# Patient Record
Sex: Female | Born: 1960
Health system: Southern US, Community
[De-identification: ages and names within clinical notes are randomized; demographics above are authoritative.]

## PROBLEM LIST (undated history)

## (undated) DIAGNOSIS — E039 Hypothyroidism, unspecified: Secondary | ICD-10-CM

## (undated) DIAGNOSIS — G4733 Obstructive sleep apnea (adult) (pediatric): Secondary | ICD-10-CM

## (undated) DIAGNOSIS — E119 Type 2 diabetes mellitus without complications: Secondary | ICD-10-CM

## (undated) DIAGNOSIS — E785 Hyperlipidemia, unspecified: Secondary | ICD-10-CM

## (undated) DIAGNOSIS — I10 Essential (primary) hypertension: Secondary | ICD-10-CM

## (undated) HISTORY — PX: SHOULDER SURGERY: SHX246

## (undated) HISTORY — DX: Type 2 diabetes mellitus without complications: E11.9

## (undated) HISTORY — DX: Essential (primary) hypertension: I10

## (undated) HISTORY — PX: TOTAL ABDOMINAL HYSTERECTOMY: SHX209

## (undated) HISTORY — DX: Hyperlipidemia, unspecified: E78.5

## (undated) HISTORY — DX: Hypothyroidism, unspecified: E03.9

## (undated) HISTORY — DX: Obstructive sleep apnea (adult) (pediatric): G47.33

## (undated) HISTORY — PX: ABDOMINAL HYSTERECTOMY: SHX81

---

## 1998-01-04 ENCOUNTER — Ambulatory Visit: Admission: RE | Admit: 1998-01-04 | Discharge: 1998-01-04 | Payer: Self-pay | Admitting: Obstetrics and Gynecology

## 1998-06-06 ENCOUNTER — Ambulatory Visit (HOSPITAL_COMMUNITY): Admission: RE | Admit: 1998-06-06 | Discharge: 1998-06-06 | Payer: Self-pay | Admitting: Obstetrics and Gynecology

## 1998-08-02 ENCOUNTER — Encounter: Payer: Self-pay | Admitting: Obstetrics and Gynecology

## 1998-08-02 ENCOUNTER — Ambulatory Visit (HOSPITAL_COMMUNITY): Admission: RE | Admit: 1998-08-02 | Discharge: 1998-08-02 | Payer: Self-pay | Admitting: Obstetrics and Gynecology

## 1999-04-30 ENCOUNTER — Other Ambulatory Visit: Admission: RE | Admit: 1999-04-30 | Discharge: 1999-04-30 | Payer: Self-pay | Admitting: Obstetrics and Gynecology

## 1999-06-27 ENCOUNTER — Other Ambulatory Visit: Admission: RE | Admit: 1999-06-27 | Discharge: 1999-06-27 | Payer: Self-pay | Admitting: Obstetrics and Gynecology

## 1999-06-27 ENCOUNTER — Encounter (INDEPENDENT_AMBULATORY_CARE_PROVIDER_SITE_OTHER): Payer: Self-pay

## 1999-07-23 ENCOUNTER — Other Ambulatory Visit: Admission: RE | Admit: 1999-07-23 | Discharge: 1999-07-23 | Payer: Self-pay | Admitting: Obstetrics and Gynecology

## 1999-12-23 ENCOUNTER — Encounter: Admission: RE | Admit: 1999-12-23 | Discharge: 1999-12-23 | Payer: Self-pay | Admitting: Obstetrics and Gynecology

## 1999-12-23 ENCOUNTER — Other Ambulatory Visit: Admission: RE | Admit: 1999-12-23 | Discharge: 1999-12-23 | Payer: Self-pay | Admitting: Obstetrics and Gynecology

## 1999-12-23 ENCOUNTER — Encounter: Payer: Self-pay | Admitting: Obstetrics and Gynecology

## 1999-12-24 ENCOUNTER — Other Ambulatory Visit: Admission: RE | Admit: 1999-12-24 | Discharge: 1999-12-24 | Payer: Self-pay | Admitting: Obstetrics and Gynecology

## 1999-12-24 ENCOUNTER — Encounter (INDEPENDENT_AMBULATORY_CARE_PROVIDER_SITE_OTHER): Payer: Self-pay | Admitting: Specialist

## 1999-12-25 ENCOUNTER — Encounter (INDEPENDENT_AMBULATORY_CARE_PROVIDER_SITE_OTHER): Payer: Self-pay

## 1999-12-25 ENCOUNTER — Observation Stay (HOSPITAL_COMMUNITY): Admission: RE | Admit: 1999-12-25 | Discharge: 1999-12-26 | Payer: Self-pay | Admitting: Obstetrics and Gynecology

## 2000-08-06 ENCOUNTER — Ambulatory Visit (HOSPITAL_COMMUNITY): Admission: RE | Admit: 2000-08-06 | Discharge: 2000-08-06 | Payer: Self-pay | Admitting: Obstetrics and Gynecology

## 2000-08-06 ENCOUNTER — Encounter: Payer: Self-pay | Admitting: Obstetrics and Gynecology

## 2000-12-16 ENCOUNTER — Encounter: Payer: Self-pay | Admitting: Cardiology

## 2000-12-16 ENCOUNTER — Inpatient Hospital Stay (HOSPITAL_COMMUNITY): Admission: AD | Admit: 2000-12-16 | Discharge: 2000-12-17 | Payer: Self-pay | Admitting: Cardiology

## 2001-01-14 ENCOUNTER — Ambulatory Visit (HOSPITAL_COMMUNITY): Admission: RE | Admit: 2001-01-14 | Discharge: 2001-01-14 | Payer: Self-pay | Admitting: Family Medicine

## 2001-01-14 ENCOUNTER — Encounter: Payer: Self-pay | Admitting: Family Medicine

## 2001-01-27 ENCOUNTER — Ambulatory Visit (HOSPITAL_COMMUNITY): Admission: RE | Admit: 2001-01-27 | Discharge: 2001-01-27 | Payer: Self-pay | Admitting: Family Medicine

## 2001-01-27 ENCOUNTER — Encounter: Payer: Self-pay | Admitting: Family Medicine

## 2001-02-16 ENCOUNTER — Other Ambulatory Visit: Admission: RE | Admit: 2001-02-16 | Discharge: 2001-02-16 | Payer: Self-pay | Admitting: Obstetrics and Gynecology

## 2001-07-02 ENCOUNTER — Ambulatory Visit (HOSPITAL_COMMUNITY): Admission: RE | Admit: 2001-07-02 | Discharge: 2001-07-02 | Payer: Self-pay | Admitting: Family Medicine

## 2001-08-16 ENCOUNTER — Emergency Department (HOSPITAL_COMMUNITY): Admission: EM | Admit: 2001-08-16 | Discharge: 2001-08-16 | Payer: Self-pay | Admitting: Emergency Medicine

## 2001-08-16 ENCOUNTER — Encounter: Payer: Self-pay | Admitting: Emergency Medicine

## 2001-08-31 ENCOUNTER — Encounter: Payer: Self-pay | Admitting: Surgery

## 2001-08-31 ENCOUNTER — Ambulatory Visit (HOSPITAL_COMMUNITY): Admission: RE | Admit: 2001-08-31 | Discharge: 2001-08-31 | Payer: Self-pay | Admitting: Surgery

## 2002-07-04 ENCOUNTER — Ambulatory Visit (HOSPITAL_COMMUNITY): Admission: RE | Admit: 2002-07-04 | Discharge: 2002-07-04 | Payer: Self-pay | Admitting: Obstetrics and Gynecology

## 2002-07-04 ENCOUNTER — Encounter: Payer: Self-pay | Admitting: Obstetrics and Gynecology

## 2002-07-26 ENCOUNTER — Other Ambulatory Visit: Admission: RE | Admit: 2002-07-26 | Discharge: 2002-07-26 | Payer: Self-pay | Admitting: Obstetrics and Gynecology

## 2003-08-07 ENCOUNTER — Other Ambulatory Visit: Admission: RE | Admit: 2003-08-07 | Discharge: 2003-08-07 | Payer: Self-pay | Admitting: Obstetrics and Gynecology

## 2003-08-07 ENCOUNTER — Ambulatory Visit (HOSPITAL_COMMUNITY): Admission: RE | Admit: 2003-08-07 | Discharge: 2003-08-07 | Payer: Self-pay | Admitting: Obstetrics and Gynecology

## 2003-08-17 ENCOUNTER — Encounter: Admission: RE | Admit: 2003-08-17 | Discharge: 2003-08-17 | Payer: Self-pay | Admitting: Obstetrics and Gynecology

## 2004-09-24 ENCOUNTER — Encounter: Admission: RE | Admit: 2004-09-24 | Discharge: 2004-09-24 | Payer: Self-pay | Admitting: Obstetrics and Gynecology

## 2004-12-25 ENCOUNTER — Ambulatory Visit (HOSPITAL_COMMUNITY): Admission: RE | Admit: 2004-12-25 | Discharge: 2004-12-25 | Payer: Self-pay | Admitting: Family Medicine

## 2006-02-10 ENCOUNTER — Encounter: Admission: RE | Admit: 2006-02-10 | Discharge: 2006-02-10 | Payer: Self-pay | Admitting: Obstetrics and Gynecology

## 2007-02-16 ENCOUNTER — Encounter: Admission: RE | Admit: 2007-02-16 | Discharge: 2007-02-16 | Payer: Self-pay | Admitting: Obstetrics and Gynecology

## 2008-05-11 ENCOUNTER — Encounter: Admission: RE | Admit: 2008-05-11 | Discharge: 2008-05-11 | Payer: Self-pay | Admitting: Obstetrics and Gynecology

## 2009-05-14 ENCOUNTER — Encounter: Admission: RE | Admit: 2009-05-14 | Discharge: 2009-05-14 | Payer: Self-pay | Admitting: Obstetrics and Gynecology

## 2009-09-18 ENCOUNTER — Ambulatory Visit (HOSPITAL_COMMUNITY): Admission: RE | Admit: 2009-09-18 | Discharge: 2009-09-18 | Payer: Self-pay | Admitting: Family Medicine

## 2009-10-31 ENCOUNTER — Encounter: Admission: RE | Admit: 2009-10-31 | Discharge: 2009-11-15 | Payer: Self-pay | Admitting: Orthopedic Surgery

## 2009-12-27 ENCOUNTER — Ambulatory Visit (HOSPITAL_COMMUNITY): Admission: RE | Admit: 2009-12-27 | Discharge: 2009-12-27 | Payer: Self-pay | Admitting: Orthopedic Surgery

## 2009-12-28 ENCOUNTER — Encounter: Admission: RE | Admit: 2009-12-28 | Discharge: 2010-03-28 | Payer: Self-pay | Admitting: Orthopedic Surgery

## 2010-06-12 ENCOUNTER — Encounter: Admission: RE | Admit: 2010-06-12 | Discharge: 2010-06-12 | Payer: Self-pay | Admitting: Obstetrics and Gynecology

## 2010-09-22 ENCOUNTER — Encounter: Payer: Self-pay | Admitting: Obstetrics and Gynecology

## 2010-11-19 LAB — COMPREHENSIVE METABOLIC PANEL
ALT: 17 U/L (ref 0–35)
AST: 22 U/L (ref 0–37)
Albumin: 4 g/dL (ref 3.5–5.2)
Alkaline Phosphatase: 102 U/L (ref 39–117)
BUN: 5 mg/dL — ABNORMAL LOW (ref 6–23)
CO2: 27 mEq/L (ref 19–32)
Calcium: 9.5 mg/dL (ref 8.4–10.5)
Chloride: 101 mEq/L (ref 96–112)
Creatinine, Ser: 0.62 mg/dL (ref 0.4–1.2)
GFR calc Af Amer: >60 mL/min (ref >60)
GFR calc non Af Amer: >60 mL/min (ref >60)
Glucose, Bld: 96 mg/dL (ref 70–99)
Potassium: 4.3 mEq/L (ref 3.5–5.1)
Sodium: 135 mEq/L (ref 135–145)
Total Bilirubin: 0.5 mg/dL (ref 0.3–1.2)
Total Protein: 6.6 g/dL (ref 6.0–8.3)

## 2010-11-19 LAB — URINALYSIS, ROUTINE W REFLEX MICROSCOPIC
Bilirubin Urine: NEGATIVE
Glucose, UA: NEGATIVE mg/dL
Hgb urine dipstick: NEGATIVE
Ketones, ur: NEGATIVE mg/dL
Nitrite: NEGATIVE
Protein, ur: NEGATIVE mg/dL
Specific Gravity, Urine: 1.003 — ABNORMAL LOW (ref 1.005–1.030)
Urobilinogen, UA: 0.2 mg/dL (ref 0.0–1.0)
pH: 5.5 (ref 5.0–8.0)

## 2010-11-19 LAB — CBC
HCT: 41.5 % (ref 36.0–46.0)
Hemoglobin: 14.1 g/dL (ref 12.0–15.0)
MCHC: 34 g/dL (ref 30.0–36.0)
MCV: 89.9 fL (ref 78.0–100.0)
Platelets: 179 10*3/uL (ref 150–400)
RBC: 4.61 MIL/uL (ref 3.87–5.11)
RDW: 13.7 % (ref 11.5–15.5)
WBC: 10.5 10*3/uL (ref 4.0–10.5)

## 2010-11-19 LAB — APTT: aPTT: 27 seconds (ref 24–37)

## 2010-11-19 LAB — PROTIME-INR
INR: 0.92 (ref 0.00–1.49)
Prothrombin Time: 12.3 seconds (ref 11.6–15.2)

## 2011-01-17 NOTE — Cardiovascular Report (Signed)
[place]. [facility]  Patient:    Brenda Meyer, Brenda Meyer                       MRN: [id number] Proc. Date: 12/17/00 Adm. Date:  [id number] Disc. Date: [id number] Attending:  Rollene Rotunda CC:         Juliette Alcide, M.D. [phone])  Rollene Rotunda, M.D. [facility]   Cardiac Catheterization  DATE OF BIRTH:  1961-01-07  PROCEDURE: 1. Left heart catheterization. 2. Ventriculography.  CARDIOLOGIST:  Lewayne Bunting, M.D.  DIAGNOSIS:  No evidence for flow-limiting coronary artery disease.  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 Lindaann Slough office with increased substernal chest pain.  The patient reports off and on substernal chest pain for the last four years but now presented with the addition of left arm pain.  However, her pain did resolve with sublingual nitroglycerin.  In the office, there were no EKG changes.  She has now been referred for diagnostic catheterization to assess her coronary anatomy.  DESCRIPTION OF PROCEDURE:  After informed consent was obtained, the patient was brought to the catheterization laboratory.  The right groin was sterilely prepped and draped, and 1% Lidocaine was used to infiltrate her right groin. A 6-French arterial sheath was placed using modified Seldinger technique. Subsequently, a 6-French JL4 and a right no-torque catheter were used to engage the left and right coronary systems, respectively.  Angiographic images were performed in various projections using manual injection of contrast. After coronary angiography, ventriculography was performed using a 6-French pigtail catheter.  The appropriate left-sided hemodynamics were obtained. Ventriculography was then performed in the RAO projection using power injection of contrast.   At the termination of the case, the catheter and sheath were removed an manual pressure applied until adequate hemostasis was achieved.   The patient tolerated the procedure well and was transferred to the floor in stable condition.  FINDINGS: 1. The left main coronary artery is within normal limits.  This is a large    caliber vessel. 2. The left anterior descending artery is a medium size caliber vessel    which wraps around the apex.  There is some diffuse blocking in the mid    segment no greater than 10 to 20%. 3. Left circumflex coronary artery was a moderate size vessel which gives    rise to two obtuse marginal vessels with no evidence of flow-limiting    coronary artery disease. 4. Right coronary artery is a large caliber vessel.  On initial injection,    there appeared to be stenosis of approximately 30 to 40% in the proximal    vessel.  However, this resolved after injection of 200 mcg of    nitroglycerin, and the patient appeared to have normal caliber vessel    with evidence of stenosis.  CONCLUSIONS:  No evidence for flow-limiting coronary artery disease.  RECOMMENDATIONS:  The results have been discussed with the patient and Dr. Antoine Poche.  The plan is to discharge the patient later today if there is no bleeding or complications of the right groin.  Results will be forwarded to Dr. Juliette Alcide. DD:  12/17/00 TD:  12/18/00 Job: [id number]

## 2011-01-17 NOTE — Discharge Summary (Signed)
[place]. [facility]  Patient:    Brenda Meyer, Brenda Meyer                       MRN: [id number] Adm. Date:  [id number] Disc. Date: [id number] Attending:  Rollene Rotunda Dictator:   Abelino Derrick, P.A.C. [facility] CC:         Dr. Reola Calkins, [facility]                  Referring Physician Discharge [place]  DISCHARGE DIAGNOSIS:  Chest pain, no significant coronary disease by catheterization this admission.  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  She presented on December 16, 2000, with chest pain.  Rollene Rotunda, M.D., felt that some of her symptoms were typical for angina.  She did have a positive risk factor history with smoking, a family history of coronary disease, and an asymptomatic right femoral bruit.  Her symptoms were relieved with nitroglycerin.  It was decided that she should be admitted for diagnostic catheterization.  HOSPITAL COURSE:  The patient was admitted to telemetry, started on heparin, aspirin, and beta blocker, and set up for diagnostic catheterization.  Her enzymes were negative.  Catheterization was done on December 17, 2000, by Lewayne Bunting, M.D., which revealed essentially normal coronaries and normal LV function.  It was felt that she could be discharged later on December 17, 2000, if her groin is stable.  DISCHARGE MEDICATIONS: 1. Prevacid as taken prior to admission. 2. Zoloft as taken prior to admission.  DISCHARGE LABORATORY DATA:  White count 11.7, hemoglobin 14.2, hematocrit 41.4, platelets 228.  Sodium 137, potassium 3.7, BUN 8, creatinine 0.6.  INR 0.9.  CK-MBs were negative  Chest x-ray reveals no acute disease.  EKG:  Sinus rhythm without acute changes.  DISPOSITION:  The patient is discharged in stable condition and will follow up with Dr. Reola Calkins in a week or two.  She has been instructed that she should not lift anything heavy  for 48 hours.  She does work the weekend option, although I suggested it may be best if she take the weekend off.  She will look into this. DD:  12/17/00 TD:  12/17/00 Job: [id number]

## 2011-01-17 NOTE — Op Note (Signed)
[facility]  Patient:    Brenda Meyer, Brenda Meyer                       MRN: [id number] Proc. Date: 12/25/99 Adm. Date:  [id number] Attending:  Morene Antu                           Operative Report  PREOPERATIVE DIAGNOSIS:       Menorrhagia, fibroid uterus.  POSTOPERATIVE DIAGNOSIS:      Menorrhagia, fibroid uterus.  OPERATION:                    Vaginal hysterectomy.  SURGEON:                      Sherry A. Rosalio Macadamia, M.D.  ASSISTANT:                    Silverio Lay, M.D.  ANESTHESIA:                   General anesthesia.  ESTIMATED BLOOD LOSS:  INDICATIONS:                  This is a 50 year old, gravida 2, para 2-0-0-2, woman who has had history of heavy menstrual periods for several years.  The patient underwent a D&C hysteroscopy with excision of submucosal fibroids in 1999. Initially the patients periods were normal, however, over the past six months, they have become heavier and more irregular.  The patient now complains of periods up to every two weeks.  Ultrasound was performed showing another several submucosal fibroids plus other fibroids within the uterus.  The patient was offered another conservative surgery by repeating the [facility] hysteroscopy, however, she has refused this surgery and requests hysterectomy.  Therefore, the patient is admitted for  vaginal hysterectomy.  FINDINGS:                     Eight week size anteflexed uterus with multiple fibroids present.  Left ovary adherent to posterior wall of the uterus. Otherwise, ovaries and tubes within normal limits.  DESCRIPTION OF PROCEDURE:     The patient is brought into the operating room and given adequate general anesthesia.  She was then placed in the dorsal lithotomy  position.  Her perineum and vagina were washed with Betadine.  The bladder was in-and-out catheterized.  The patient was draped in a sterile fashion.  A weighted speculum was placed within the  vagina.  The cervix was then grasped with two Bernette Mayers.  The cervix was infiltrated circumferentially with 1% Xylocaine with epinephrine.  The cervix was circumcised circumferentially.  The vaginal mucosa was dissected off of the cervix with blunt dissection.  The vaginal mucosa was dissected in the midline posteriorly.  It was very difficult to find the posterior cul-de-sac.  It was very high up on the cervix.  Once this was entered, the uterosacral ligaments were then clamped, cut, and suture ligated with 0 Vicryl ligatures.  The vaginal mucosa was dissected anteriorly off of the cervix to try to identify the anterior cul-de-sac.  This too was very high up on the cervix and above the lower uterine segment.  Stitch clamps were taken on either side of the cardinal ligaments.  They were clamped, cut, and suture ligated.  After several  bites in this fashion, the anterior peritoneum could be identified  and entered.  The retractor was placed within this space.  On alternating sides the cardinal ligaments were clamped, cut, and suture ligated with 0 Vicryl ligatures until the utero-ovarian ligaments could be identified.  These were still well up within the pelvis and did not retract into the vagina very well.  The uterus was then incised in its midline to try to expedite the last remaining tissue to be clamped.  The  left utero-ovarian ligament then was clamped.  It was cut.  The uterus could not be reotated completely as expected, so the right half of the uterus was excised. t this point, the last area of the left utero-ovarian ligaments could be identified. The difficulty was that some of the left ovary was adherent to the posterior aspect of the uterus.  This adhesion was clamped, cut, and suture ligated.  This freed up the utero-ovarian ligament.  This was clamped, cut, and suture ligated with 0 Vicryl ligature and then free tied with #1 Vicryl ligature.  There was a  small amount of bleeding near the left ovary.  This was closed with 0 Vicryl figure-of-eight stitch.  Adequate hemostasis was present throughout.  The peritoneum was very high up within the pelvis.  It was felt that it could not be closed safely.  The posterior vaginal cuff was closed using 0 Vicryl in a running whipstitch attempting to close the vaginal cuff to the peritoneum for hemostasis. This was done between the uterosacral ligaments.  Adequate hemostasis was present. During this last part of the procedure, a Foley catheter was placed within the bladder.  The vaginal mucosa was then closed in a vertical incision using 0 Vicryl figure-of-eight sutures.  Adequate hemostasis was present.  The patient was taken out of the dorsal lithotomy position.  She was awakened, she was extubated, and she was removed from the operating table to a stretcher in stable condition. Complications were none.  Estimated blood loss 200 cc. DD:  12/25/99 TD:  12/25/99 Job: [id number]

## 2011-01-21 ENCOUNTER — Encounter: Payer: Self-pay | Admitting: Pulmonary Disease

## 2011-01-21 ENCOUNTER — Ambulatory Visit (INDEPENDENT_AMBULATORY_CARE_PROVIDER_SITE_OTHER): Payer: 59 | Admitting: Pulmonary Disease

## 2011-01-21 VITALS — BP 142/70 | HR 63 | Temp 98.0°F | Ht 67.0 in | Wt 215.6 lb

## 2011-01-21 DIAGNOSIS — R0683 Snoring: Secondary | ICD-10-CM

## 2011-01-21 DIAGNOSIS — R0989 Other specified symptoms and signs involving the circulatory and respiratory systems: Secondary | ICD-10-CM

## 2011-01-21 DIAGNOSIS — R0609 Other forms of dyspnea: Secondary | ICD-10-CM

## 2011-01-21 DIAGNOSIS — G4733 Obstructive sleep apnea (adult) (pediatric): Secondary | ICD-10-CM | POA: Insufficient documentation

## 2011-01-21 NOTE — Assessment & Plan Note (Signed)
The pt's history is suspicious for sleep disordered breathing, but I also think she has issues with sleep hygiene and possibly some insomnia.  She is overweight, has fragmented sleep, and nonrestorative sleep.  I have had a long discussion with the pt about sleep apnea, including its impact on QOL and CV health.  I think she needs to have a sleep study, and the pt agrees.

## 2011-01-21 NOTE — Progress Notes (Signed)
  Subjective:    Patient ID: Brenda Meyer, female    DOB: 11-10-1960, 50 y.o.   MRN: [id number]  HPI The pt is a 50y/o female who comes in today as a self referral for evaluation of possible osa.  Her history is significant for the following: -loud snoring and abnl breathing pattern during sleep per daughter -frequent awakenings during the night, and nonrestorative sleep -intermittant mild sleep pressure during the day with inactivity, but epworth only 2. -denies napping during day -no sleepiness issues watching tv in evening, or with driving -  Weight neutral past 2 yrs.     Sleep Questionnaire: What time do you typically go to bed?( Between what hours) 2200 to 2300 How long does it take you to fall asleep? varies How many times during the night do you wake up? 5 What time do you get out of bed to start your day? 0800 Do you drive or operate heavy machinery in your occupation? No How much has your weight changed (up or down) over the past two years? (In pounds) 0 oz (0 kg) Have you ever had a sleep study before? No Do you currently use CPAP? No Do you wear oxygen at any time? No    Review of Systems  Constitutional: Negative for fever and unexpected weight change.  HENT: Negative for ear pain, nosebleeds, congestion, sore throat, rhinorrhea, sneezing, trouble swallowing, dental problem, postnasal drip and sinus pressure.   Eyes: Negative for redness and itching.  Respiratory: Positive for shortness of breath. Negative for cough, chest tightness and wheezing.   Cardiovascular: Negative for palpitations and leg swelling.  Gastrointestinal: Negative for nausea and vomiting.  Genitourinary: Negative for dysuria.  Musculoskeletal: Negative for joint swelling.  Skin: Negative for rash.  Neurological: Negative for headaches.  Hematological: Does not bruise/bleed easily.  Psychiatric/Behavioral: Negative for dysphoric mood. The patient is not nervous/anxious.        Objective:   Physical  Exam Constitutional:  Overweight female, no acute distress  HENT:  Nares patent without discharge, mild turbinate hypertrophy  Oropharynx without exudate, palate and uvula are moderately elongated  Eyes:  Perrla, eomi, no scleral icterus  Neck:  No JVD, no TMG  Cardiovascular:  Normal rate, regular rhythm, no rubs or gallops.  No murmurs        Intact distal pulses  Pulmonary :  Normal breath sounds, no stridor or respiratory distress   No rales, rhonchi, or wheezing  Abdominal:  Soft, nondistended, bowel sounds present.  No tenderness noted.   Musculoskeletal:  No lower extremity edema noted.  Lymph Nodes:  No cervical lymphadenopathy noted  Skin:  No cyanosis noted  Neurologic:  Alert, appropriate, moves all 4 extremities without obvious deficit.         Assessment & Plan:

## 2011-01-21 NOTE — Patient Instructions (Signed)
Will schedule for home sleep testing, and will call when results available Work on weight loss Try to stay off computer at night after 10pm

## 2011-02-07 ENCOUNTER — Ambulatory Visit (INDEPENDENT_AMBULATORY_CARE_PROVIDER_SITE_OTHER): Payer: 59 | Admitting: Pulmonary Disease

## 2011-02-07 ENCOUNTER — Telehealth: Payer: Self-pay | Admitting: Pulmonary Disease

## 2011-02-07 DIAGNOSIS — G4733 Obstructive sleep apnea (adult) (pediatric): Secondary | ICD-10-CM

## 2011-02-07 NOTE — Progress Notes (Signed)
The pt underwent home sleep testing with a type 3 monitoring device.  Airflow, effort, oxygen saturation, and pulse were all measured during the night.  Her raw data and tracings have been reviewed with the following findings:  1) flow evaluation period of 7hrs and noted 2) the pt was found to have 125 apneas and 91 hypopneas, for an AHI 29/hr 3) there was oxygen desaturation to 75%, and the pt spent less than or equal to a saturation of 88%.

## 2011-02-07 NOTE — Telephone Encounter (Signed)
Brenda Meyer, pt needs ov to discuss sleep study results.

## 2011-02-07 NOTE — Assessment & Plan Note (Signed)
The pt has moderate osa by her home sleep testing.  Will need ov to discuss treatment options.

## 2011-02-11 NOTE — Telephone Encounter (Signed)
Pt called back.  Pt scheduled to see KC thurs 6/21 at 9:45 am.

## 2011-02-11 NOTE — Telephone Encounter (Signed)
[id number] 

## 2011-02-20 ENCOUNTER — Encounter: Payer: Self-pay | Admitting: Pulmonary Disease

## 2011-02-20 ENCOUNTER — Ambulatory Visit (INDEPENDENT_AMBULATORY_CARE_PROVIDER_SITE_OTHER): Payer: 59 | Admitting: Pulmonary Disease

## 2011-02-20 VITALS — BP 130/78 | HR 59 | Temp 98.1°F | Ht 67.0 in | Wt 217.4 lb

## 2011-02-20 DIAGNOSIS — G4733 Obstructive sleep apnea (adult) (pediatric): Secondary | ICD-10-CM

## 2011-02-20 NOTE — Progress Notes (Signed)
  Subjective:    Patient ID: Brenda Meyer, female    DOB: 15-Mar-1961, 50 y.o.   MRN: [id number]  HPI The pt comes in today for f/u of her recent sleep study.  She was found to have an AHI of 29/hr, but did not sleep the entire monitoring period.  Therefore, her AHI is really a lot worse than this.  I have reviewed the study in detail with her, and answered all of her questions.    Review of Systems  Constitutional: Negative for fever and unexpected weight change.  HENT: Negative for ear pain, nosebleeds, congestion, sore throat, rhinorrhea, sneezing, trouble swallowing, dental problem, postnasal drip and sinus pressure.   Eyes: Negative for redness and itching.  Respiratory: Negative for cough, chest tightness, shortness of breath and wheezing.   Cardiovascular: Negative for palpitations and leg swelling.  Gastrointestinal: Negative for nausea and vomiting.  Genitourinary: Negative for dysuria.  Musculoskeletal: Negative for joint swelling.  Skin: Negative for rash.  Neurological: Negative for headaches.  Hematological: Does not bruise/bleed easily.  Psychiatric/Behavioral: Negative for dysphoric mood. The patient is not nervous/anxious.        Objective:   Physical Exam Obese female in nad Nares without obvious discharge or purulence LE without edema, no cyanosis Alert, oriented, moves all 4 extrem.        Assessment & Plan:

## 2011-02-20 NOTE — Patient Instructions (Signed)
Will start you on cpap.  Please call if having issues with tolerance Work on weight loss followup with me in 5 weeks.

## 2011-02-26 NOTE — Assessment & Plan Note (Signed)
The pt has at least moderate osa by her sleep study, and is clearly symptomatic from this.  I have discussed the various treatment options with her, including weight loss, surgery, dental appliance, and cpap.  I really think cpap use while working on weight loss gives her the best chance of successful treatment.  She is willing to give this a try.

## 2011-02-28 ENCOUNTER — Encounter: Payer: Self-pay | Admitting: Pulmonary Disease

## 2011-04-03 ENCOUNTER — Encounter: Payer: Self-pay | Admitting: Pulmonary Disease

## 2011-04-03 ENCOUNTER — Ambulatory Visit (INDEPENDENT_AMBULATORY_CARE_PROVIDER_SITE_OTHER): Payer: 59 | Admitting: Pulmonary Disease

## 2011-04-03 VITALS — BP 110/68 | HR 61 | Temp 98.2°F | Ht 67.0 in | Wt 218.4 lb

## 2011-04-03 DIAGNOSIS — G4733 Obstructive sleep apnea (adult) (pediatric): Secondary | ICD-10-CM

## 2011-04-03 NOTE — Progress Notes (Signed)
  Subjective:    Patient ID: Brenda Meyer, female    DOB: April 10, 1961, 50 y.o.   MRN: [id number]  HPI The pt comes in today for f/u of her osa.  She was started on cpap last visit, and has done well with the device.  She is wearing compliantly, and denies any pressure or mask issues.  She feels she is sleeping better, and has improved daytime alertness.    Review of Systems  Constitutional: Negative for fever and unexpected weight change.  HENT: Positive for ear pain and dental problem. Negative for nosebleeds, congestion, sore throat, rhinorrhea, sneezing, trouble swallowing, postnasal drip and sinus pressure.   Eyes: Negative for redness and itching.  Respiratory: Negative for cough, chest tightness, shortness of breath and wheezing.   Cardiovascular: Negative for palpitations and leg swelling.  Gastrointestinal: Negative for nausea and vomiting.  Genitourinary: Negative for dysuria.  Musculoskeletal: Negative for joint swelling.  Skin: Negative for rash.  Neurological: Negative for headaches.  Hematological: Does not bruise/bleed easily.  Psychiatric/Behavioral: Negative for dysphoric mood. The patient is not nervous/anxious.        Objective:   Physical Exam Ow female in nad No skin breakdown or pressure necrosis from cpap mask LE without edema, no cyanosis  Appears alert, not sleepy, moves all 4        Assessment & Plan:

## 2011-04-03 NOTE — Patient Instructions (Signed)
Will have your machine set on auto mode for 2 weeks to optimize your pressure.  Will let you know the results. Work on weight loss If doing well, followup with me in 6mos

## 2011-04-09 NOTE — Assessment & Plan Note (Signed)
The pt is doing well with cpap, and feels it has improved her symptoms.  No mask or pressure issues.  Need to optimize pressure for her on auto mode.   I have also encouraged her to work on weight loss.  Care Plan:  At this point, will arrange for the patient's machine to be changed over to auto mode for 2 weeks to optimize their pressure.  I will review the downloaded data once sent by dme, and also evaluate for compliance, leaks, and residual osa.  I will call the patient and dme to discuss the results, and have the patient's machine set appropriately.  This will serve as the pt's cpap pressure titration.

## 2011-05-06 ENCOUNTER — Other Ambulatory Visit: Payer: Self-pay | Admitting: Pulmonary Disease

## 2011-05-06 ENCOUNTER — Telehealth: Payer: Self-pay | Admitting: Pulmonary Disease

## 2011-05-06 DIAGNOSIS — G4733 Obstructive sleep apnea (adult) (pediatric): Secondary | ICD-10-CM

## 2011-05-06 NOTE — Telephone Encounter (Signed)
Called, spoke with pt.  States she has been on the auto set for almost 3 wks.  This is not working for her, states she is having a lot of leaking which is waking her up in the middle of the night.  She is requesting to go back on a set pressure of 10.  Dr. Shelle Iron, pls advise.  Have you received the auto download yet?

## 2011-05-06 NOTE — Telephone Encounter (Signed)
Let her know that her download showed that 16cm of pressure is optimal.  We can get set on this and see how she does.  Let us know if issues. i will send order to [facility].

## 2011-05-06 NOTE — Telephone Encounter (Signed)
Pt aware of kc recs. Pt verbalized understanding and had no questions 

## 2011-07-17 ENCOUNTER — Other Ambulatory Visit: Payer: Self-pay | Admitting: Obstetrics

## 2011-07-17 DIAGNOSIS — Z1231 Encounter for screening mammogram for malignant neoplasm of breast: Secondary | ICD-10-CM

## 2011-08-14 ENCOUNTER — Ambulatory Visit
Admission: RE | Admit: 2011-08-14 | Discharge: 2011-08-14 | Disposition: A | Discharge status: Home / Self Care | Payer: 59 | Source: Ambulatory Visit | Attending: Obstetrics | Admitting: Obstetrics

## 2011-08-14 DIAGNOSIS — Z1231 Encounter for screening mammogram for malignant neoplasm of breast: Secondary | ICD-10-CM

## 2011-08-21 ENCOUNTER — Other Ambulatory Visit (HOSPITAL_COMMUNITY): Payer: Self-pay | Admitting: Chiropractic Medicine

## 2011-08-21 DIAGNOSIS — M549 Dorsalgia, unspecified: Secondary | ICD-10-CM

## 2011-08-22 ENCOUNTER — Other Ambulatory Visit (HOSPITAL_COMMUNITY): Payer: Self-pay | Admitting: *Deleted

## 2011-08-22 DIAGNOSIS — R52 Pain, unspecified: Secondary | ICD-10-CM

## 2011-08-27 ENCOUNTER — Other Ambulatory Visit (HOSPITAL_COMMUNITY): Payer: 59

## 2011-09-04 ENCOUNTER — Ambulatory Visit (HOSPITAL_COMMUNITY)
Admission: RE | Admit: 2011-09-04 | Discharge: 2011-09-04 | Disposition: A | Discharge status: Home / Self Care | Payer: 59 | Source: Ambulatory Visit | Attending: Chiropractic Medicine | Admitting: Chiropractic Medicine

## 2011-09-04 DIAGNOSIS — M546 Pain in thoracic spine: Secondary | ICD-10-CM | POA: Insufficient documentation

## 2011-09-04 DIAGNOSIS — IMO0002 Reserved for concepts with insufficient information to code with codable children: Secondary | ICD-10-CM | POA: Insufficient documentation

## 2011-09-04 DIAGNOSIS — R52 Pain, unspecified: Secondary | ICD-10-CM

## 2011-10-06 ENCOUNTER — Ambulatory Visit: Payer: 59 | Admitting: Pulmonary Disease

## 2011-10-17 ENCOUNTER — Encounter (HOSPITAL_COMMUNITY): Admission: RE | Payer: Self-pay | Source: Ambulatory Visit

## 2011-10-17 ENCOUNTER — Ambulatory Visit (HOSPITAL_COMMUNITY): Admission: RE | Admit: 2011-10-17 | Payer: 59 | Source: Ambulatory Visit | Admitting: Gastroenterology

## 2011-10-17 SURGERY — COLONOSCOPY
Anesthesia: Moderate Sedation

## 2011-10-20 ENCOUNTER — Encounter (HOSPITAL_COMMUNITY): Payer: Self-pay | Admitting: Gastroenterology

## 2011-10-20 ENCOUNTER — Ambulatory Visit (INDEPENDENT_AMBULATORY_CARE_PROVIDER_SITE_OTHER): Payer: 59 | Admitting: Pulmonary Disease

## 2011-10-20 VITALS — BP 122/70 | HR 86 | Temp 98.0°F | Ht 67.0 in | Wt 218.6 lb

## 2011-10-20 DIAGNOSIS — G4733 Obstructive sleep apnea (adult) (pediatric): Secondary | ICD-10-CM

## 2011-10-20 NOTE — Patient Instructions (Signed)
Will turn cpap down to 14cm, and see if this helps with improved compliance Work on weight loss followup with me in one year if doing well, but please call if you continue to have issues with cpap that do not resolve

## 2011-10-20 NOTE — Progress Notes (Signed)
  Subjective:    Patient ID: Brenda Meyer, female    DOB: 31-Oct-1960, 51 y.o.   MRN: [id number]  HPI Patient comes in today for followup of her known objective sleep apnea.  She has been wearing CPAP fairly compliantly, but not all night on a consistent basis.  She complains of restlessness associated with her chronic back pain, and this sometimes interferes with its use.  She also has some mask leaks at times since increasing the pressure to 16 cm.  When she wears the device more consistently, she does feel that she sleeps better.   Review of Systems  Constitutional: Negative for fever and unexpected weight change.  HENT: Negative for ear pain, nosebleeds, congestion, sore throat, rhinorrhea, sneezing, trouble swallowing, dental problem, postnasal drip and sinus pressure.   Eyes: Negative for redness and itching.  Respiratory: Negative for cough, chest tightness, shortness of breath and wheezing.   Cardiovascular: Positive for leg swelling. Negative for palpitations.  Gastrointestinal: Negative for nausea and vomiting.  Genitourinary: Negative for dysuria.  Musculoskeletal: Positive for back pain. Negative for joint swelling.  Skin: Negative for rash.  Neurological: Negative for headaches.  Hematological: Does not bruise/bleed easily.  Psychiatric/Behavioral: Negative for dysphoric mood. The patient is nervous/anxious.        Objective:   Physical Exam Overweight female in no acute distress No skin breakdown or pressure necrosis from the CPAP mask Lower extremities without edema, no cyanosis Alert and oriented, does not appear to be sleepy, moves all 4 extremities.       Assessment & Plan:

## 2011-10-20 NOTE — Assessment & Plan Note (Signed)
The patient has adapted fairly well to CPAP, but is still not wearing all night every night.  She blames part of this on her restlessness associated with back pain, but also has had some mask leak since the pressure has been increased.  I would like to try decreasing her pressure a tiny bit, and see if her tolerance and sleep improved.  I have also encouraged her to work aggressively on weight loss.  She will followup with me in one year.

## 2011-11-17 ENCOUNTER — Encounter (HOSPITAL_COMMUNITY)
Admission: RE | Disposition: A | Discharge status: Home / Self Care | Payer: Self-pay | Source: Ambulatory Visit | Attending: Gastroenterology

## 2011-11-17 ENCOUNTER — Encounter (HOSPITAL_COMMUNITY): Payer: Self-pay | Admitting: *Deleted

## 2011-11-17 ENCOUNTER — Ambulatory Visit (HOSPITAL_COMMUNITY)
Admission: RE | Admit: 2011-11-17 | Discharge: 2011-11-17 | Disposition: A | Discharge status: Home / Self Care | Payer: 59 | Source: Ambulatory Visit | Attending: Gastroenterology | Admitting: Gastroenterology

## 2011-11-17 DIAGNOSIS — G4730 Sleep apnea, unspecified: Secondary | ICD-10-CM | POA: Insufficient documentation

## 2011-11-17 DIAGNOSIS — R198 Other specified symptoms and signs involving the digestive system and abdomen: Secondary | ICD-10-CM | POA: Insufficient documentation

## 2011-11-17 DIAGNOSIS — E785 Hyperlipidemia, unspecified: Secondary | ICD-10-CM | POA: Insufficient documentation

## 2011-11-17 HISTORY — PX: COLONOSCOPY: SHX5424

## 2011-11-17 SURGERY — COLONOSCOPY
Anesthesia: Moderate Sedation

## 2011-11-17 MED ORDER — FENTANYL CITRATE 0.05 MG/ML IJ SOLN
INTRAMUSCULAR | Status: DC | PRN
  Administered 2011-11-17 (×5): 25 ug via INTRAVENOUS

## 2011-11-17 MED ORDER — SODIUM CHLORIDE 0.9 % IV SOLN
Freq: Once | INTRAVENOUS | Status: AC
  Administered 2011-11-17: 500 mL via INTRAVENOUS

## 2011-11-17 MED ORDER — MIDAZOLAM HCL 10 MG/2ML IJ SOLN
INTRAMUSCULAR | Status: AC
  Filled 2011-11-17: qty 4

## 2011-11-17 MED ORDER — FENTANYL CITRATE 0.05 MG/ML IJ SOLN
INTRAMUSCULAR | Status: AC
  Filled 2011-11-17: qty 4

## 2011-11-17 MED ORDER — MIDAZOLAM HCL 10 MG/2ML IJ SOLN
INTRAMUSCULAR | Status: DC | PRN
  Administered 2011-11-17 (×3): 2 mg via INTRAVENOUS
  Administered 2011-11-17 (×2): 1 mg via INTRAVENOUS

## 2011-11-17 NOTE — H&P (Signed)
Brenda Meyer is an 51 y.o. female.   Chief Complaint: Colorectal cancer screening.  HPI: Patient is here for a screening colonoscopy.   Past Medical History  Diagnosis Date  . Hyperlipidemia   . Borderline diabetes   . Sleep apnea     Past Surgical History  Procedure Date  . Shoulder surgery     right  . Colonoscopy 10/17/2011    Procedure: COLONOSCOPY;  Surgeon: Charna Elizabeth, MD;  Location: [facility];  Service: Endoscopy;  Laterality: N/A;    Family History  Problem Relation Age of Onset  . Heart disease Mother   . Heart disease Father   . Brain cancer Brother   . Lung cancer Sister    Social History:  reports that she quit smoking about 13 months ago. Her smoking use included Cigarettes. She has a 30 pack-year smoking history. She does not have any smokeless tobacco history on file. She reports that she does not drink alcohol. Her drug history not on file.  Allergies: No Known Allergies  Medications Prior to Admission  Medication Dose Route Frequency Provider Last Rate Last Dose  . 0.9 %  sodium chloride infusion   Intravenous Once Charna Elizabeth, MD 20 mL/hr at 11/17/11 1543 500 mL at 11/17/11 1543   Medications Prior to Admission  Medication Sig Dispense Refill  . metFORMIN (GLUCOPHAGE) 500 MG tablet Take 500 mg by mouth daily.        . rosuvastatin (CRESTOR) 10 MG tablet Take 10 mg by mouth daily.          No results found for this or any previous visit (from the past 48 hour(s)). No results found.  Review of Systems  Constitutional: Negative.   HENT: Negative.   Eyes: Positive for redness.  Respiratory: Negative.   Cardiovascular: Negative.   Gastrointestinal: Negative.   Genitourinary: Negative.   Musculoskeletal: Negative.   Skin: Negative.   Neurological: Negative.   Psychiatric/Behavioral: Negative.     Blood pressure 130/79, pulse 67, temperature 98.2 F (36.8 C), temperature source Oral, resp. rate 14, height 5\' 8"  (1.727 m), weight 95.255 kg  (210 lb), SpO2 97.00%. Physical Exam  Constitutional: She is oriented to person, place, and time. She appears well-developed and well-nourished.  HENT:  Head: Normocephalic.  Eyes: EOM are normal. Pupils are equal, round, and reactive to light.  Neck: Normal range of motion. Neck supple.  Cardiovascular: Normal rate and regular rhythm.   Respiratory: Effort normal and breath sounds normal.  GI: Soft. Bowel sounds are normal.  Musculoskeletal: Normal range of motion.  Neurological: She is alert and oriented to person, place, and time. She has normal reflexes.  Skin: Skin is warm and dry.  Psychiatric: She has a normal mood and affect. Her behavior is normal. Judgment and thought content normal.     Assessment/Plan Colorectal cancer screening: Proceed with a colonoscopy at this time.  Jolee Critcher 11/17/2011, 3:45 PM

## 2011-11-17 NOTE — Discharge Instructions (Signed)
Colonoscopy Care After Read the instructions outlined below and refer to this sheet in the next few weeks. These discharge instructions provide you with general information on caring for yourself after you leave the hospital. Your doctor may also give you specific instructions. While your treatment has been planned according to the most current medical practices available, unavoidable complications occasionally occur. If you have any problems or questions after discharge, call your doctor. HOME CARE INSTRUCTIONS ACTIVITY:  You may resume your regular activity, but move at a slower pace for the next 24 hours.   Take frequent rest periods for the next 24 hours.   Walking will help get rid of the air and reduce the bloated feeling in your belly (abdomen).   No driving for 24 hours (because of the medicine (anesthesia) used during the test).   You may shower.   Do not sign any important legal documents or operate any machinery for 24 hours (because of the anesthesia used during the test).  NUTRITION:  Drink plenty of fluids.   You may resume your normal diet as instructed by your doctor.   Begin with a light meal and progress to your normal diet. Heavy or fried foods are harder to digest and may make you feel sick to your stomach (nauseated).   Avoid alcoholic beverages for 24 hours or as instructed.  MEDICATIONS:  You may resume your normal medications unless your doctor tells you otherwise.  WHAT TO EXPECT TODAY:  Some feelings of bloating in the abdomen.   Passage of more gas than usual.   Spotting of blood in your stool or on the toilet paper.  IF YOU HAD POLYPS REMOVED DURING THE COLONOSCOPY:  No aspirin products for 7 days or as instructed.   No alcohol for 7 days or as instructed.   Eat a soft diet for the next 24 hours.  FINDING OUT THE RESULTS OF YOUR TEST Not all test results are available during your visit. If your test results are not back during the visit, make an  appointment with your caregiver to find out the results. Do not assume everything is normal if you have not heard from your caregiver or the medical facility. It is important for you to follow up on all of your test results.  SEEK IMMEDIATE MEDICAL CARE IF:  You have more than a spotting of blood in your stool.   Your belly is swollen (abdominal distention).   You are nauseated or vomiting.   You have a fever.   You have abdominal pain or discomfort that is severe or gets worse throughout the day.  Document Released: 04/01/2004 Document Revised: 08/07/2011 Document Reviewed: 03/30/2008 ExitCare Patient Information 2012 [employer]. 

## 2011-11-17 NOTE — Op Note (Signed)
[facility] [address] [place], [place]  [address]  OPERATIVE PROCEDURE REPORT  PATIENT:  Brenda Meyer, Brenda Meyer  MR#:  [id number] BIRTHDATE:  03-Jan-1961  GENDER:  female ENDOSCOPIST:  Dr. Lorenza Burton, MD ASSISTANT:  Bary Leriche, Angelique Blonder, RN.  PROCEDURE DATE:  11/17/2011 PRE-PROCEDURE PREPERATION:  Moviprep was taken as instructed (32 ounces the night prior to the procedure and 32 ounces the morning of the procedure).  The patient was fasted for four hours prior to the procedure. PRE-PROCEDURE PHYSICAL:  Patient has stable vital signs. Neck is supple. There is no JVD, thyromegaly or LAD. Chest clear to auscultation. S1 and S2 regular. Abdomen soft, obese, non-distended, non-tender with NABS. PROCEDURE:  Diagnostic colonoscopy. ASA CLASS:  Class II INDICATIONS:  1) CRC screening 2) Change in bowel habits. MEDICATIONS:  Fentanyl 125 mcg & Versed 8 mg IV.  DESCRIPTION OF PROCEDURE: After the risks, benefits, and alternatives of the procedure were thoroughly explained [including a 10% missed rate of cancer and polyps], informed consent was obtained.  Digital rectal exam was performed.  The Pentax Colonoscope [id number] was introduced through the anus and advanced to the ascending colon, limited by a very redundant colon. The quality of the prep was poor, using MiraLax. Multiple washes were done. Small lesions could be missed. The instrument was then slowly withdrawn as the colon was fully examined. <<PROCEDUREIMAGES>>  FINDINGS:  There was a lot of debris in the stool. Inspite, of multiple washes, visualization was not adqeuate. The procedure was aborted i the proximal ascending colon. No masses, polyps, diverticula or AVM's were noted. The ICV were identified and photographed. The patient tolerated the procedure without immediate complications.  The scope was then withdrawn from the patient and the procedure terminated.  IMPRESSION:  Very poor prep with a lot  of residual stool in the colon-procedure in the proximal right colon.  RECOMMENDATIONS:  Reprep and redo after a 5 day prep.  REPEAT EXAM:   ASAP; in case the patient has any abnormal GI symptoms in the interim, she should contact the office immediately for further recommendations.  DISCHARGE INSTRUCTIONS:  Standard discharge instructions given.  ______________________________ Dr. Lorenza Burton, MD CPT CODES:  [phone] DIAGNOSIS CODES:  787.99, V76.51 CC:  Rudi Heap, M.D.  n. eSIGNED:   Dr. Lorenza Burton at 11/17/2011 05:17 PM  Marlane Hatcher, [phone]

## 2011-11-18 ENCOUNTER — Encounter (HOSPITAL_COMMUNITY): Payer: Self-pay | Admitting: Gastroenterology

## 2012-02-10 ENCOUNTER — Ambulatory Visit: Payer: 59 | Attending: Family Medicine | Admitting: Physical Therapy

## 2012-02-10 DIAGNOSIS — M546 Pain in thoracic spine: Secondary | ICD-10-CM | POA: Insufficient documentation

## 2012-02-10 DIAGNOSIS — M545 Low back pain, unspecified: Secondary | ICD-10-CM | POA: Insufficient documentation

## 2012-02-10 DIAGNOSIS — IMO0001 Reserved for inherently not codable concepts without codable children: Secondary | ICD-10-CM | POA: Insufficient documentation

## 2012-02-10 DIAGNOSIS — R5381 Other malaise: Secondary | ICD-10-CM | POA: Insufficient documentation

## 2012-02-13 ENCOUNTER — Encounter: Payer: 59 | Admitting: Physical Therapy

## 2012-02-16 ENCOUNTER — Ambulatory Visit: Payer: 59 | Admitting: Physical Therapy

## 2012-02-19 ENCOUNTER — Ambulatory Visit: Payer: 59 | Admitting: Physical Therapy

## 2012-02-23 ENCOUNTER — Encounter: Payer: 59 | Admitting: Physical Therapy

## 2012-02-26 ENCOUNTER — Encounter: Payer: 59 | Admitting: Physical Therapy

## 2012-05-31 ENCOUNTER — Other Ambulatory Visit (HOSPITAL_COMMUNITY): Payer: Self-pay | Admitting: Neurological Surgery

## 2012-05-31 DIAGNOSIS — M545 Low back pain, unspecified: Secondary | ICD-10-CM

## 2012-05-31 DIAGNOSIS — M479 Spondylosis, unspecified: Secondary | ICD-10-CM

## 2012-06-03 ENCOUNTER — Ambulatory Visit (HOSPITAL_COMMUNITY)
Admission: RE | Admit: 2012-06-03 | Discharge: 2012-06-03 | Disposition: A | Discharge status: Home / Self Care | Payer: 59 | Source: Ambulatory Visit | Attending: Neurological Surgery | Admitting: Neurological Surgery

## 2012-06-03 DIAGNOSIS — M5126 Other intervertebral disc displacement, lumbar region: Secondary | ICD-10-CM | POA: Insufficient documentation

## 2012-06-03 DIAGNOSIS — M479 Spondylosis, unspecified: Secondary | ICD-10-CM

## 2012-06-03 DIAGNOSIS — M545 Low back pain, unspecified: Secondary | ICD-10-CM | POA: Insufficient documentation

## 2012-07-20 ENCOUNTER — Other Ambulatory Visit: Payer: Self-pay | Admitting: Obstetrics

## 2012-07-20 DIAGNOSIS — Z1231 Encounter for screening mammogram for malignant neoplasm of breast: Secondary | ICD-10-CM

## 2012-08-31 ENCOUNTER — Ambulatory Visit
Admission: RE | Admit: 2012-08-31 | Discharge: 2012-08-31 | Disposition: A | Discharge status: Home / Self Care | Payer: 59 | Source: Ambulatory Visit | Attending: Obstetrics | Admitting: Obstetrics

## 2012-08-31 DIAGNOSIS — Z1231 Encounter for screening mammogram for malignant neoplasm of breast: Secondary | ICD-10-CM

## 2012-10-19 ENCOUNTER — Ambulatory Visit: Payer: 59 | Admitting: Pulmonary Disease

## 2012-11-04 ENCOUNTER — Ambulatory Visit (INDEPENDENT_AMBULATORY_CARE_PROVIDER_SITE_OTHER): Payer: 59 | Admitting: Pulmonary Disease

## 2012-11-04 ENCOUNTER — Encounter: Payer: Self-pay | Admitting: Pulmonary Disease

## 2012-11-04 VITALS — BP 110/70 | HR 66 | Temp 98.2°F | Ht 67.0 in | Wt 192.2 lb

## 2012-11-04 DIAGNOSIS — G4733 Obstructive sleep apnea (adult) (pediatric): Secondary | ICD-10-CM

## 2012-11-04 NOTE — Assessment & Plan Note (Signed)
The patient is doing well with CPAP, and is losing a significant amount of weight with her current program.  I have asked her to continue on the device, but to let us know if her pressure feels too high since her weight loss.  We can optimize her pressure again at home on the automatic setting.  I have also asked her to keep up with her mass changes and supplies.

## 2012-11-04 NOTE — Patient Instructions (Addendum)
Continue on cpap, and keep up with mask changes and supplies. Continue with weight loss, and let me know if you think the pressure is too high followup with me in one year

## 2012-11-04 NOTE — Progress Notes (Signed)
  Subjective:    Patient ID: Brenda Meyer, female    DOB: December 14, 1960, 52 y.o.   MRN: [id number]  HPI Patient comes in today for followup of her obstructive sleep apnea.  She has been doing very well on her CPAP, and has lost 26 pounds since her last visit.  She has not had any issues with her CPAP pressure.  She believes that she sleeps well with the device, however her sleep is disrupted by her chronic back pain.  She feels that her alertness during the day is excellent.   Review of Systems  Constitutional: Negative for fever and unexpected weight change.  HENT: Negative for ear pain, nosebleeds, congestion, sore throat, rhinorrhea, sneezing, trouble swallowing, dental problem, postnasal drip and sinus pressure.   Eyes: Negative for redness and itching.  Respiratory: Negative for cough, chest tightness, shortness of breath and wheezing.   Cardiovascular: Negative for palpitations and leg swelling.  Gastrointestinal: Negative for nausea and vomiting.  Genitourinary: Negative for dysuria.  Musculoskeletal: Negative for joint swelling.  Skin: Negative for rash.  Neurological: Negative for headaches.  Hematological: Does not bruise/bleed easily.  Psychiatric/Behavioral: Negative for dysphoric mood. The patient is not nervous/anxious.        Objective:   Physical Exam Overweight female in no acute distress Nose without purulent discharge noted No skin breakdown or pressure necrosis from the CPAP mask Lower extremities without edema, cyanosis Alert and oriented, does not appear to be sleepy, moves all 4 extremities.       Assessment & Plan:

## 2013-01-05 ENCOUNTER — Other Ambulatory Visit (HOSPITAL_COMMUNITY): Payer: Self-pay | Admitting: Orthopedic Surgery

## 2013-01-05 DIAGNOSIS — M1990 Unspecified osteoarthritis, unspecified site: Secondary | ICD-10-CM

## 2013-04-18 ENCOUNTER — Ambulatory Visit (INDEPENDENT_AMBULATORY_CARE_PROVIDER_SITE_OTHER): Payer: 59 | Admitting: General Practice

## 2013-04-18 ENCOUNTER — Encounter: Payer: Self-pay | Admitting: General Practice

## 2013-04-18 VITALS — BP 121/69 | HR 66 | Temp 98.3°F | Wt 201.0 lb

## 2013-04-18 DIAGNOSIS — M549 Dorsalgia, unspecified: Secondary | ICD-10-CM

## 2013-04-18 DIAGNOSIS — G8929 Other chronic pain: Secondary | ICD-10-CM

## 2013-04-18 NOTE — Patient Instructions (Addendum)
Back Pain, Adult  Low back pain is very common. About 1 in 5 people have back pain. The cause of low back pain is rarely dangerous. The pain often gets better over time. About half of people with a sudden onset of back pain feel better in just 2 weeks. About 8 in 10 people feel better by 6 weeks.   CAUSES  Some common causes of back pain include:  · Strain of the muscles or ligaments supporting the spine.  · Wear and tear (degeneration) of the spinal discs.  · Arthritis.  · Direct injury to the back.  DIAGNOSIS  Most of the time, the direct cause of low back pain is not known. However, back pain can be treated effectively even when the exact cause of the pain is unknown. Answering your caregiver's questions about your overall health and symptoms is one of the most accurate ways to make sure the cause of your pain is not dangerous. If your caregiver needs more information, he or she may order lab work or imaging tests (X-rays or MRIs). However, even if imaging tests show changes in your back, this usually does not require surgery.  HOME CARE INSTRUCTIONS  For many people, back pain returns. Since low back pain is rarely dangerous, it is often a condition that people can learn to manage on their own.   · Remain active. It is stressful on the back to sit or stand in one place. Do not sit, drive, or stand in one place for more than 30 minutes at a time. Take short walks on level surfaces as soon as pain allows. Try to increase the length of time you walk each day.  · Do not stay in bed. Resting more than 1 or 2 days can delay your recovery.  · Do not avoid exercise or work. Your body is made to move. It is not dangerous to be active, even though your back may hurt. Your back will likely heal faster if you return to being active before your pain is gone.  · Pay attention to your body when you  bend and lift. Many people have less discomfort when lifting if they bend their knees, keep the load close to their bodies, and  avoid twisting. Often, the most comfortable positions are those that put less stress on your recovering back.  · Find a comfortable position to sleep. Use a firm mattress and lie on your side with your knees slightly bent. If you lie on your back, put a pillow under your knees.  · Only take over-the-counter or prescription medicines as directed by your caregiver. Over-the-counter medicines to reduce pain and inflammation are often the most helpful. Your caregiver may prescribe muscle relaxant drugs. These medicines help dull your pain so you can more quickly return to your normal activities and healthy exercise.  · Put ice on the injured area.  · Put ice in a plastic bag.  · Place a towel between your skin and the bag.  · Leave the ice on for 15-20 minutes, 3-4 times a day for the first 2 to 3 days. After that, ice and heat may be alternated to reduce pain and spasms.  · Ask your caregiver about trying back exercises and gentle massage. This may be of some benefit.  · Avoid feeling anxious or stressed. Stress increases muscle tension and can worsen back pain. It is important to recognize when you are anxious or stressed and learn ways to manage it. Exercise is a great option.  SEEK MEDICAL CARE IF:  · You have pain that is not relieved with rest or   medicine.  · You have pain that does not improve in 1 week.  · You have new symptoms.  · You are generally not feeling well.  SEEK IMMEDIATE MEDICAL CARE IF:   · You have pain that radiates from your back into your legs.  · You develop new bowel or bladder control problems.  · You have unusual weakness or numbness in your arms or legs.  · You develop nausea or vomiting.  · You develop abdominal pain.  · You feel faint.  Document Released: 08/18/2005 Document Revised: 02/17/2012 Document Reviewed: 01/06/2011  ExitCare® Patient Information ©2014 [employer].

## 2013-04-18 NOTE — Progress Notes (Signed)
  Subjective:    Patient ID: Brenda Meyer, female    DOB: 14-Oct-1960, 52 y.o.   MRN: [id number]  HPI Patient presents today for follow up of chronic thoracic area back pain. She is currently using TENS unit (2-3 times weekly) and heating pain (2-3 times weekly) pain. She taking vicodin at bedtime and ultram while working. She reports sleep being interrupted 2 to 3 times nightly due to pain.     Review of Systems  Constitutional: Negative for fever and chills.  Respiratory: Negative for cough and chest tightness.   Cardiovascular: Negative for chest pain and palpitations.  Gastrointestinal: Negative for abdominal pain and constipation.  Genitourinary: Negative for dysuria and difficulty urinating.  Musculoskeletal: Positive for back pain.       Chronic back pain       Objective:   Physical Exam  Constitutional: She is oriented to person, place, and time. She appears well-developed and well-nourished.  HENT:  Head: Normocephalic.  Cardiovascular: Normal rate, regular rhythm and normal heart sounds.   Pulmonary/Chest: Effort normal and breath sounds normal.  Musculoskeletal: She exhibits tenderness.  Tenderness and discomfort noted with palpation to thoracic area of back. Patient illustrated back discomfort while sitting and walking  Neurological: She is alert and oriented to person, place, and time.  Skin: Skin is warm and dry.  Psychiatric: She has a normal mood and affect.          Assessment & Plan:  1. Chronic back pain -Continue current medications and treatment prescribed -RTO if symptoms worsen or seek emergency medical treatment -Maintain scheduled appointments -Patient verbalized understanding -Coralie Keens, FNP-C

## 2013-05-11 ENCOUNTER — Other Ambulatory Visit: Payer: Self-pay

## 2013-05-11 ENCOUNTER — Other Ambulatory Visit: Payer: Self-pay | Admitting: General Practice

## 2013-05-11 MED ORDER — HYDROCODONE-ACETAMINOPHEN 5-500 MG PO TABS: 1.0000 | ORAL_TABLET | Freq: Three times a day (TID) | ORAL | Status: DC | PRN

## 2013-05-11 NOTE — Telephone Encounter (Signed)
Last seen 04/18/13  Mae  If approved print and route to nurse

## 2013-05-11 NOTE — Telephone Encounter (Signed)
Please inform that script is ready for pick up.  

## 2013-05-12 NOTE — Telephone Encounter (Signed)
LM, rx for vicodin ready for pick up.

## 2013-06-08 DIAGNOSIS — Z029 Encounter for administrative examinations, unspecified: Secondary | ICD-10-CM

## 2013-06-27 ENCOUNTER — Other Ambulatory Visit: Payer: Self-pay

## 2013-06-27 NOTE — Telephone Encounter (Signed)
Last seen 04/18/13  Brenda Meyer  If approved print and route to nurse

## 2013-06-28 NOTE — Telephone Encounter (Signed)
Please find out who prescribed ultram in past.

## 2013-06-29 ENCOUNTER — Telehealth: Payer: Self-pay | Admitting: General Practice

## 2013-06-29 NOTE — Telephone Encounter (Signed)
Patient was not calling for labs calling on an RX and was taken care of

## 2013-06-29 NOTE — Telephone Encounter (Signed)
Brenda Meyer

## 2013-07-06 ENCOUNTER — Other Ambulatory Visit: Payer: Self-pay

## 2013-07-06 NOTE — Telephone Encounter (Signed)
Last seen 04/18/13  Mae  If approved print and route to nurse

## 2013-09-30 ENCOUNTER — Encounter (INDEPENDENT_AMBULATORY_CARE_PROVIDER_SITE_OTHER): Payer: Self-pay

## 2013-09-30 ENCOUNTER — Encounter: Payer: Self-pay | Admitting: General Practice

## 2013-09-30 ENCOUNTER — Ambulatory Visit (INDEPENDENT_AMBULATORY_CARE_PROVIDER_SITE_OTHER): Payer: 59 | Admitting: General Practice

## 2013-09-30 VITALS — BP 134/75 | HR 71 | Temp 97.1°F | Ht 67.0 in | Wt 215.0 lb

## 2013-09-30 DIAGNOSIS — IMO0002 Reserved for concepts with insufficient information to code with codable children: Secondary | ICD-10-CM

## 2013-09-30 DIAGNOSIS — M62838 Other muscle spasm: Secondary | ICD-10-CM

## 2013-09-30 DIAGNOSIS — F411 Generalized anxiety disorder: Secondary | ICD-10-CM

## 2013-09-30 DIAGNOSIS — Z09 Encounter for follow-up examination after completed treatment for conditions other than malignant neoplasm: Secondary | ICD-10-CM

## 2013-09-30 DIAGNOSIS — M792 Neuralgia and neuritis, unspecified: Secondary | ICD-10-CM

## 2013-09-30 DIAGNOSIS — E119 Type 2 diabetes mellitus without complications: Secondary | ICD-10-CM

## 2013-09-30 DIAGNOSIS — E039 Hypothyroidism, unspecified: Secondary | ICD-10-CM

## 2013-09-30 DIAGNOSIS — E785 Hyperlipidemia, unspecified: Secondary | ICD-10-CM

## 2013-09-30 LAB — POCT GLYCOSYLATED HEMOGLOBIN (HGB A1C): Hemoglobin A1C: 6.6

## 2013-09-30 LAB — POCT CBC
Granulocyte percent: 63 %G (ref 37–80)
HCT, POC: 42 % (ref 37.7–47.9)
Hemoglobin: 13.7 g/dL (ref 12.2–16.2)
Lymph, poc: 3.7 — AB (ref 0.6–3.4)
MCH, POC: 28.6 pg (ref 27–31.2)
MCHC: 32 g/dL (ref 31.8–35.4)
MCV: 89.4 fL (ref 80–97)
MPV: 9.5 fL (ref 0–99.8)
POC Granulocyte: 7 — AB (ref 2–6.9)
POC LYMPH PERCENT: 33.7 %L (ref 10–50)
Platelet Count, POC: 228 10*3/uL (ref 142–424)
RBC: 4.8 M/uL (ref 4.04–5.48)
RDW, POC: 13.6 %
WBC: 11.1 10*3/uL — AB (ref 4.6–10.2)

## 2013-09-30 MED ORDER — ATORVASTATIN CALCIUM 20 MG PO TABS: 20.0000 mg | ORAL_TABLET | Freq: Every day | ORAL | Status: DC

## 2013-09-30 MED ORDER — GABAPENTIN 300 MG PO CAPS: 300.0000 mg | ORAL_CAPSULE | Freq: Every day | ORAL | Status: DC

## 2013-09-30 MED ORDER — METFORMIN HCL 500 MG PO TABS: 500.0000 mg | ORAL_TABLET | Freq: Every day | ORAL | Status: DC

## 2013-09-30 MED ORDER — LEVOTHYROXINE SODIUM 25 MCG PO TABS: 25.0000 ug | ORAL_TABLET | Freq: Every day | ORAL | Status: DC

## 2013-09-30 MED ORDER — DULOXETINE HCL 30 MG PO CPEP: 60.0000 mg | ORAL_CAPSULE | Freq: Every day | ORAL | Status: DC

## 2013-09-30 MED ORDER — METHOCARBAMOL 750 MG PO TABS: 750.0000 mg | ORAL_TABLET | Freq: Every evening | ORAL | Status: DC | PRN

## 2013-10-01 LAB — LIPID PANEL
Chol/HDL Ratio: 2.9 ratio units (ref 0.0–4.4)
Cholesterol, Total: 155 mg/dL (ref 100–199)
HDL: 54 mg/dL (ref >39)
LDL Calculated: 72 mg/dL (ref 0–99)
Triglycerides: 144 mg/dL (ref 0–149)
VLDL Cholesterol Cal: 29 mg/dL (ref 5–40)

## 2013-10-01 LAB — CMP14+EGFR
ALT: 10 IU/L (ref 0–32)
AST: 13 IU/L (ref 0–40)
Albumin/Globulin Ratio: 1.9 (ref 1.1–2.5)
Albumin: 4.4 g/dL (ref 3.5–5.5)
Alkaline Phosphatase: 122 IU/L — ABNORMAL HIGH (ref 39–117)
BUN/Creatinine Ratio: 9 (ref 9–23)
BUN: 6 mg/dL (ref 6–24)
CO2: 25 mmol/L (ref 18–29)
Calcium: 9.7 mg/dL (ref 8.7–10.2)
Chloride: 99 mmol/L (ref 97–108)
Creatinine, Ser: 0.67 mg/dL (ref 0.57–1.00)
GFR calc Af Amer: 115 mL/min/{1.73_m2} (ref >59)
GFR calc non Af Amer: 100 mL/min/{1.73_m2} (ref >59)
Globulin, Total: 2.3 g/dL (ref 1.5–4.5)
Glucose: 78 mg/dL (ref 65–99)
Potassium: 4.5 mmol/L (ref 3.5–5.2)
Sodium: 141 mmol/L (ref 134–144)
Total Bilirubin: 0.3 mg/dL (ref 0.0–1.2)
Total Protein: 6.7 g/dL (ref 6.0–8.5)

## 2013-10-01 LAB — THYROID PANEL WITH TSH
Free Thyroxine Index: 1.6 (ref 1.2–4.9)
T3 Uptake Ratio: 25 % (ref 24–39)
T4, Total: 6.4 ug/dL (ref 4.5–12.0)
TSH: 4.04 u[IU]/mL (ref 0.450–4.500)

## 2013-10-04 ENCOUNTER — Other Ambulatory Visit: Payer: Self-pay

## 2013-10-04 DIAGNOSIS — Z1231 Encounter for screening mammogram for malignant neoplasm of breast: Secondary | ICD-10-CM

## 2013-10-05 NOTE — Progress Notes (Signed)
   Subjective:    Patient ID: Brenda Meyer, female    DOB: [birth year], 53 y.o.   MRN: [id number]  HPI Patient presents for chronic health follow up. Taking medications as directed. Reports trying to eat a healthy diet, unable to exercise due to chronic back pain.    Review of Systems  Constitutional: Negative for fever and chills.  Respiratory: Negative for cough and chest tightness.   Cardiovascular: Negative for chest pain and palpitations.  Gastrointestinal: Negative for abdominal pain and constipation.  Genitourinary: Negative for dysuria and difficulty urinating.  Musculoskeletal: Positive for back pain.       Chronic back pain       Objective:   Physical Exam  Constitutional: She is oriented to person, place, and time. She appears well-developed and well-nourished.  HENT:  Head: Normocephalic.  Cardiovascular: Normal rate, regular rhythm and normal heart sounds.   Pulmonary/Chest: Effort normal and breath sounds normal.  Neurological: She is alert and oriented to person, place, and time.  Skin: Skin is warm and dry.  Psychiatric: She has a normal mood and affect.          Assessment & Plan:  1. Generalized anxiety disorder  - DULoxetine (CYMBALTA) 30 MG capsule; Take 2 capsules (60 mg total) by mouth daily.  Dispense: 180 capsule; Refill: 3  2. HLD (hyperlipidemia)  - atorvastatin (LIPITOR) 20 MG tablet; Take 1 tablet (20 mg total) by mouth daily.  Dispense: 90 tablet; Refill: 3 - Lipid panel  3. Diabetes mellitus, type 2  - metFORMIN (GLUCOPHAGE) 500 MG tablet; Take 1 tablet (500 mg total) by mouth daily.  Dispense: 90 tablet; Refill: 1 - POCT glycosylated hemoglobin (Hb A1C)  4. Hypothyroidism  - levothyroxine (SYNTHROID, LEVOTHROID) 25 MCG tablet; Take 1 tablet (25 mcg total) by mouth daily.  Dispense: 90 tablet; Refill: 3 - Thyroid Panel With TSH  5. Muscle spasm  - methocarbamol (ROBAXIN) 750 MG tablet; Take 1 tablet (750 mg total) by mouth at  bedtime as needed.  Dispense: 90 tablet; Refill: 3  6. Neuropathic pain  - gabapentin (NEURONTIN) 300 MG capsule; Take 1 capsule (300 mg total) by mouth at bedtime.  Dispense: 90 capsule; Refill: 3  7. Follow-up exam, 3-6 months since previous exam  - POCT CBC - CMP14+EGFR -Continue all current medications Labs pending F/u in 3 months Discussed benefits of regular exercise and healthy eating Patient verbalized understanding Erby Pian, FNP-C

## 2013-11-15 ENCOUNTER — Ambulatory Visit
Admission: RE | Admit: 2013-11-15 | Discharge: 2013-11-15 | Disposition: A | Discharge status: Home / Self Care | Payer: 59 | Source: Ambulatory Visit

## 2013-11-15 DIAGNOSIS — Z1231 Encounter for screening mammogram for malignant neoplasm of breast: Secondary | ICD-10-CM

## 2014-01-03 ENCOUNTER — Telehealth: Payer: Self-pay | Admitting: Family Medicine

## 2014-01-03 NOTE — Telephone Encounter (Signed)
APPT MADE

## 2014-01-17 ENCOUNTER — Ambulatory Visit: Payer: 59 | Admitting: Physician Assistant

## 2014-02-01 ENCOUNTER — Ambulatory Visit: Payer: 59 | Admitting: Pulmonary Disease

## 2014-02-27 ENCOUNTER — Encounter: Payer: Self-pay | Admitting: Pulmonary Disease

## 2014-02-27 ENCOUNTER — Ambulatory Visit (INDEPENDENT_AMBULATORY_CARE_PROVIDER_SITE_OTHER): Payer: 59 | Admitting: Pulmonary Disease

## 2014-02-27 VITALS — BP 110/68 | HR 60 | Temp 97.5°F | Ht 68.0 in | Wt 215.6 lb

## 2014-02-27 DIAGNOSIS — G4733 Obstructive sleep apnea (adult) (pediatric): Secondary | ICD-10-CM

## 2014-02-27 NOTE — Patient Instructions (Signed)
Continue on cpap, and keep up with mask changes and supplies. Keep working on [employer] loss Bring your card by here or advanced so we can get a download to check on your pressure. followup with me again in one year.

## 2014-02-27 NOTE — Progress Notes (Signed)
   Subjective:    Patient ID: Brenda Meyer, female    DOB: 08/06/1961, 53 y.o.   MRN: [id number]  HPI The patient comes in today for followup of her obstructive sleep apnea. She is wearing CPAP compliantly, and feels that she is doing well with the device. She is satisfied with her sleep and daytime alertness, although her weight is increased significantly since the last visit.   Review of Systems  Constitutional: Negative for fever and unexpected weight change.  HENT: Negative for congestion, dental problem, ear pain, nosebleeds, postnasal drip, rhinorrhea, sinus pressure, sneezing, sore throat and trouble swallowing.   Eyes: Negative for redness and itching.  Respiratory: Negative for cough, chest tightness, shortness of breath and wheezing.   Cardiovascular: Negative for palpitations and leg swelling.  Gastrointestinal: Negative for nausea and vomiting.  Genitourinary: Negative for dysuria.  Musculoskeletal: Negative for joint swelling.  Skin: Negative for rash.  Neurological: Negative for headaches.  Hematological: Does not bruise/bleed easily.  Psychiatric/Behavioral: Negative for dysphoric mood. The patient is not nervous/anxious.        Objective:   Physical Exam Overweight female in no acute distress Nose without purulence or discharge noted No skin breakdown or pressure necrosis from the CPAP mask Neck without lymphadenopathy or thyromegaly Lower extremities without edema, no cyanosis Alert and oriented, does not appear to be sleepy, moves all 4 extremities.       Assessment & Plan:

## 2014-02-27 NOTE — Assessment & Plan Note (Signed)
Patient is doing well from a sleep apnea standpoint by her history, but I would like to check a download to make sure we are adequately controlling her sleep apnea at her current pressure. I've asked her to keep up with her mask changes and supplies, and to work more aggressively on weight reduction.

## 2014-03-10 ENCOUNTER — Ambulatory Visit (INDEPENDENT_AMBULATORY_CARE_PROVIDER_SITE_OTHER): Payer: 59 | Admitting: Family Medicine

## 2014-03-10 ENCOUNTER — Encounter: Payer: Self-pay | Admitting: Family Medicine

## 2014-03-10 VITALS — BP 136/73 | HR 60 | Temp 97.6°F | Ht 68.0 in | Wt 215.4 lb

## 2014-03-10 DIAGNOSIS — M546 Pain in thoracic spine: Secondary | ICD-10-CM

## 2014-03-10 MED ORDER — CYCLOBENZAPRINE HCL 10 MG PO TABS: 10.0000 mg | ORAL_TABLET | Freq: Three times a day (TID) | ORAL | Status: DC | PRN

## 2014-03-10 MED ORDER — KETOROLAC TROMETHAMINE 30 MG/ML IJ SOLN
30.0000 mg | Freq: Once | INTRAMUSCULAR | Status: AC
  Administered 2014-03-10: 30 mg via INTRAMUSCULAR

## 2014-03-10 NOTE — Progress Notes (Signed)
   Subjective:    Patient ID: Burman Riisatty P Wimberly, female    DOB: [birth year], 53 y.o.   MRN: [id number]  HPI C/O lower back pain and thoracic back pain.  She states she is a chronic "back painer" And has been to PT and it doesn't help.  She has had no injury and has had back pain for A week.   Review of Systems C/o back pain   No chest pain, SOB, HA, dizziness, vision change, N/V, diarrhea, constipation, dysuria, urinary urgency or frequency or rash.  Objective:   Physical Exam   TTP LS and upper  Back FROM LS spine Negative SLR bilateral     Assessment & Plan:  Bilateral thoracic back pain - Plan: ketorolac (TORADOL) 30 MG/ML injection 30 mg, cyclobenzaprine (FLEXERIL) 10 MG tablet, DISCONTINUED: cyclobenzaprine (FLEXERIL) 10 MG tablet  Deatra CanterWilliam J Oxford FNP

## 2014-03-10 NOTE — Progress Notes (Signed)
Flexeril script was called to [facility] per patient request. [facility]

## 2014-03-16 ENCOUNTER — Ambulatory Visit: Payer: 59 | Admitting: Family Medicine

## 2014-03-22 ENCOUNTER — Ambulatory Visit: Payer: 59 | Admitting: Family Medicine

## 2014-04-06 ENCOUNTER — Other Ambulatory Visit: Payer: Self-pay | Admitting: Family Medicine

## 2014-04-10 NOTE — Telephone Encounter (Signed)
Please advise on refill.

## 2014-05-02 ENCOUNTER — Other Ambulatory Visit: Payer: Self-pay | Admitting: *Deleted

## 2014-05-02 MED ORDER — METFORMIN HCL 500 MG PO TABS: 500.0000 mg | ORAL_TABLET | Freq: Every day | ORAL | Status: DC

## 2014-05-02 NOTE — Telephone Encounter (Signed)
Last A1C 09/2013 

## 2014-05-10 ENCOUNTER — Telehealth: Payer: Self-pay | Admitting: Family Medicine

## 2014-05-10 MED ORDER — METFORMIN HCL 500 MG PO TABS: 500.0000 mg | ORAL_TABLET | Freq: Every day | ORAL | Status: DC

## 2014-05-10 NOTE — Telephone Encounter (Signed)
done

## 2014-05-29 ENCOUNTER — Other Ambulatory Visit: Payer: Self-pay | Admitting: Family Medicine

## 2014-06-02 ENCOUNTER — Telehealth: Payer: Self-pay | Admitting: Family Medicine

## 2014-06-14 ENCOUNTER — Ambulatory Visit (INDEPENDENT_AMBULATORY_CARE_PROVIDER_SITE_OTHER): Payer: 59 | Admitting: Family Medicine

## 2014-06-14 ENCOUNTER — Encounter: Payer: Self-pay | Admitting: Family Medicine

## 2014-06-14 VITALS — BP 140/69 | HR 71 | Temp 98.6°F | Ht 68.0 in | Wt 213.0 lb

## 2014-06-14 DIAGNOSIS — E119 Type 2 diabetes mellitus without complications: Secondary | ICD-10-CM

## 2014-06-14 DIAGNOSIS — E038 Other specified hypothyroidism: Secondary | ICD-10-CM

## 2014-06-14 DIAGNOSIS — E785 Hyperlipidemia, unspecified: Secondary | ICD-10-CM

## 2014-06-14 DIAGNOSIS — M5441 Lumbago with sciatica, right side: Secondary | ICD-10-CM

## 2014-06-14 DIAGNOSIS — F411 Generalized anxiety disorder: Secondary | ICD-10-CM

## 2014-06-14 LAB — POCT CBC
Granulocyte percent: 59.3 %G (ref 37–80)
HCT, POC: 36.5 % — AB (ref 37.7–47.9)
Hemoglobin: 12.4 g/dL (ref 12.2–16.2)
Lymph, poc: 3.9 — AB (ref 0.6–3.4)
MCH, POC: 29.7 pg (ref 27–31.2)
MCHC: 33.9 g/dL (ref 31.8–35.4)
MCV: 87.7 fL (ref 80–97)
MPV: 9 fL (ref 0–99.8)
POC Granulocyte: 6.2 (ref 2–6.9)
POC LYMPH PERCENT: 37.5 %L (ref 10–50)
Platelet Count, POC: 199 10*3/uL (ref 142–424)
RBC: 4.2 M/uL (ref 4.04–5.48)
RDW, POC: 14 %
WBC: 10.4 10*3/uL — AB (ref 4.6–10.2)

## 2014-06-14 LAB — POCT GLYCOSYLATED HEMOGLOBIN (HGB A1C): Hemoglobin A1C: 6.6

## 2014-06-14 MED ORDER — LEVOTHYROXINE SODIUM 25 MCG PO TABS: 25.0000 ug | ORAL_TABLET | Freq: Every day | ORAL | Status: DC

## 2014-06-14 MED ORDER — DULOXETINE HCL 30 MG PO CPEP: 60.0000 mg | ORAL_CAPSULE | Freq: Every day | ORAL | Status: DC

## 2014-06-14 MED ORDER — METFORMIN HCL 500 MG PO TABS: 500.0000 mg | ORAL_TABLET | Freq: Every day | ORAL | Status: DC

## 2014-06-14 MED ORDER — ATORVASTATIN CALCIUM 20 MG PO TABS: 20.0000 mg | ORAL_TABLET | Freq: Every day | ORAL | Status: DC

## 2014-06-14 MED ORDER — CYCLOBENZAPRINE HCL 10 MG PO TABS: ORAL_TABLET | ORAL | Status: DC

## 2014-06-14 NOTE — Progress Notes (Signed)
   Subjective:    Patient ID: Brenda Meyer, female    DOB: [birth year], 53 y.o.   MRN: [id number]  HPI Patient is here for follow up.  She has chronic low back pain.  She has DDD of the LS spine and scoliosis.  She is out of work on occasion due to this condition.  She was out of work this year 10 times.  She has been referred to orthopedics and PT.  She is an ICU nurse.  She states she has chronic LBP due to her job.  She needs FMLA paperwork filled out.   Review of Systems    No chest pain, SOB, HA, dizziness, vision change, N/V, diarrhea, constipation, dysuria, urinary urgency or frequency, myalgias, arthralgias or rash.  Objective:   Physical Exam Vital signs noted  Well developed well nourished female.  HEENT - Head atraumatic Normocephalic                Eyes - PERRLA, Conjuctiva - clear Sclera- Clear EOMI                Ears - EAC's Wnl TM's Wnl Gross Hearing WNL                Nose - Nares patent                 Throat - oropharanx wnl Respiratory - Lungs CTA bilateral Cardiac - RRR S1 and S2 without murmur GI - Abdomen soft Nontender and bowel sounds active x 4 Extremities - No edema. Neuro - Grossly intact.       Assessment & Plan:  HLD (hyperlipidemia) - Plan: POCT glycosylated hemoglobin (Hb A1C), CMP14+EGFR, Lipid panel, atorvastatin (LIPITOR) 20 MG tablet, DISCONTINUED: atorvastatin (LIPITOR) 20 MG tablet  Generalized anxiety disorder - Plan: DULoxetine (CYMBALTA) 30 MG capsule, DISCONTINUED: DULoxetine (CYMBALTA) 30 MG capsule  Diabetes mellitus type 2, controlled, without complications - Plan: POCT glycosylated hemoglobin (Hb A1C), POCT CBC, CMP14+EGFR, Lipid panel, metFORMIN (GLUCOPHAGE) 500 MG tablet  Other specified hypothyroidism - Plan: levothyroxine (SYNTHROID, LEVOTHROID) 25 MCG tablet  Low back pain with right-sided sciatica, unspecified back pain laterality - Plan: cyclobenzaprine (FLEXERIL) 10 MG tablet  Lysbeth Penner FNP

## 2014-06-15 LAB — CMP14+EGFR
ALT: 9 IU/L (ref 0–32)
AST: 12 IU/L (ref 0–40)
Albumin/Globulin Ratio: 1.9 (ref 1.1–2.5)
Albumin: 4.1 g/dL (ref 3.5–5.5)
Alkaline Phosphatase: 100 IU/L (ref 39–117)
BUN/Creatinine Ratio: 13 (ref 9–23)
BUN: 8 mg/dL (ref 6–24)
CO2: 24 mmol/L (ref 18–29)
Calcium: 9.3 mg/dL (ref 8.7–10.2)
Chloride: 96 mmol/L — ABNORMAL LOW (ref 97–108)
Creatinine, Ser: 0.64 mg/dL (ref 0.57–1.00)
GFR calc Af Amer: 117 mL/min/{1.73_m2} (ref >59)
GFR calc non Af Amer: 101 mL/min/{1.73_m2} (ref >59)
Globulin, Total: 2.2 g/dL (ref 1.5–4.5)
Glucose: 74 mg/dL (ref 65–99)
Potassium: 4.1 mmol/L (ref 3.5–5.2)
Sodium: 136 mmol/L (ref 134–144)
Total Bilirubin: 0.3 mg/dL (ref 0.0–1.2)
Total Protein: 6.3 g/dL (ref 6.0–8.5)

## 2014-06-15 LAB — LIPID PANEL
Chol/HDL Ratio: 3.1 ratio units (ref 0.0–4.4)
Cholesterol, Total: 140 mg/dL (ref 100–199)
HDL: 45 mg/dL (ref >39)
LDL Calculated: 67 mg/dL (ref 0–99)
Triglycerides: 142 mg/dL (ref 0–149)
VLDL Cholesterol Cal: 28 mg/dL (ref 5–40)

## 2014-07-04 ENCOUNTER — Telehealth: Payer: Self-pay | Admitting: Family Medicine

## 2014-07-04 NOTE — Telephone Encounter (Signed)
error 

## 2014-07-05 ENCOUNTER — Other Ambulatory Visit: Payer: Self-pay | Admitting: Family Medicine

## 2014-07-24 ENCOUNTER — Encounter: Payer: Self-pay | Admitting: Nurse Practitioner

## 2014-07-24 ENCOUNTER — Ambulatory Visit (INDEPENDENT_AMBULATORY_CARE_PROVIDER_SITE_OTHER): Payer: 59 | Admitting: Nurse Practitioner

## 2014-07-24 VITALS — BP 144/73 | HR 84 | Temp 97.3°F | Ht 68.0 in | Wt 212.0 lb

## 2014-07-24 DIAGNOSIS — J209 Acute bronchitis, unspecified: Secondary | ICD-10-CM

## 2014-07-24 MED ORDER — PREDNISONE 20 MG PO TABS: 20.0000 mg | ORAL_TABLET | Freq: Every day | ORAL | Status: DC

## 2014-07-24 MED ORDER — HYDROCODONE-HOMATROPINE 5-1.5 MG/5ML PO SYRP
5.0000 mL | ORAL_SOLUTION | Freq: Three times a day (TID) | ORAL | Status: DC | PRN
Start: 2014-07-24 — End: 2014-12-08

## 2014-07-24 MED ORDER — AZITHROMYCIN 250 MG PO TABS: ORAL_TABLET | ORAL | Status: DC

## 2014-07-24 NOTE — Patient Instructions (Signed)

## 2014-07-24 NOTE — Progress Notes (Signed)
   Subjective:    Patient ID: Burman Riisatty P Lipps, female    DOB: [birth year], 53 y.o.   MRN: [id number]  HPI Patient in today c/o cough and congestion that started Thursday- wheezing    Review of Systems  Constitutional: Negative for fever and chills.  HENT: Positive for congestion, ear pain, rhinorrhea and sinus pressure. Negative for sore throat, trouble swallowing and voice change.   Respiratory: Positive for cough (productive).   Cardiovascular: Negative.   Gastrointestinal: Negative.   Genitourinary: Negative.   Neurological: Negative.   Psychiatric/Behavioral: Negative.   All other systems reviewed and are negative.      Objective:   Physical Exam  Constitutional: She is oriented to person, place, and time. She appears well-developed and well-nourished. No distress.  HENT:  Right Ear: Hearing, tympanic membrane, external ear and ear canal normal.  Left Ear: Hearing, tympanic membrane, external ear and ear canal normal.  Nose: Mucosal edema and rhinorrhea present. Right sinus exhibits maxillary sinus tenderness. Right sinus exhibits no frontal sinus tenderness. Left sinus exhibits maxillary sinus tenderness. Left sinus exhibits no frontal sinus tenderness.  Mouth/Throat: Uvula is midline, oropharynx is clear and moist and mucous membranes are normal.  Cardiovascular: Normal rate, regular rhythm and normal heart sounds.   Pulmonary/Chest: Effort normal. She has wheezes (exp wheezes throughout.).  Neurological: She is alert and oriented to person, place, and time.  Skin: Skin is warm and dry.  Psychiatric: She has a normal mood and affect. Her behavior is normal. Judgment and thought content normal.   BP 144/73 mmHg  Pulse 84  Temp(Src) 97.3 F (36.3 C) (Oral)  Ht 5\' 8"  (1.727 m)  Wt 212 lb (96.163 kg)  BMI 32.24 kg/m2        Assessment & Plan:  1. Acute bronchitis, unspecified organism 1. Take meds as prescribed 2. Use a cool mist humidifier especially during the  winter months and when heat has been humid. 3. Use saline nose sprays frequently 4. Saline irrigations of the nose can be very helpful if done frequently.  * 4X daily for 1 week*  * Use of a nettie pot can be helpful with this. Follow directions with this* 5. Drink plenty of fluids 6. Keep thermostat turn down low 7.For any cough or congestion  Use plain Mucinex- regular strength or max strength is fine   * Children- consult with Pharmacist for dosing 8. For fever or aces or pains- take tylenol or ibuprofen appropriate for age and weight.  * for fevers greater than 101 orally you may alternate ibuprofen and tylenol every  3 hours.    - azithromycin (ZITHROMAX Z-PAK) 250 MG tablet; As directed  Dispense: 6 each; Refill: 0 - predniSONE (DELTASONE) 20 MG tablet; Take 1 tablet (20 mg total) by mouth daily with breakfast.  Dispense: 10 tablet; Refill: 0 - HYDROcodone-homatropine (HYCODAN) 5-1.5 MG/5ML syrup; Take 5 mLs by mouth every 8 (eight) hours as needed for cough.  Dispense: 120 mL; Refill: 0  Mary-Margaret Daphine DeutscherMartin, FNP

## 2014-08-11 ENCOUNTER — Telehealth: Payer: Self-pay | Admitting: Pulmonary Disease

## 2014-08-11 DIAGNOSIS — G4733 Obstructive sleep apnea (adult) (pediatric): Secondary | ICD-10-CM

## 2014-08-11 NOTE — Telephone Encounter (Signed)
Called and spoke to pt. Pt requesting CPAP supplies of mask and tubing. Order placed. Pt aware to keep her yearly f/u. Pt verbalized understanding. Nothing further needed.

## 2014-09-14 ENCOUNTER — Encounter (HOSPITAL_COMMUNITY): Payer: Self-pay | Admitting: Gastroenterology

## 2014-12-08 ENCOUNTER — Encounter: Payer: Self-pay | Admitting: Family

## 2014-12-08 ENCOUNTER — Ambulatory Visit (INDEPENDENT_AMBULATORY_CARE_PROVIDER_SITE_OTHER): Payer: 59 | Admitting: Family

## 2014-12-08 VITALS — BP 130/77 | HR 70 | Temp 97.6°F | Ht 68.0 in | Wt 222.4 lb

## 2014-12-08 DIAGNOSIS — M546 Pain in thoracic spine: Secondary | ICD-10-CM

## 2014-12-08 DIAGNOSIS — Z23 Encounter for immunization: Secondary | ICD-10-CM | POA: Diagnosis not present

## 2014-12-08 DIAGNOSIS — Z1321 Encounter for screening for nutritional disorder: Secondary | ICD-10-CM | POA: Diagnosis not present

## 2014-12-08 DIAGNOSIS — E785 Hyperlipidemia, unspecified: Secondary | ICD-10-CM

## 2014-12-08 DIAGNOSIS — F411 Generalized anxiety disorder: Secondary | ICD-10-CM

## 2014-12-08 DIAGNOSIS — E119 Type 2 diabetes mellitus without complications: Secondary | ICD-10-CM | POA: Diagnosis not present

## 2014-12-08 DIAGNOSIS — E039 Hypothyroidism, unspecified: Secondary | ICD-10-CM | POA: Diagnosis not present

## 2014-12-08 DIAGNOSIS — E038 Other specified hypothyroidism: Secondary | ICD-10-CM | POA: Diagnosis not present

## 2014-12-08 DIAGNOSIS — M549 Dorsalgia, unspecified: Secondary | ICD-10-CM | POA: Insufficient documentation

## 2014-12-08 DIAGNOSIS — M5441 Lumbago with sciatica, right side: Secondary | ICD-10-CM

## 2014-12-08 DIAGNOSIS — R2232 Localized swelling, mass and lump, left upper limb: Secondary | ICD-10-CM

## 2014-12-08 DIAGNOSIS — M2560 Stiffness of unspecified joint, not elsewhere classified: Secondary | ICD-10-CM

## 2014-12-08 LAB — POCT UA - MICROALBUMIN: Microalbumin Ur, POC: NEGATIVE mg/L

## 2014-12-08 LAB — POCT GLYCOSYLATED HEMOGLOBIN (HGB A1C): Hemoglobin A1C: 7.1

## 2014-12-08 MED ORDER — METFORMIN HCL 500 MG PO TABS: 500.0000 mg | ORAL_TABLET | Freq: Every day | ORAL | Status: DC

## 2014-12-08 MED ORDER — LEVOTHYROXINE SODIUM 25 MCG PO TABS
25.0000 ug | ORAL_TABLET | Freq: Every day | ORAL | Status: DC
Start: 2014-12-08 — End: 2015-05-21

## 2014-12-08 MED ORDER — ATORVASTATIN CALCIUM 20 MG PO TABS: 20.0000 mg | ORAL_TABLET | Freq: Every day | ORAL | Status: DC

## 2014-12-08 MED ORDER — CYCLOBENZAPRINE HCL 10 MG PO TABS: ORAL_TABLET | ORAL | Status: DC

## 2014-12-08 MED ORDER — DULOXETINE HCL 30 MG PO CPEP: 60.0000 mg | ORAL_CAPSULE | Freq: Every day | ORAL | Status: DC

## 2014-12-08 NOTE — Addendum Note (Signed)
Addended by: Almeta MonasSTONE, JANIE M on: 12/08/2014 10:33 AM   Modules accepted: Orders

## 2014-12-08 NOTE — Patient Instructions (Signed)
Chronic Back Pain  When back pain lasts longer than 3 months, it is called chronic back pain.People with chronic back pain often go through certain periods that are more intense (flare-ups).  CAUSES Chronic back pain can be caused by wear and tear (degeneration) on different structures in your back. These structures include:  The bones of your spine (vertebrae) and the joints surrounding your spinal cord and nerve roots (facets).  The strong, fibrous tissues that connect your vertebrae (ligaments). Degeneration of these structures may result in pressure on your nerves. This can lead to constant pain. HOME CARE INSTRUCTIONS  Avoid bending, heavy lifting, prolonged sitting, and activities which make the problem worse.  Take brief periods of rest throughout the day to reduce your pain. Lying down or standing usually is better than sitting while you are resting.  Take over-the-counter or prescription medicines only as directed by your caregiver. SEEK IMMEDIATE MEDICAL CARE IF:   You have weakness or numbness in one of your legs or feet.  You have trouble controlling your bladder or bowels.  You have nausea, vomiting, abdominal pain, shortness of breath, or fainting. Document Released: 09/25/2004 Document Revised: 11/10/2011 Document Reviewed: 08/02/2011 [facility] Patient Information 2015 [place], [place]. This information is not intended to replace advice given to you by your health care provider. Make sure you discuss any questions you have with your health care provider. Health Maintenance Adopting a healthy lifestyle and getting preventive care can go a long way to promote health and wellness. Talk with your health care provider about what schedule of regular examinations is right for you. This is a good chance for you to check in with your provider about disease prevention and staying healthy. In between checkups, there are plenty of things you can do on your own. Experts have done a lot of  research about which lifestyle changes and preventive measures are most likely to keep you healthy. Ask your health care provider for more information. WEIGHT AND DIET  Eat a healthy diet  Be sure to include plenty of vegetables, fruits, low-fat dairy products, and lean protein.  Do not eat a lot of foods high in solid fats, added sugars, or salt.  Get regular exercise. This is one of the most important things you can do for your health.  Most adults should exercise for at least 150 minutes each week. The exercise should increase your heart rate and make you sweat (moderate-intensity exercise).  Most adults should also do strengthening exercises at least twice a week. This is in addition to the moderate-intensity exercise.  Maintain a healthy weight  Body mass index (BMI) is a measurement that can be used to identify possible weight problems. It estimates body fat based on height and weight. Your health care provider can help determine your BMI and help you achieve or maintain a healthy weight.  For females 45 years of age and older:   A BMI below 18.5 is considered underweight.  A BMI of 18.5 to 24.9 is normal.  A BMI of 25 to 29.9 is considered overweight.  A BMI of 30 and above is considered obese.  Watch levels of cholesterol and blood lipids  You should start having your blood tested for lipids and cholesterol at 55 years of age, then have this test every 5 years.  You may need to have your cholesterol levels checked more often if:  Your lipid or cholesterol levels are high.  You are older than 55 years of age.  You  are at high risk for heart disease.  CANCER SCREENING   Lung Cancer  Lung cancer screening is recommended for adults 13-65 years old who are at high risk for lung cancer because of a history of smoking.  A yearly low-dose CT scan of the lungs is recommended for people who:  Currently smoke.  Have quit within the past 15 years.  Have at least a  30-pack-year history of smoking. A pack year is smoking an average of one pack of cigarettes a day for 1 year.  Yearly screening should continue until it has been 15 years since you quit.  Yearly screening should stop if you develop a health problem that would prevent you from having lung cancer treatment.  Breast Cancer  Practice breast self-awareness. This means understanding how your breasts normally appear and feel.  It also means doing regular breast self-exams. Let your health care provider know about any changes, no matter how small.  If you are in your 20s or 30s, you should have a clinical breast exam (CBE) by a health care provider every 1-3 years as part of a regular health exam.  If you are 11 or older, have a CBE every year. Also consider having a breast X-ray (mammogram) every year.  If you have a family history of breast cancer, talk to your health care provider about genetic screening.  If you are at high risk for breast cancer, talk to your health care provider about having an MRI and a mammogram every year.  Breast cancer gene (BRCA) assessment is recommended for women who have family members with BRCA-related cancers. BRCA-related cancers include:  Breast.  Ovarian.  Tubal.  Peritoneal cancers.  Results of the assessment will determine the need for genetic counseling and BRCA1 and BRCA2 testing. Cervical Cancer Routine pelvic examinations to screen for cervical cancer are no longer recommended for nonpregnant women who are considered low risk for cancer of the pelvic organs (ovaries, uterus, and vagina) and who do not have symptoms. A pelvic examination may be necessary if you have symptoms including those associated with pelvic infections. Ask your health care provider if a screening pelvic exam is right for you.   The Pap test is the screening test for cervical cancer for women who are considered at risk.  If you had a hysterectomy for a problem that was not  cancer or a condition that could lead to cancer, then you no longer need Pap tests.  If you are older than 65 years, and you have had normal Pap tests for the past 10 years, you no longer need to have Pap tests.  If you have had past treatment for cervical cancer or a condition that could lead to cancer, you need Pap tests and screening for cancer for at least 20 years after your treatment.  If you no longer get a Pap test, assess your risk factors if they change (such as having a new sexual partner). This can affect whether you should start being screened again.  Some women have medical problems that increase their chance of getting cervical cancer. If this is the case for you, your health care provider may recommend more frequent screening and Pap tests.  The human papillomavirus (HPV) test is another test that may be used for cervical cancer screening. The HPV test looks for the virus that can cause cell changes in the cervix. The cells collected during the Pap test can be tested for HPV.  The HPV test can be  used to screen women 54 years of age and older. Getting tested for HPV can extend the interval between normal Pap tests from three to five years.  An HPV test also should be used to screen women of any age who have unclear Pap test results.  After 55 years of age, women should have HPV testing as often as Pap tests.  Colorectal Cancer  This type of cancer can be detected and often prevented.  Routine colorectal cancer screening usually begins at 55 years of age and continues through 55 years of age.  Your health care provider may recommend screening at an earlier age if you have risk factors for colon cancer.  Your health care provider may also recommend using home test kits to check for hidden blood in the stool.  A small camera at the end of a tube can be used to examine your colon directly (sigmoidoscopy or colonoscopy). This is done to check for the earliest forms of  colorectal cancer.  Routine screening usually begins at age 89.  Direct examination of the colon should be repeated every 5-10 years through 55 years of age. However, you may need to be screened more often if early forms of precancerous polyps or small growths are found. Skin Cancer  Check your skin from head to toe regularly.  Tell your health care provider about any new moles or changes in moles, especially if there is a change in a mole's shape or color.  Also tell your health care provider if you have a mole that is larger than the size of a pencil eraser.  Always use sunscreen. Apply sunscreen liberally and repeatedly throughout the day.  Protect yourself by wearing long sleeves, pants, a wide-brimmed hat, and sunglasses whenever you are outside. HEART DISEASE, DIABETES, AND HIGH BLOOD PRESSURE   Have your blood pressure checked at least every 1-2 years. High blood pressure causes heart disease and increases the risk of stroke.  If you are between 5 years and 33 years old, ask your health care provider if you should take aspirin to prevent strokes.  Have regular diabetes screenings. This involves taking a blood sample to check your fasting blood sugar level.  If you are at a normal weight and have a low risk for diabetes, have this test once every three years after 55 years of age.  If you are overweight and have a high risk for diabetes, consider being tested at a younger age or more often. PREVENTING INFECTION  Hepatitis B  If you have a higher risk for hepatitis B, you should be screened for this virus. You are considered at high risk for hepatitis B if:  You were born in a country where hepatitis B is common. Ask your health care provider which countries are considered high risk.  Your parents were born in a high-risk country, and you have not been immunized against hepatitis B (hepatitis B vaccine).  You have HIV or AIDS.  You use needles to inject street  drugs.  You live with someone who has hepatitis B.  You have had sex with someone who has hepatitis B.  You get hemodialysis treatment.  You take certain medicines for conditions, including cancer, organ transplantation, and autoimmune conditions. Hepatitis C  Blood testing is recommended for:  Everyone born from [birth year] through 1966.  Anyone with known risk factors for hepatitis C. Sexually transmitted infections (STIs)  You should be screened for sexually transmitted infections (STIs) including gonorrhea and chlamydia if:  You  are sexually active and are younger than 55 years of age.  You are older than 55 years of age and your health care provider tells you that you are at risk for this type of infection.  Your sexual activity has changed since you were last screened and you are at an increased risk for chlamydia or gonorrhea. Ask your health care provider if you are at risk.  If you do not have HIV, but are at risk, it may be recommended that you take a prescription medicine daily to prevent HIV infection. This is called pre-exposure prophylaxis (PrEP). You are considered at risk if:  You are sexually active and do not regularly use condoms or know the HIV status of your partner(s).  You take drugs by injection.  You are sexually active with a partner who has HIV. Talk with your health care provider about whether you are at high risk of being infected with HIV. If you choose to begin PrEP, you should first be tested for HIV. You should then be tested every 3 months for as long as you are taking PrEP.  PREGNANCY   If you are premenopausal and you may become pregnant, ask your health care provider about preconception counseling.  If you may become pregnant, take 400 to 800 micrograms (mcg) of folic acid every day.  If you want to prevent pregnancy, talk to your health care provider about birth control (contraception). OSTEOPOROSIS AND MENOPAUSE   Osteoporosis is a disease in  which the bones lose minerals and strength with aging. This can result in serious bone fractures. Your risk for osteoporosis can be identified using a bone density scan.  If you are 98 years of age or older, or if you are at risk for osteoporosis and fractures, ask your health care provider if you should be screened.  Ask your health care provider whether you should take a calcium or vitamin D supplement to lower your risk for osteoporosis.  Menopause may have certain physical symptoms and risks.  Hormone replacement therapy may reduce some of these symptoms and risks. Talk to your health care provider about whether hormone replacement therapy is right for you.  HOME CARE INSTRUCTIONS   Schedule regular health, dental, and eye exams.  Stay current with your immunizations.   Do not use any tobacco products including cigarettes, chewing tobacco, or electronic cigarettes.  If you are pregnant, do not drink alcohol.  If you are breastfeeding, limit how much and how often you drink alcohol.  Limit alcohol intake to no more than 1 drink per day for nonpregnant women. One drink equals 12 ounces of beer, 5 ounces of wine, or 1 ounces of hard liquor.  Do not use street drugs.  Do not share needles.  Ask your health care provider for help if you need support or information about quitting drugs.  Tell your health care provider if you often feel depressed.  Tell your health care provider if you have ever been abused or do not feel safe at home. Document Released: 03/03/2011 Document Revised: 01/02/2014 Document Reviewed: 07/20/2013 [facility] Patient Information 2015 [place], [place]. This information is not intended to replace advice given to you by your health care provider. Make sure you discuss any questions you have with your health care provider.

## 2014-12-08 NOTE — Progress Notes (Signed)
Subjective:    Patient ID: Brenda Meyer, female    DOB: [birth year], 54 y.o.   MRN: [id number]  Back Pain This is a chronic (2004) problem. The current episode started more than 1 year ago. The problem occurs intermittently. The problem has been waxing and waning since onset. The pain is present in the thoracic spine. The quality of the pain is described as burning. The pain does not radiate. The pain is at a severity of 10/10. The pain is moderate. Pertinent negatives include no bladder incontinence, bowel incontinence, dysuria, fever, headaches, leg pain, numbness, tingling or weakness. She has tried muscle relaxant, NSAIDs and heat (TEDs unit) for the symptoms. The treatment provided moderate relief.  Diabetes She presents for her follow-up diabetic visit. She has type 2 diabetes mellitus. There are no hypoglycemic associated symptoms. Pertinent negatives for hypoglycemia include no confusion or headaches. There are no diabetic associated symptoms. Pertinent negatives for diabetes include no blurred vision, no foot paresthesias, no foot ulcerations, no visual change and no weakness. There are no hypoglycemic complications. Pertinent negatives for hypoglycemia complications include no blackouts. Symptoms are stable. Pertinent negatives for diabetic complications include no CVA, heart disease, nephropathy or peripheral neuropathy. Risk factors for coronary artery disease include diabetes mellitus, dyslipidemia, hypertension, obesity and family history. Current diabetic treatment includes oral agent (monotherapy). She is following a generally unhealthy diet. Her breakfast blood glucose range is generally 130-140 mg/dl. An ACE inhibitor/angiotensin II receptor blocker is not being taken. Eye exam is current.  Hyperlipidemia This is a chronic problem. The current episode started more than 1 year ago. The problem is controlled. Recent lipid tests were reviewed and are normal. Exacerbating diseases include  diabetes and obesity. Factors aggravating her hyperlipidemia include fatty foods. Pertinent negatives include no leg pain or shortness of breath. Current antihyperlipidemic treatment includes statins. The current treatment provides significant improvement of lipids. There are no compliance problems.  Risk factors for coronary artery disease include diabetes mellitus, dyslipidemia, family history, obesity and a sedentary lifestyle.  Thyroid Problem Presents for follow-up visit. Patient reports no constipation, depressed mood, diarrhea, dry skin, hair loss, hoarse voice, palpitations or visual change. The symptoms have been stable. Past treatments include levothyroxine. The treatment provided significant relief. Her past medical history is significant for diabetes and hyperlipidemia.    Pt a;lso states she is having "nodules" come up on her left thumb and left toe. Pt states she has pain and then a "bump" will pop up. Pt states her hands are stiff and she has intermittent pain of 2 out 10.   Review of Systems  Constitutional: Negative.  Negative for fever.  HENT: Negative.  Negative for hoarse voice.   Eyes: Negative.  Negative for blurred vision.  Respiratory: Negative.  Negative for shortness of breath.   Cardiovascular: Negative.  Negative for palpitations.  Gastrointestinal: Negative.  Negative for diarrhea, constipation and bowel incontinence.  Endocrine: Negative.   Genitourinary: Negative.  Negative for bladder incontinence and dysuria.  Musculoskeletal: Positive for back pain.  Neurological: Negative.  Negative for tingling, weakness, numbness and headaches.  Hematological: Negative.   Psychiatric/Behavioral: Negative.  Negative for confusion.  All other systems reviewed and are negative.      Objective:   Physical Exam  Constitutional: She is oriented to person, place, and time. She appears well-developed and well-nourished. No distress.  HENT:  Head: Normocephalic and atraumatic.    Right Ear: External ear normal.  Left Ear: External ear normal.  Nose: Nose  normal.  Mouth/Throat: Oropharynx is clear and moist.  Eyes: Pupils are equal, round, and reactive to light.  Neck: Normal range of motion. Neck supple. No thyromegaly present.  Cardiovascular: Normal rate, regular rhythm, normal heart sounds and intact distal pulses.   No murmur heard. Pulmonary/Chest: Effort normal and breath sounds normal. No respiratory distress. She has no wheezes.  Abdominal: Soft. Bowel sounds are normal. She exhibits no distension. There is no tenderness.  Musculoskeletal: Normal range of motion. She exhibits no edema or tenderness.  Neurological: She is alert and oriented to person, place, and time. She has normal reflexes. No cranial nerve deficit.  Skin: Skin is warm and dry.  Psychiatric: She has a normal mood and affect. Her behavior is normal. Judgment and thought content normal.  Vitals reviewed.   BP 130/77 mmHg  Pulse 70  Temp(Src) 97.6 F (36.4 C) (Oral)  Ht 5' 8"  (1.727 m)  Wt 222 lb 6.4 oz (100.88 kg)  BMI 33.82 kg/m2       Assessment & Plan:  1. Midline thoracic back pain - CMP14+EGFR - cyclobenzaprine (FLEXERIL) 10 MG tablet; TAKE 1 TABLET 3 TIMES A DAY AS NEEDED FOR MUSCLE SPASM  Dispense: 180 tablet; Refill: 3 - DULoxetine (CYMBALTA) 30 MG capsule; Take 2 capsules (60 mg total) by mouth daily.  Dispense: 180 capsule; Refill: 3  2. Low back pain with right-sided sciatica, unspecified back pain laterality  3. Diabetes mellitus type 2, controlled, without complications - YYT03+TWSF - POCT glycosylated hemoglobin (Hb A1C) - POCT UA - Microalbumin - metFORMIN (GLUCOPHAGE) 500 MG tablet; Take 1 tablet (500 mg total) by mouth daily.  Dispense: 90 tablet; Refill: 3  4. Other specified hypothyroidism - CMP14+EGFR - Thyroid Panel With TSH - levothyroxine (SYNTHROID, LEVOTHROID) 25 MCG tablet; Take 1 tablet (25 mcg total) by mouth daily.  Dispense: 90 tablet;  Refill: 3  5. Generalized anxiety disorder - CMP14+EGFR  6. HLD (hyperlipidemia) - CMP14+EGFR - Lipid panel - atorvastatin (LIPITOR) 20 MG tablet; Take 1 tablet (20 mg total) by mouth daily.  Dispense: 90 tablet; Refill: 3  7. Multiple stiff joints - Arthritis Panel - CMP14+EGFR  8. Nodule of finger of left hand - Arthritis Panel - CMP14+EGFR  9. Encounter for vitamin deficiency screening - Vit D  25 hydroxy (rtn osteoporosis monitoring)    Continue all meds Labs pending Health Maintenance reviewed Diet and exercise encouraged RTO 6 months  Evelina Dun, FNP

## 2014-12-09 LAB — LIPID PANEL
Chol/HDL Ratio: 2.9 ratio units (ref 0.0–4.4)
Cholesterol, Total: 162 mg/dL (ref 100–199)
HDL: 55 mg/dL (ref >39)
LDL Calculated: 89 mg/dL (ref 0–99)
Triglycerides: 89 mg/dL (ref 0–149)
VLDL Cholesterol Cal: 18 mg/dL (ref 5–40)

## 2014-12-09 LAB — ARTHRITIS PANEL
Basophils Absolute: 0 10*3/uL (ref 0.0–0.2)
Basos: 0 %
Eos: 3 %
Eosinophils Absolute: 0.3 10*3/uL (ref 0.0–0.4)
HCT: 39.3 % (ref 34.0–46.6)
Hemoglobin: 13.2 g/dL (ref 11.1–15.9)
Immature Grans (Abs): 0 10*3/uL (ref 0.0–0.1)
Immature Granulocytes: 0 %
Lymphocytes Absolute: 2.6 10*3/uL (ref 0.7–3.1)
Lymphs: 27 %
MCH: 29.9 pg (ref 26.6–33.0)
MCHC: 33.6 g/dL (ref 31.5–35.7)
MCV: 89 fL (ref 79–97)
Monocytes Absolute: 0.5 10*3/uL (ref 0.1–0.9)
Monocytes: 6 %
Neutrophils Absolute: 6.1 10*3/uL (ref 1.4–7.0)
Neutrophils Relative %: 64 %
Platelets: 262 10*3/uL (ref 150–379)
RBC: 4.41 x10E6/uL (ref 3.77–5.28)
RDW: 13.7 % (ref 12.3–15.4)
Rhuematoid fact SerPl-aCnc: 8.3 IU/mL (ref 0.0–13.9)
Sed Rate: 16 mm/hr (ref 0–40)
Uric Acid: 5.2 mg/dL (ref 2.5–7.1)
WBC: 9.6 10*3/uL (ref 3.4–10.8)

## 2014-12-09 LAB — CMP14+EGFR
ALT: 17 IU/L (ref 0–32)
AST: 17 IU/L (ref 0–40)
Albumin/Globulin Ratio: 1.8 (ref 1.1–2.5)
Albumin: 4.3 g/dL (ref 3.5–5.5)
Alkaline Phosphatase: 118 IU/L — ABNORMAL HIGH (ref 39–117)
BUN/Creatinine Ratio: 9 (ref 9–23)
BUN: 5 mg/dL — ABNORMAL LOW (ref 6–24)
Bilirubin Total: 0.3 mg/dL (ref 0.0–1.2)
CO2: 24 mmol/L (ref 18–29)
Calcium: 9.5 mg/dL (ref 8.7–10.2)
Chloride: 96 mmol/L — ABNORMAL LOW (ref 97–108)
Creatinine, Ser: 0.53 mg/dL — ABNORMAL LOW (ref 0.57–1.00)
GFR calc Af Amer: 124 mL/min/{1.73_m2} (ref >59)
GFR calc non Af Amer: 107 mL/min/{1.73_m2} (ref >59)
Globulin, Total: 2.4 g/dL (ref 1.5–4.5)
Glucose: 130 mg/dL — ABNORMAL HIGH (ref 65–99)
Potassium: 4.2 mmol/L (ref 3.5–5.2)
Sodium: 137 mmol/L (ref 134–144)
Total Protein: 6.7 g/dL (ref 6.0–8.5)

## 2014-12-09 LAB — VITAMIN D 25 HYDROXY (VIT D DEFICIENCY, FRACTURES): Vit D, 25-Hydroxy: 21.4 ng/mL — ABNORMAL LOW (ref 30.0–100.0)

## 2014-12-09 LAB — THYROID PANEL WITH TSH
Free Thyroxine Index: 1.7 (ref 1.2–4.9)
T3 Uptake Ratio: 25 % (ref 24–39)
T4, Total: 6.8 ug/dL (ref 4.5–12.0)
TSH: 3.5 u[IU]/mL (ref 0.450–4.500)

## 2014-12-11 ENCOUNTER — Other Ambulatory Visit: Payer: Self-pay | Admitting: Family

## 2014-12-11 DIAGNOSIS — E559 Vitamin D deficiency, unspecified: Secondary | ICD-10-CM | POA: Insufficient documentation

## 2014-12-11 MED ORDER — VITAMIN D (ERGOCALCIFEROL) 1.25 MG (50000 UNIT) PO CAPS: 50000.0000 [IU] | ORAL_CAPSULE | ORAL | Status: DC

## 2015-05-18 ENCOUNTER — Telehealth: Payer: Self-pay | Admitting: Nurse Practitioner

## 2015-05-21 ENCOUNTER — Encounter: Payer: Self-pay | Admitting: Family

## 2015-05-21 ENCOUNTER — Ambulatory Visit (INDEPENDENT_AMBULATORY_CARE_PROVIDER_SITE_OTHER): Payer: 59 | Admitting: Family

## 2015-05-21 VITALS — BP 147/73 | HR 68 | Temp 97.3°F | Ht 68.0 in | Wt 218.8 lb

## 2015-05-21 DIAGNOSIS — E119 Type 2 diabetes mellitus without complications: Secondary | ICD-10-CM

## 2015-05-21 DIAGNOSIS — M546 Pain in thoracic spine: Secondary | ICD-10-CM

## 2015-05-21 DIAGNOSIS — Z1159 Encounter for screening for other viral diseases: Secondary | ICD-10-CM

## 2015-05-21 DIAGNOSIS — E039 Hypothyroidism, unspecified: Secondary | ICD-10-CM

## 2015-05-21 DIAGNOSIS — E785 Hyperlipidemia, unspecified: Secondary | ICD-10-CM | POA: Diagnosis not present

## 2015-05-21 DIAGNOSIS — G4733 Obstructive sleep apnea (adult) (pediatric): Secondary | ICD-10-CM

## 2015-05-21 DIAGNOSIS — E559 Vitamin D deficiency, unspecified: Secondary | ICD-10-CM

## 2015-05-21 DIAGNOSIS — I10 Essential (primary) hypertension: Secondary | ICD-10-CM

## 2015-05-21 LAB — POCT GLYCOSYLATED HEMOGLOBIN (HGB A1C): Hemoglobin A1C: 7.2

## 2015-05-21 LAB — POCT UA - MICROALBUMIN: Microalbumin Ur, POC: NEGATIVE mg/L

## 2015-05-21 MED ORDER — CYCLOBENZAPRINE HCL 10 MG PO TABS: ORAL_TABLET | ORAL | Status: DC

## 2015-05-21 MED ORDER — LEVOTHYROXINE SODIUM 25 MCG PO TABS: 25.0000 ug | ORAL_TABLET | Freq: Every day | ORAL | Status: DC

## 2015-05-21 MED ORDER — ATORVASTATIN CALCIUM 20 MG PO TABS: 20.0000 mg | ORAL_TABLET | Freq: Every day | ORAL | Status: DC

## 2015-05-21 MED ORDER — ASPIRIN EC 81 MG PO TBEC: 81.0000 mg | DELAYED_RELEASE_TABLET | Freq: Every day | ORAL | Status: AC

## 2015-05-21 MED ORDER — DULOXETINE HCL 30 MG PO CPEP: 60.0000 mg | ORAL_CAPSULE | Freq: Every day | ORAL | Status: DC

## 2015-05-21 MED ORDER — LISINOPRIL 20 MG PO TABS: 20.0000 mg | ORAL_TABLET | Freq: Every day | ORAL | Status: DC

## 2015-05-21 MED ORDER — METFORMIN HCL 500 MG PO TABS: 500.0000 mg | ORAL_TABLET | Freq: Every day | ORAL | Status: DC

## 2015-05-21 NOTE — Progress Notes (Signed)
Subjective:    Patient ID: Brenda Meyer, female    DOB: 25-Sep-1960, 54 y.o.   MRN: [id number]  Pt presents to the office today for chronic follow up. Pt states she also wants to get her FMLA paperwork signed. PT states she has had FMLA for the past 5-6 years. Pt reports missing 4 days of work this year.  Back Pain This is a chronic (2004) problem. The current episode started more than 1 year ago. The problem occurs intermittently. The problem has been waxing and waning since onset. The pain is present in the thoracic spine. The quality of the pain is described as burning. The pain does not radiate. The pain is at a severity of 10/10. The pain is moderate. Pertinent negatives include no bladder incontinence, bowel incontinence, dysuria, fever, headaches, leg pain, numbness, tingling or weakness. She has tried muscle relaxant, NSAIDs and heat (TEDs unit) for the symptoms. The treatment provided moderate relief.  Diabetes She presents for her follow-up diabetic visit. She has type 2 diabetes mellitus. Her disease course has been stable. There are no hypoglycemic associated symptoms. Pertinent negatives for hypoglycemia include no confusion or headaches. There are no diabetic associated symptoms. Pertinent negatives for diabetes include no blurred vision, no foot paresthesias, no foot ulcerations, no visual change and no weakness. There are no hypoglycemic complications. Pertinent negatives for hypoglycemia complications include no blackouts. Symptoms are stable. There are no diabetic complications. Pertinent negatives for diabetic complications include no CVA, heart disease, nephropathy or peripheral neuropathy. Risk factors for coronary artery disease include diabetes mellitus, dyslipidemia, hypertension, obesity and family history. Current diabetic treatment includes oral agent (monotherapy). She is following a generally unhealthy diet. Her breakfast blood glucose range is generally 130-140 mg/dl. An ACE  inhibitor/angiotensin II receptor blocker is not being taken. Eye exam is current.  Hyperlipidemia This is a chronic problem. The current episode started more than 1 year ago. The problem is controlled. Recent lipid tests were reviewed and are normal. Exacerbating diseases include diabetes and obesity. Factors aggravating her hyperlipidemia include fatty foods. Pertinent negatives include no leg pain or shortness of breath. Current antihyperlipidemic treatment includes statins. The current treatment provides significant improvement of lipids. There are no compliance problems.  Risk factors for coronary artery disease include diabetes mellitus, dyslipidemia, family history, obesity and a sedentary lifestyle.  Thyroid Problem Presents for follow-up visit. Patient reports no constipation, depressed mood, diarrhea, dry skin, hair loss, hoarse voice, palpitations or visual change. The symptoms have been stable. Past treatments include levothyroxine. The treatment provided significant relief. Her past medical history is significant for diabetes and hyperlipidemia. There is no history of heart failure.  Hypertension This is a new problem. The current episode started today. The problem is unchanged. The problem is uncontrolled. Pertinent negatives include no blurred vision, headaches, palpitations, peripheral edema or shortness of breath. Risk factors for coronary artery disease include diabetes mellitus, dyslipidemia, obesity, post-menopausal state, sedentary lifestyle and family history. Past treatments include nothing. The current treatment provides no improvement. Hypertensive end-organ damage includes a thyroid problem. There is no history of kidney disease, CAD/MI, CVA or heart failure.      Review of Systems  Constitutional: Negative.  Negative for fever.  HENT: Negative.  Negative for hoarse voice.   Eyes: Negative.  Negative for blurred vision.  Respiratory: Negative.  Negative for shortness of  breath.   Cardiovascular: Negative.  Negative for palpitations.  Gastrointestinal: Negative.  Negative for diarrhea, constipation and bowel incontinence.  Endocrine:  Negative.   Genitourinary: Negative.  Negative for bladder incontinence and dysuria.  Musculoskeletal: Positive for back pain.  Neurological: Negative.  Negative for tingling, weakness, numbness and headaches.  Hematological: Negative.   Psychiatric/Behavioral: Negative.  Negative for confusion.  All other systems reviewed and are negative.      Objective:   Physical Exam  Constitutional: She is oriented to person, place, and time. She appears well-developed and well-nourished. No distress.  HENT:  Head: Normocephalic and atraumatic.  Right Ear: External ear normal.  Left Ear: External ear normal.  Nose: Nose normal.  Mouth/Throat: Oropharynx is clear and moist.  Eyes: Pupils are equal, round, and reactive to light.  Neck: Normal range of motion. Neck supple. No thyromegaly present.  Cardiovascular: Normal rate, regular rhythm, normal heart sounds and intact distal pulses.   No murmur heard. Pulmonary/Chest: Effort normal and breath sounds normal. No respiratory distress. She has no wheezes.  Abdominal: Soft. Bowel sounds are normal. She exhibits no distension. There is no tenderness.  Musculoskeletal: Normal range of motion. She exhibits no edema or tenderness.  Neurological: She is alert and oriented to person, place, and time. She has normal reflexes. No cranial nerve deficit.  Skin: Skin is warm and dry.  Psychiatric: She has a normal mood and affect. Her behavior is normal. Judgment and thought content normal.  Vitals reviewed.   BP 147/73 mmHg  Pulse 68  Temp(Src) 97.3 F (36.3 C) (Oral)  Ht 5' 8"  (1.727 m)  Wt 218 lb 12.8 oz (99.247 kg)  BMI 33.28 kg/m2       Assessment & Plan:  1. OSA (obstructive sleep apnea) - CMP14+EGFR  2. Hypothyroidism, unspecified hypothyroidism type - CMP14+EGFR -  Thyroid Panel With TSH - levothyroxine (SYNTHROID, LEVOTHROID) 25 MCG tablet; Take 1 tablet (25 mcg total) by mouth daily.  Dispense: 90 tablet; Refill: 3  3. Type 2 diabetes mellitus without complication - UXN23+FTDD - POCT glycosylated hemoglobin (Hb A1C) - POCT UA - Microalbumin - metFORMIN (GLUCOPHAGE) 500 MG tablet; Take 1 tablet (500 mg total) by mouth daily.  Dispense: 90 tablet; Refill: 3  4. Midline thoracic back pain - CMP14+EGFR - cyclobenzaprine (FLEXERIL) 10 MG tablet; TAKE 1 TABLET 3 TIMES A DAY AS NEEDED FOR MUSCLE SPASM  Dispense: 180 tablet; Refill: 3 - DULoxetine (CYMBALTA) 30 MG capsule; Take 2 capsules (60 mg total) by mouth daily.  Dispense: 180 capsule; Refill: 3  5. Hyperlipidemia - CMP14+EGFR - Lipid panel - atorvastatin (LIPITOR) 20 MG tablet; Take 1 tablet (20 mg total) by mouth daily.  Dispense: 90 tablet; Refill: 3  6. Vitamin D deficiency - CMP14+EGFR - Vit D  25 hydroxy (rtn osteoporosis monitoring)  7. Need for hepatitis C screening test - Hepatitis C antibody  8. Essential hypertension - lisinopril (PRINIVIL,ZESTRIL) 20 MG tablet; Take 1 tablet (20 mg total) by mouth daily.  Dispense: 90 tablet; Refill: 3   Continue all meds Labs pending Health Maintenance reviewed Diet and exercise encouraged RTO 2 weeks for HTN  Evelina Dun, FNP

## 2015-05-21 NOTE — Patient Instructions (Addendum)
Health Maintenance Adopting a healthy lifestyle and getting preventive care can go a long way to promote health and wellness. Talk with your health care provider about what schedule of regular examinations is right for you. This is a good chance for you to check in with your provider about disease prevention and staying healthy. In between checkups, there are plenty of things you can do on your own. Experts have done a lot of research about which lifestyle changes and preventive measures are most likely to keep you healthy. Ask your health care provider for more information. WEIGHT AND DIET  Eat a healthy diet  Be sure to include plenty of vegetables, fruits, low-fat dairy products, and lean protein.  Do not eat a lot of foods high in solid fats, added sugars, or salt.  Get regular exercise. This is one of the most important things you can do for your health.  Most adults should exercise for at least 150 minutes each week. The exercise should increase your heart rate and make you sweat (moderate-intensity exercise).  Most adults should also do strengthening exercises at least twice a week. This is in addition to the moderate-intensity exercise.  Maintain a healthy weight  Body mass index (BMI) is a measurement that can be used to identify possible weight problems. It estimates body fat based on height and weight. Your health care provider can help determine your BMI and help you achieve or maintain a healthy weight.  For females 25 years of age and older:   A BMI below 18.5 is considered underweight.  A BMI of 18.5 to 24.9 is normal.  A BMI of 25 to 29.9 is considered overweight.  A BMI of 30 and above is considered obese.  Watch levels of cholesterol and blood lipids  You should start having your blood tested for lipids and cholesterol at 56 years of age, then have this test every 5 years.  You may need to have your cholesterol levels checked more often if:  Your lipid or  cholesterol levels are high.  You are older than 55 years of age.  You are at high risk for heart disease.  CANCER SCREENING   Lung Cancer  Lung cancer screening is recommended for adults 97-92 years old who are at high risk for lung cancer because of a history of smoking.  A yearly low-dose CT scan of the lungs is recommended for people who:  Currently smoke.  Have quit within the past 15 years.  Have at least a 30-pack-year history of smoking. A pack year is smoking an average of one pack of cigarettes a day for 1 year.  Yearly screening should continue until it has been 15 years since you quit.  Yearly screening should stop if you develop a health problem that would prevent you from having lung cancer treatment.  Breast Cancer  Practice breast self-awareness. This means understanding how your breasts normally appear and feel.  It also means doing regular breast self-exams. Let your health care provider know about any changes, no matter how small.  If you are in your 20s or 30s, you should have a clinical breast exam (CBE) by a health care provider every 1-3 years as part of a regular health exam.  If you are 76 or older, have a CBE every year. Also consider having a breast X-ray (mammogram) every year.  If you have a family history of breast cancer, talk to your health care provider about genetic screening.  If you are  at high risk for breast cancer, talk to your health care provider about having an MRI and a mammogram every year.  Breast cancer gene (BRCA) assessment is recommended for women who have family members with BRCA-related cancers. BRCA-related cancers include:  Breast.  Ovarian.  Tubal.  Peritoneal cancers.  Results of the assessment will determine the need for genetic counseling and BRCA1 and BRCA2 testing. Cervical Cancer Routine pelvic examinations to screen for cervical cancer are no longer recommended for nonpregnant women who are considered low  risk for cancer of the pelvic organs (ovaries, uterus, and vagina) and who do not have symptoms. A pelvic examination may be necessary if you have symptoms including those associated with pelvic infections. Ask your health care provider if a screening pelvic exam is right for you.   The Pap test is the screening test for cervical cancer for women who are considered at risk.  If you had a hysterectomy for a problem that was not cancer or a condition that could lead to cancer, then you no longer need Pap tests.  If you are older than 65 years, and you have had normal Pap tests for the past 10 years, you no longer need to have Pap tests.  If you have had past treatment for cervical cancer or a condition that could lead to cancer, you need Pap tests and screening for cancer for at least 20 years after your treatment.  If you no longer get a Pap test, assess your risk factors if they change (such as having a new sexual partner). This can affect whether you should start being screened again.  Some women have medical problems that increase their chance of getting cervical cancer. If this is the case for you, your health care provider may recommend more frequent screening and Pap tests.  The human papillomavirus (HPV) test is another test that may be used for cervical cancer screening. The HPV test looks for the virus that can cause cell changes in the cervix. The cells collected during the Pap test can be tested for HPV.  The HPV test can be used to screen women 30 years of age and older. Getting tested for HPV can extend the interval between normal Pap tests from three to five years.  An HPV test also should be used to screen women of any age who have unclear Pap test results.  After 55 years of age, women should have HPV testing as often as Pap tests.  Colorectal Cancer  This type of cancer can be detected and often prevented.  Routine colorectal cancer screening usually begins at 55 years of  age and continues through 55 years of age.  Your health care provider may recommend screening at an earlier age if you have risk factors for colon cancer.  Your health care provider may also recommend using home test kits to check for hidden blood in the stool.  A small camera at the end of a tube can be used to examine your colon directly (sigmoidoscopy or colonoscopy). This is done to check for the earliest forms of colorectal cancer.  Routine screening usually begins at age 50.  Direct examination of the colon should be repeated every 5-10 years through 55 years of age. However, you may need to be screened more often if early forms of precancerous polyps or small growths are found. Skin Cancer  Check your skin from head to toe regularly.  Tell your health care provider about any new moles or changes in   moles, especially if there is a change in a mole's shape or color.  Also tell your health care provider if you have a mole that is larger than the size of a pencil eraser.  Always use sunscreen. Apply sunscreen liberally and repeatedly throughout the day.  Protect yourself by wearing long sleeves, pants, a wide-brimmed hat, and sunglasses whenever you are outside. HEART DISEASE, DIABETES, AND HIGH BLOOD PRESSURE   Have your blood pressure checked at least every 1-2 years. High blood pressure causes heart disease and increases the risk of stroke.  If you are between 75 years and 42 years old, ask your health care provider if you should take aspirin to prevent strokes.  Have regular diabetes screenings. This involves taking a blood sample to check your fasting blood sugar level.  If you are at a normal weight and have a low risk for diabetes, have this test once every three years after 55 years of age.  If you are overweight and have a high risk for diabetes, consider being tested at a younger age or more often. PREVENTING INFECTION  Hepatitis B  If you have a higher risk for  hepatitis B, you should be screened for this virus. You are considered at high risk for hepatitis B if:  You were born in a country where hepatitis B is common. Ask your health care provider which countries are considered high risk.  Your parents were born in a high-risk country, and you have not been immunized against hepatitis B (hepatitis B vaccine).  You have HIV or AIDS.  You use needles to inject street drugs.  You live with someone who has hepatitis B.  You have had sex with someone who has hepatitis B.  You get hemodialysis treatment.  You take certain medicines for conditions, including cancer, organ transplantation, and autoimmune conditions. Hepatitis C  Blood testing is recommended for:  Everyone born from [birth year] through 1966.  Anyone with known risk factors for hepatitis C. Sexually transmitted infections (STIs)  You should be screened for sexually transmitted infections (STIs) including gonorrhea and chlamydia if:  You are sexually active and are younger than 55 years of age.  You are older than 55 years of age and your health care provider tells you that you are at risk for this type of infection.  Your sexual activity has changed since you were last screened and you are at an increased risk for chlamydia or gonorrhea. Ask your health care provider if you are at risk.  If you do not have HIV, but are at risk, it may be recommended that you take a prescription medicine daily to prevent HIV infection. This is called pre-exposure prophylaxis (PrEP). You are considered at risk if:  You are sexually active and do not regularly use condoms or know the HIV status of your partner(s).  You take drugs by injection.  You are sexually active with a partner who has HIV. Talk with your health care provider about whether you are at high risk of being infected with HIV. If you choose to begin PrEP, you should first be tested for HIV. You should then be tested every 3 months for  as long as you are taking PrEP.  PREGNANCY   If you are premenopausal and you may become pregnant, ask your health care provider about preconception counseling.  If you may become pregnant, take 400 to 800 micrograms (mcg) of folic acid every day.  If you want to prevent pregnancy, talk to your  health care provider about birth control (contraception). OSTEOPOROSIS AND MENOPAUSE   Osteoporosis is a disease in which the bones lose minerals and strength with aging. This can result in serious bone fractures. Your risk for osteoporosis can be identified using a bone density scan.  If you are 38 years of age or older, or if you are at risk for osteoporosis and fractures, ask your health care provider if you should be screened.  Ask your health care provider whether you should take a calcium or vitamin D supplement to lower your risk for osteoporosis.  Menopause may have certain physical symptoms and risks.  Hormone replacement therapy may reduce some of these symptoms and risks. Talk to your health care provider about whether hormone replacement therapy is right for you.  HOME CARE INSTRUCTIONS   Schedule regular health, dental, and eye exams.  Stay current with your immunizations.   Do not use any tobacco products including cigarettes, chewing tobacco, or electronic cigarettes.  If you are pregnant, do not drink alcohol.  If you are breastfeeding, limit how much and how often you drink alcohol.  Limit alcohol intake to no more than 1 drink per day for nonpregnant women. One drink equals 12 ounces of beer, 5 ounces of wine, or 1 ounces of hard liquor.  Do not use street drugs.  Do not share needles.  Ask your health care provider for help if you need support or information about quitting drugs.  Tell your health care provider if you often feel depressed.  Tell your health care provider if you have ever been abused or do not feel safe at home. Document Released: 03/03/2011  Document Revised: 01/02/2014 Document Reviewed: 07/20/2013 [facility] Patient Information 2015 [place], [place]. This information is not intended to replace advice given to you by your health care provider. Make sure you discuss any questions you have with your health care provider. DASH Eating Plan DASH stands for "Dietary Approaches to Stop Hypertension." The DASH eating plan is a healthy eating plan that has been shown to reduce high blood pressure (hypertension). Additional health benefits may include reducing the risk of type 2 diabetes mellitus, heart disease, and stroke. The DASH eating plan may also help with weight loss. WHAT DO I NEED TO KNOW ABOUT THE DASH EATING PLAN? For the DASH eating plan, you will follow these general guidelines:  Choose foods with a percent daily value for sodium of less than 5% (as listed on the food label).  Use salt-free seasonings or herbs instead of table salt or sea salt.  Check with your health care provider or pharmacist before using salt substitutes.  Eat lower-sodium products, often labeled as "lower sodium" or "no salt added."  Eat fresh foods.  Eat more vegetables, fruits, and low-fat dairy products.  Choose whole grains. Look for the word "whole" as the first word in the ingredient list.  Choose fish and skinless chicken or [place] more often than red meat. Limit fish, poultry, and meat to 6 oz (170 g) each day.  Limit sweets, desserts, sugars, and sugary drinks.  Choose heart-healthy fats.  Limit cheese to 1 oz (28 g) per day.  Eat more home-cooked food and less restaurant, buffet, and fast food.  Limit fried foods.  Cook foods using methods other than frying.  Limit canned vegetables. If you do use them, rinse them well to decrease the sodium.  When eating at a restaurant, ask that your food be prepared with less salt, or no salt if possible.  WHAT FOODS CAN I EAT? Seek help from a dietitian for individual calorie  needs. Grains Whole grain or whole wheat bread. Brown rice. Whole grain or whole wheat pasta. Quinoa, bulgur, and whole grain cereals. Low-sodium cereals. Corn or whole wheat flour tortillas. Whole grain cornbread. Whole grain crackers. Low-sodium crackers. Vegetables Fresh or frozen vegetables (raw, steamed, roasted, or grilled). Low-sodium or reduced-sodium tomato and vegetable juices. Low-sodium or reduced-sodium tomato sauce and paste. Low-sodium or reduced-sodium canned vegetables.  Fruits All fresh, canned (in natural juice), or frozen fruits. Meat and Other Protein Products Ground beef (85% or leaner), grass-fed beef, or beef trimmed of fat. Skinless chicken or [place]. Ground chicken or [place]. Pork trimmed of fat. All fish and seafood. Eggs. Dried beans, peas, or lentils. Unsalted nuts and seeds. Unsalted canned beans. Dairy Low-fat dairy products, such as skim or 1% milk, 2% or reduced-fat cheeses, low-fat ricotta or cottage cheese, or plain low-fat yogurt. Low-sodium or reduced-sodium cheeses. Fats and Oils Tub margarines without trans fats. Light or reduced-fat mayonnaise and salad dressings (reduced sodium). Avocado. Safflower, olive, or canola oils. Natural peanut or almond butter. Other Unsalted popcorn and pretzels. The items listed above may not be a complete list of recommended foods or beverages. Contact your dietitian for more options. WHAT FOODS ARE NOT RECOMMENDED? Grains Dokes bread. Bodenheimer pasta. Neyer rice. Refined cornbread. Bagels and croissants. Crackers that contain trans fat. Vegetables Creamed or fried vegetables. Vegetables in a cheese sauce. Regular canned vegetables. Regular canned tomato sauce and paste. Regular tomato and vegetable juices. Fruits Dried fruits. Canned fruit in light or heavy syrup. Fruit juice. Meat and Other Protein Products Fatty cuts of meat. Ribs, chicken wings, bacon, sausage, bologna, salami, chitterlings, fatback, hot dogs, bratwurst,  and packaged luncheon meats. Salted nuts and seeds. Canned beans with salt. Dairy Whole or 2% milk, cream, half-and-half, and cream cheese. Whole-fat or sweetened yogurt. Full-fat cheeses or blue cheese. Nondairy creamers and whipped toppings. Processed cheese, cheese spreads, or cheese curds. Condiments Onion and garlic salt, seasoned salt, table salt, and sea salt. Canned and packaged gravies. Worcestershire sauce. Tartar sauce. Barbecue sauce. Teriyaki sauce. Soy sauce, including reduced sodium. Steak sauce. Fish sauce. Oyster sauce. Cocktail sauce. Horseradish. Ketchup and mustard. Meat flavorings and tenderizers. Bouillon cubes. Hot sauce. Tabasco sauce. Marinades. Taco seasonings. Relishes. Fats and Oils Butter, stick margarine, lard, shortening, ghee, and bacon fat. Coconut, palm kernel, or palm oils. Regular salad dressings. Other Pickles and olives. Salted popcorn and pretzels. The items listed above may not be a complete list of foods and beverages to avoid. Contact your dietitian for more information. WHERE CAN I FIND MORE INFORMATION? National Heart, Lung, and Blood Institute: travelstabloid.com Document Released: 08/07/2011 Document Revised: 01/02/2014 Document Reviewed: 06/22/2013 [facility] Patient Information 2015 [place], [place]. This information is not intended to replace advice given to you by your health care provider. Make sure you discuss any questions you have with your health care provider. Hypertension Hypertension, commonly called high blood pressure, is when the force of blood pumping through your arteries is too strong. Your arteries are the blood vessels that carry blood from your heart throughout your body. A blood pressure reading consists of a higher number over a lower number, such as 110/72. The higher number (systolic) is the pressure inside your arteries when your heart pumps. The lower number (diastolic) is the pressure inside your  arteries when your heart relaxes. Ideally you want your blood pressure below 120/80. Hypertension forces your heart to work harder  to pump blood. Your arteries may become narrow or stiff. Having hypertension puts you at risk for heart disease, stroke, and other problems.  RISK FACTORS Some risk factors for high blood pressure are controllable. Others are not.  Risk factors you cannot control include:   Race. You may be at higher risk if you are African American.  Age. Risk increases with age.  Gender. Men are at higher risk than women before age 45 years. After age 65, women are at higher risk than men. Risk factors you can control include:  Not getting enough exercise or physical activity.  Being overweight.  Getting too much fat, sugar, calories, or salt in your diet.  Drinking too much alcohol. SIGNS AND SYMPTOMS Hypertension does not usually cause signs or symptoms. Extremely high blood pressure (hypertensive crisis) may cause headache, anxiety, shortness of breath, and nosebleed. DIAGNOSIS  To check if you have hypertension, your health care provider will measure your blood pressure while you are seated, with your arm held at the level of your heart. It should be measured at least twice using the same arm. Certain conditions can cause a difference in blood pressure between your right and left arms. A blood pressure reading that is higher than normal on one occasion does not mean that you need treatment. If one blood pressure reading is high, ask your health care provider about having it checked again. TREATMENT  Treating high blood pressure includes making lifestyle changes and possibly taking medicine. Living a healthy lifestyle can help lower high blood pressure. You may need to change some of your habits. Lifestyle changes may include:  Following the DASH diet. This diet is high in fruits, vegetables, and whole grains. It is low in salt, red meat, and added sugars.  Getting at  least 2 hours of brisk physical activity every week.  Losing weight if necessary.  Not smoking.  Limiting alcoholic beverages.  Learning ways to reduce stress. If lifestyle changes are not enough to get your blood pressure under control, your health care provider may prescribe medicine. You may need to take more than one. Work closely with your health care provider to understand the risks and benefits. HOME CARE INSTRUCTIONS  Have your blood pressure rechecked as directed by your health care provider.   Take medicines only as directed by your health care provider. Follow the directions carefully. Blood pressure medicines must be taken as prescribed. The medicine does not work as well when you skip doses. Skipping doses also puts you at risk for problems.   Do not smoke.   Monitor your blood pressure at home as directed by your health care provider. SEEK MEDICAL CARE IF:   You think you are having a reaction to medicines taken.  You have recurrent headaches or feel dizzy.  You have swelling in your ankles.  You have trouble with your vision. SEEK IMMEDIATE MEDICAL CARE IF:  You develop a severe headache or confusion.  You have unusual weakness, numbness, or feel faint.  You have severe chest or abdominal pain.  You vomit repeatedly.  You have trouble breathing. MAKE SURE YOU:   Understand these instructions.  Will watch your condition.  Will get help right away if you are not doing well or get worse. Document Released: 08/18/2005 Document Revised: 01/02/2014 Document Reviewed: 06/10/2013 [employer] Patient Information 2015 [employer]. This information is not intended to replace advice given to you by your health care provider. Make sure you discuss any questions you have with   your health care provider.

## 2015-05-22 ENCOUNTER — Other Ambulatory Visit: Payer: Self-pay | Admitting: Family

## 2015-05-22 LAB — CMP14+EGFR
ALT: 12 IU/L (ref 0–32)
AST: 10 IU/L (ref 0–40)
Albumin/Globulin Ratio: 1.6 (ref 1.1–2.5)
Albumin: 4.1 g/dL (ref 3.5–5.5)
Alkaline Phosphatase: 110 IU/L (ref 39–117)
BUN/Creatinine Ratio: 12 (ref 9–23)
BUN: 6 mg/dL (ref 6–24)
Bilirubin Total: 0.3 mg/dL (ref 0.0–1.2)
CO2: 26 mmol/L (ref 18–29)
Calcium: 9.5 mg/dL (ref 8.7–10.2)
Chloride: 100 mmol/L (ref 97–108)
Creatinine, Ser: 0.49 mg/dL — ABNORMAL LOW (ref 0.57–1.00)
GFR calc Af Amer: 127 mL/min/{1.73_m2} (ref >59)
GFR calc non Af Amer: 110 mL/min/{1.73_m2} (ref >59)
Globulin, Total: 2.5 g/dL (ref 1.5–4.5)
Glucose: 105 mg/dL — ABNORMAL HIGH (ref 65–99)
Potassium: 4.1 mmol/L (ref 3.5–5.2)
Sodium: 141 mmol/L (ref 134–144)
Total Protein: 6.6 g/dL (ref 6.0–8.5)

## 2015-05-22 LAB — LIPID PANEL
Chol/HDL Ratio: 3.1 ratio units (ref 0.0–4.4)
Cholesterol, Total: 161 mg/dL (ref 100–199)
HDL: 52 mg/dL (ref >39)
LDL Calculated: 78 mg/dL (ref 0–99)
Triglycerides: 154 mg/dL — ABNORMAL HIGH (ref 0–149)
VLDL Cholesterol Cal: 31 mg/dL (ref 5–40)

## 2015-05-22 LAB — THYROID PANEL WITH TSH
Free Thyroxine Index: 1.5 (ref 1.2–4.9)
T3 Uptake Ratio: 25 % (ref 24–39)
T4, Total: 6.1 ug/dL (ref 4.5–12.0)
TSH: 4.18 u[IU]/mL (ref 0.450–4.500)

## 2015-05-22 LAB — HEPATITIS C ANTIBODY: Hep C Virus Ab: <0.1 s/co ratio (ref 0.0–0.9)

## 2015-05-22 LAB — VITAMIN D 25 HYDROXY (VIT D DEFICIENCY, FRACTURES): Vit D, 25-Hydroxy: 19.6 ng/mL — ABNORMAL LOW (ref 30.0–100.0)

## 2015-05-22 MED ORDER — METFORMIN HCL 1000 MG PO TABS: 1000.0000 mg | ORAL_TABLET | Freq: Two times a day (BID) | ORAL | Status: DC

## 2015-05-22 MED ORDER — VITAMIN D (ERGOCALCIFEROL) 1.25 MG (50000 UNIT) PO CAPS: 50000.0000 [IU] | ORAL_CAPSULE | ORAL | Status: DC

## 2015-05-25 NOTE — Telephone Encounter (Signed)
Pt aware we received forms on 05/21/15

## 2015-06-12 ENCOUNTER — Ambulatory Visit (INDEPENDENT_AMBULATORY_CARE_PROVIDER_SITE_OTHER): Payer: 59 | Admitting: Family

## 2015-06-12 ENCOUNTER — Encounter: Payer: Self-pay | Admitting: Family

## 2015-06-12 VITALS — BP 127/74 | HR 70 | Temp 97.0°F | Ht 68.0 in | Wt 214.0 lb

## 2015-06-12 DIAGNOSIS — I10 Essential (primary) hypertension: Secondary | ICD-10-CM | POA: Insufficient documentation

## 2015-06-12 MED ORDER — LOSARTAN POTASSIUM 50 MG PO TABS: 50.0000 mg | ORAL_TABLET | Freq: Every day | ORAL | Status: DC

## 2015-06-12 NOTE — Patient Instructions (Signed)
DASH Eating Plan °DASH stands for "Dietary Approaches to Stop Hypertension." The DASH eating plan is a healthy eating plan that has been shown to reduce high blood pressure (hypertension). Additional health benefits may include reducing the risk of type 2 diabetes mellitus, heart disease, and stroke. The DASH eating plan may also help with weight loss. °WHAT DO I NEED TO KNOW ABOUT THE DASH EATING PLAN? °For the DASH eating plan, you will follow these general guidelines: °· Choose foods with a percent daily value for sodium of less than 5% (as listed on the food label). °· Use salt-free seasonings or herbs instead of table salt or sea salt. °· Check with your health care provider or pharmacist before using salt substitutes. °· Eat lower-sodium products, often labeled as "lower sodium" or "no salt added." °· Eat fresh foods. °· Eat more vegetables, fruits, and low-fat dairy products. °· Choose whole grains. Look for the word "whole" as the first word in the ingredient list. °· Choose fish and skinless chicken or turkey more often than red meat. Limit fish, poultry, and meat to 6 oz (170 g) each day. °· Limit sweets, desserts, sugars, and sugary drinks. °· Choose heart-healthy fats. °· Limit cheese to 1 oz (28 g) per day. °· Eat more home-cooked food and less restaurant, buffet, and fast food. °· Limit fried foods. °· Cook foods using methods other than frying. °· Limit canned vegetables. If you do use them, rinse them well to decrease the sodium. °· When eating at a restaurant, ask that your food be prepared with less salt, or no salt if possible. °WHAT FOODS CAN I EAT? °Seek help from a dietitian for individual calorie needs. °Grains °Whole grain or whole wheat bread. Brown rice. Whole grain or whole wheat pasta. Quinoa, bulgur, and whole grain cereals. Low-sodium cereals. Corn or whole wheat flour tortillas. Whole grain cornbread. Whole grain crackers. Low-sodium crackers. °Vegetables °Fresh or frozen vegetables  (raw, steamed, roasted, or grilled). Low-sodium or reduced-sodium tomato and vegetable juices. Low-sodium or reduced-sodium tomato sauce and paste. Low-sodium or reduced-sodium canned vegetables.  °Fruits °All fresh, canned (in natural juice), or frozen fruits. °Meat and Other Protein Products °Ground beef (85% or leaner), grass-fed beef, or beef trimmed of fat. Skinless chicken or turkey. Ground chicken or turkey. Pork trimmed of fat. All fish and seafood. Eggs. Dried beans, peas, or lentils. Unsalted nuts and seeds. Unsalted canned beans. °Dairy °Low-fat dairy products, such as skim or 1% milk, 2% or reduced-fat cheeses, low-fat ricotta or cottage cheese, or plain low-fat yogurt. Low-sodium or reduced-sodium cheeses. °Fats and Oils °Tub margarines without trans fats. Light or reduced-fat mayonnaise and salad dressings (reduced sodium). Avocado. Safflower, olive, or canola oils. Natural peanut or almond butter. °Other °Unsalted popcorn and pretzels. °The items listed above may not be a complete list of recommended foods or beverages. Contact your dietitian for more options. °WHAT FOODS ARE NOT RECOMMENDED? °Grains °White bread. White pasta. White rice. Refined cornbread. Bagels and croissants. Crackers that contain trans fat. °Vegetables °Creamed or fried vegetables. Vegetables in a cheese sauce. Regular canned vegetables. Regular canned tomato sauce and paste. Regular tomato and vegetable juices. °Fruits °Dried fruits. Canned fruit in light or heavy syrup. Fruit juice. °Meat and Other Protein Products °Fatty cuts of meat. Ribs, chicken wings, bacon, sausage, bologna, salami, chitterlings, fatback, hot dogs, bratwurst, and packaged luncheon meats. Salted nuts and seeds. Canned beans with salt. °Dairy °Whole or 2% milk, cream, half-and-half, and cream cheese. Whole-fat or sweetened yogurt. Full-fat   cheeses or blue cheese. Nondairy creamers and whipped toppings. Processed cheese, cheese spreads, or cheese  curds. °Condiments °Onion and garlic salt, seasoned salt, table salt, and sea salt. Canned and packaged gravies. Worcestershire sauce. Tartar sauce. Barbecue sauce. Teriyaki sauce. Soy sauce, including reduced sodium. Steak sauce. Fish sauce. Oyster sauce. Cocktail sauce. Horseradish. Ketchup and mustard. Meat flavorings and tenderizers. Bouillon cubes. Hot sauce. Tabasco sauce. Marinades. Taco seasonings. Relishes. °Fats and Oils °Butter, stick margarine, lard, shortening, ghee, and bacon fat. Coconut, palm kernel, or palm oils. Regular salad dressings. °Other °Pickles and olives. Salted popcorn and pretzels. °The items listed above may not be a complete list of foods and beverages to avoid. Contact your dietitian for more information. °WHERE CAN I FIND MORE INFORMATION? °[employer], [employer]: www.nhlbi.nih.gov/health/health-topics/topics/dash/ °  °This information is not intended to replace advice given to you by your health care provider. Make sure you discuss any questions you have with your health care provider. °  °Document Released: 08/07/2011 Document Revised: 09/08/2014 Document Reviewed: 06/22/2013 °Elsevier Interactive Patient Education ©2016 [employer] ° °Hypertension °Hypertension, commonly called high blood pressure, is when the force of blood pumping through your arteries is too strong. Your arteries are the blood vessels that carry blood from your heart throughout your body. A blood pressure reading consists of a higher number over a lower number, such as 110/72. The higher number (systolic) is the pressure inside your arteries when your heart pumps. The lower number (diastolic) is the pressure inside your arteries when your heart relaxes. Ideally you want your blood pressure below 120/80. °Hypertension forces your heart to work harder to pump blood. Your arteries may become narrow or stiff. Having untreated or uncontrolled hypertension can cause heart attack, stroke, kidney  disease, and other problems. °RISK FACTORS °Some risk factors for high blood pressure are controllable. Others are not.  °Risk factors you cannot control include:  °· Race. You may be at higher risk if you are African American. °· Age. Risk increases with age. °· Gender. Men are at higher risk than women before age 45 years. After age 65, women are at higher risk than men. °Risk factors you can control include: °· Not getting enough exercise or physical activity. °· Being overweight. °· Getting too much fat, sugar, calories, or salt in your diet. °· Drinking too much alcohol. °SIGNS AND SYMPTOMS °Hypertension does not usually cause signs or symptoms. Extremely high blood pressure (hypertensive crisis) may cause headache, anxiety, shortness of breath, and nosebleed. °DIAGNOSIS °To check if you have hypertension, your health care provider will measure your blood pressure while you are seated, with your arm held at the level of your heart. It should be measured at least twice using the same arm. Certain conditions can cause a difference in blood pressure between your right and left arms. A blood pressure reading that is higher than normal on one occasion does not mean that you need treatment. If it is not clear whether you have high blood pressure, you may be asked to return on a different day to have your blood pressure checked again. Or, you may be asked to monitor your blood pressure at home for 1 or more weeks. °TREATMENT °Treating high blood pressure includes making lifestyle changes and possibly taking medicine. Living a healthy lifestyle can help lower high blood pressure. You may need to change some of your habits. °Lifestyle changes may include: °· Following the DASH diet. This diet is high in fruits, vegetables, and whole   grains. It is low in salt, red meat, and added sugars. °· Keep your sodium intake below 2,300 mg per day. °· Getting at least 30-45 minutes of aerobic exercise at least 4 times per  week. °· Losing weight if necessary. °· Not smoking. °· Limiting alcoholic beverages. °· Learning ways to reduce stress. °Your health care provider may prescribe medicine if lifestyle changes are not enough to get your blood pressure under control, and if one of the following is true: °· You are 18-59 years of age and your systolic blood pressure is above 140. °· You are 60 years of age or older, and your systolic blood pressure is above 150. °· Your diastolic blood pressure is above 90. °· You have diabetes, and your systolic blood pressure is over 140 or your diastolic blood pressure is over 90. °· You have kidney disease and your blood pressure is above 140/90. °· You have heart disease and your blood pressure is above 140/90. °Your personal target blood pressure may vary depending on your medical conditions, your age, and other factors. °HOME CARE INSTRUCTIONS °· Have your blood pressure rechecked as directed by your health care provider.   °· Take medicines only as directed by your health care provider. Follow the directions carefully. Blood pressure medicines must be taken as prescribed. The medicine does not work as well when you skip doses. Skipping doses also puts you at risk for problems. °· Do not smoke.   °· Monitor your blood pressure at home as directed by your health care provider.  °SEEK MEDICAL CARE IF:  °· You think you are having a reaction to medicines taken. °· You have recurrent headaches or feel dizzy. °· You have swelling in your ankles. °· You have trouble with your vision. °SEEK IMMEDIATE MEDICAL CARE IF: °· You develop a severe headache or confusion. °· You have unusual weakness, numbness, or feel faint. °· You have severe chest or abdominal pain. °· You vomit repeatedly. °· You have trouble breathing. °MAKE SURE YOU:  °· Understand these instructions. °· Will watch your condition. °· Will get help right away if you are not doing well or get worse. °  °This information is not intended to  replace advice given to you by your health care provider. Make sure you discuss any questions you have with your health care provider. °  °Document Released: 08/18/2005 Document Revised: 01/02/2015 Document Reviewed: 06/10/2013 °Elsevier Interactive Patient Education ©2016 Elsevier Inc. ° °

## 2015-06-12 NOTE — Progress Notes (Signed)
   Subjective:    Patient ID: Brenda Meyer, female    DOB: 09-14-1960, 54 y.o.   MRN: [id number]  Pt presents to the office today to recheck HTN. Pt's BP is at goal today!! However, pt is complaining of a dry cough since starting the Lisinopril. Hypertension This is a chronic problem. The current episode started more than 1 year ago. The problem has been resolved since onset. The problem is controlled. Pertinent negatives include no anxiety, headaches, malaise/fatigue, palpitations, peripheral edema or shortness of breath. Risk factors for coronary artery disease include dyslipidemia, post-menopausal state, obesity, sedentary lifestyle and family history. Past treatments include ACE inhibitors. The current treatment provides significant improvement. There is no history of kidney disease, CAD/MI, CVA, heart failure or a thyroid problem. There is no history of sleep apnea.      Review of Systems  Constitutional: Negative.  Negative for malaise/fatigue.  HENT: Negative.   Eyes: Negative.   Respiratory: Negative.  Negative for shortness of breath.   Cardiovascular: Negative.  Negative for palpitations.  Gastrointestinal: Negative.   Endocrine: Negative.   Genitourinary: Negative.   Musculoskeletal: Negative.   Neurological: Negative.  Negative for headaches.  Hematological: Negative.   Psychiatric/Behavioral: Negative.   All other systems reviewed and are negative.      Objective:   Physical Exam  Constitutional: She is oriented to person, place, and time. She appears well-developed and well-nourished. No distress.  HENT:  Head: Normocephalic and atraumatic.  Right Ear: External ear normal.  Left Ear: External ear normal.  Nose: Nose normal.  Mouth/Throat: Oropharynx is clear and moist.  Eyes: Pupils are equal, round, and reactive to light.  Neck: Normal range of motion. Neck supple. No thyromegaly present.  Cardiovascular: Normal rate, regular rhythm, normal heart sounds and  intact distal pulses.   No murmur heard. Pulmonary/Chest: Effort normal and breath sounds normal. No respiratory distress. She has no wheezes.  Abdominal: Soft. Bowel sounds are normal. She exhibits no distension. There is no tenderness.  Musculoskeletal: Normal range of motion. She exhibits no edema or tenderness.  Neurological: She is alert and oriented to person, place, and time. She has normal reflexes. No cranial nerve deficit.  Skin: Skin is warm and dry.  Psychiatric: She has a normal mood and affect. Her behavior is normal. Judgment and thought content normal.  Vitals reviewed.     BP 127/74 mmHg  Pulse 70  Temp(Src) 97 F (36.1 C) (Oral)  Ht $R'5\' 8"'KV$  (1.727 m)  Wt 214 lb (97.07 kg)  BMI 32.55 kg/m2     Assessment & Plan:  1. Essential hypertension -Pt's Lisinopril stopped today related to cough -Pt started on losartan 50 mg today --Daily blood pressure log given with instructions on how to fill out and told to bring to next visit -Dash diet information given -Exercise encouraged - Stress Management  -Continue current meds -RTO in 2 weeks  - losartan (COZAAR) 50 MG tablet; Take 1 tablet (50 mg total) by mouth daily.  Dispense: 90 tablet; Refill: 3 - BMP8+EGFR  Evelina Dun, FNP

## 2015-06-13 LAB — BMP8+EGFR
BUN/Creatinine Ratio: 9 (ref 9–23)
BUN: 6 mg/dL (ref 6–24)
CO2: 24 mmol/L (ref 18–29)
Calcium: 9.7 mg/dL (ref 8.7–10.2)
Chloride: 97 mmol/L (ref 97–108)
Creatinine, Ser: 0.64 mg/dL (ref 0.57–1.00)
GFR calc Af Amer: 116 mL/min/{1.73_m2} (ref >59)
GFR calc non Af Amer: 101 mL/min/{1.73_m2} (ref >59)
Glucose: 89 mg/dL (ref 65–99)
Potassium: 4.5 mmol/L (ref 3.5–5.2)
Sodium: 138 mmol/L (ref 134–144)

## 2015-06-14 ENCOUNTER — Ambulatory Visit: Payer: 59 | Admitting: Family

## 2015-07-02 ENCOUNTER — Ambulatory Visit (INDEPENDENT_AMBULATORY_CARE_PROVIDER_SITE_OTHER): Payer: 59 | Admitting: Family

## 2015-07-02 ENCOUNTER — Encounter: Payer: Self-pay | Admitting: Family

## 2015-07-02 VITALS — BP 128/74 | HR 75 | Temp 98.5°F | Wt 214.0 lb

## 2015-07-02 DIAGNOSIS — E119 Type 2 diabetes mellitus without complications: Secondary | ICD-10-CM

## 2015-07-02 DIAGNOSIS — I10 Essential (primary) hypertension: Secondary | ICD-10-CM

## 2015-07-02 NOTE — Progress Notes (Signed)
   Subjective:    Patient ID: Brenda Meyer, female    DOB: August 25, 1961, 54 y.o.   MRN: [id number]  Pt presents to the office today to recheck HTN. Pt's BP is at goal today!! Hypertension This is a chronic problem. The current episode started more than 1 year ago. The problem has been resolved since onset. The problem is controlled. Pertinent negatives include no anxiety, headaches, malaise/fatigue, palpitations, peripheral edema or shortness of breath. Risk factors for coronary artery disease include dyslipidemia, obesity, post-menopausal state, sedentary lifestyle and family history. Past treatments include angiotensin blockers. The current treatment provides significant improvement. There is no history of kidney disease, CAD/MI, CVA, heart failure or a thyroid problem. There is no history of sleep apnea.      Review of Systems  Constitutional: Negative.  Negative for malaise/fatigue.  HENT: Negative.   Eyes: Negative.   Respiratory: Negative.  Negative for shortness of breath.   Cardiovascular: Negative.  Negative for palpitations.  Gastrointestinal: Negative.   Endocrine: Negative.   Genitourinary: Negative.   Musculoskeletal: Negative.   Neurological: Negative.  Negative for headaches.  Hematological: Negative.   Psychiatric/Behavioral: Negative.   All other systems reviewed and are negative.      Objective:   Physical Exam  Constitutional: She is oriented to person, place, and time. She appears well-developed and well-nourished. No distress.  HENT:  Head: Normocephalic and atraumatic.  Right Ear: External ear normal.  Left Ear: External ear normal.  Nose: Nose normal.  Mouth/Throat: Oropharynx is clear and moist.  Eyes: Pupils are equal, round, and reactive to light.  Neck: Normal range of motion. Neck supple. No thyromegaly present.  Cardiovascular: Normal rate, regular rhythm, normal heart sounds and intact distal pulses.   No murmur heard. Pulmonary/Chest: Effort  normal and breath sounds normal. No respiratory distress. She has no wheezes.  Abdominal: Soft. Bowel sounds are normal. She exhibits no distension. There is no tenderness.  Musculoskeletal: Normal range of motion. She exhibits edema (trace in BL ankles). She exhibits no tenderness.  Neurological: She is alert and oriented to person, place, and time. She has normal reflexes. No cranial nerve deficit.  Skin: Skin is warm and dry.  Psychiatric: She has a normal mood and affect. Her behavior is normal. Judgment and thought content normal.  Vitals reviewed.   BP 128/74 mmHg  Pulse 75  Temp(Src) 98.5 F (36.9 C) (Oral)  Wt 214 lb (97.07 kg)       Assessment & Plan:  1. Essential hypertension -Dash diet information given -Exercise encouraged - Stress Management  -Continue current meds -RTO in 6 ,months - BMP8+EGFR  2. Type 2 diabetes mellitus without complication, without long-term current use of insulin ([place]) - Ambulatory referral to Ophthalmology  Evelina Dun, FNP

## 2015-07-02 NOTE — Patient Instructions (Signed)

## 2015-07-03 LAB — BMP8+EGFR
BUN/Creatinine Ratio: 10 (ref 9–23)
BUN: 6 mg/dL (ref 6–24)
CO2: 22 mmol/L (ref 18–29)
Calcium: 9.3 mg/dL (ref 8.7–10.2)
Chloride: 99 mmol/L (ref 97–106)
Creatinine, Ser: 0.61 mg/dL (ref 0.57–1.00)
GFR calc Af Amer: 118 mL/min/{1.73_m2} (ref >59)
GFR calc non Af Amer: 102 mL/min/{1.73_m2} (ref >59)
Glucose: 90 mg/dL (ref 65–99)
Potassium: 4.2 mmol/L (ref 3.5–5.2)
Sodium: 140 mmol/L (ref 136–144)

## 2015-08-10 LAB — HM DIABETES EYE EXAM

## 2015-08-14 ENCOUNTER — Encounter: Payer: Self-pay | Admitting: Pulmonary Disease

## 2015-08-14 ENCOUNTER — Ambulatory Visit (INDEPENDENT_AMBULATORY_CARE_PROVIDER_SITE_OTHER): Payer: 59 | Admitting: Pulmonary Disease

## 2015-08-14 VITALS — BP 132/72 | HR 72 | Ht 67.5 in | Wt 215.0 lb

## 2015-08-14 DIAGNOSIS — Z9989 Dependence on other enabling machines and devices: Principal | ICD-10-CM

## 2015-08-14 DIAGNOSIS — G4733 Obstructive sleep apnea (adult) (pediatric): Secondary | ICD-10-CM | POA: Diagnosis not present

## 2015-08-14 NOTE — Progress Notes (Signed)
Current Outpatient Prescriptions on File Prior to Visit  Medication Sig  . aspirin EC 81 MG tablet Take 1 tablet (81 mg total) by mouth daily.  Marland Kitchen. atorvastatin (LIPITOR) 20 MG tablet Take 1 tablet (20 mg total) by mouth daily.  . Calcium Citrate-Vitamin D (CALCIUM + D PO) Take by mouth.  . cyclobenzaprine (FLEXERIL) 10 MG tablet TAKE 1 TABLET 3 TIMES A DAY AS NEEDED FOR MUSCLE SPASM  . diphenhydrAMINE (BENADRYL) 25 MG tablet Take 25 mg by mouth at bedtime as needed for sleep.  . DULoxetine (CYMBALTA) 30 MG capsule Take 2 capsules (60 mg total) by mouth daily.  Marland Kitchen. levothyroxine (SYNTHROID, LEVOTHROID) 25 MCG tablet Take 1 tablet (25 mcg total) by mouth daily.  Marland Kitchen. losartan (COZAAR) 50 MG tablet Take 1 tablet (50 mg total) by mouth daily.  . metFORMIN (GLUCOPHAGE) 1000 MG tablet Take 1 tablet (1,000 mg total) by mouth 2 (two) times daily with a meal.  . Vitamin D, Ergocalciferol, (DRISDOL) 50000 UNITS CAPS capsule Take 1 capsule (50,000 Units total) by mouth every 7 (seven) days.   No current facility-administered medications on file prior to visit.     Chief Complaint  Patient presents with  . Follow-up    Former [facility] pt following for OSA: pt states she is doing well. pt using CPAP every night  for about 7 - 8 hours a night. mask and pressure working well for pt.  no Nurse, learning disabilitydownload availabve. no concerns at this time. DME:  [facility]     Tests PSG 2012 >> [facility] 29  Past medical hx HLD, DM, Hypothyroidism, HTN  Past surgical hx, Allergies, Family hx, Social hx all reviewed.  Vital Signs BP 132/72 mmHg  Pulse 72  Ht 5' 7.5" (1.715 m)  Wt 215 lb (97.523 kg)  BMI 33.16 kg/m2  SpO2 97%  History of Present Illness Brenda Meyer is a 54 y.o. female with OSA.  She was previously followed by Dr. Shelle Ironlance.  She has been doing well with CPAP.  She has full face mask.  She gets about 7 hrs sleep per night.  She uses benadryl to help with sinus drainage and sleep >> has been doing this for  years.  Physical Exam  General - No distress ENT - No sinus tenderness, no oral exudate, no LAN Cardiac - s1s2 regular, no murmur Chest - No wheeze/rales/dullness Back - No focal tenderness Abd - Soft, non-tender Ext - No edema Neuro - Normal strength Skin - No rashes Psych - normal mood, and behavior   Assessment/Plan  Obstructive sleep apnea. Plan: - will get her download and call her with results - she will check whether she is eligible for new device in 2017   Patient Instructions  Will call with results of CPAP download  Can look up following company web sites for CPAP mask options: [employer]med, Resp[employer]ics, [employer], [employer]  Follow up in 1 year     Coralyn HellingVineet Kaylamarie Swickard, MD [facility]Sleep Pager:  [phone]

## 2015-08-14 NOTE — Patient Instructions (Signed)
Will call with results of CPAP download  Can look up following company web sites for CPAP mask options: [employer]med, Resp[employer]ics, [employer], [employer]  Follow up in 1 year

## 2015-08-20 ENCOUNTER — Encounter: Payer: Self-pay | Admitting: *Deleted

## 2015-10-05 ENCOUNTER — Telehealth: Payer: Self-pay | Admitting: Family

## 2015-10-05 NOTE — Telephone Encounter (Signed)
scheduled

## 2015-10-15 MED FILL — DULoxetine HCL 30 MG CPEP: 30 | 90 days supply | Qty: 180 | Fill #1

## 2015-10-15 MED FILL — ATORVASTATIN 20 MG TABLET: 20 | 90 days supply | Qty: 90 | Fill #1

## 2015-11-05 MED FILL — CYCLOBENZAPRINE 10 MG TAB: 10 | 60 days supply | Qty: 180 | Fill #1

## 2015-11-05 MED FILL — LEVOTHYROXINE 25 MCG TABLET: 25 | 90 days supply | Qty: 90 | Fill #1

## 2015-11-14 ENCOUNTER — Telehealth: Payer: Self-pay | Admitting: Family

## 2015-11-19 ENCOUNTER — Ambulatory Visit (INDEPENDENT_AMBULATORY_CARE_PROVIDER_SITE_OTHER): Payer: 59 | Admitting: Family

## 2015-11-19 ENCOUNTER — Encounter: Payer: Self-pay | Admitting: Family

## 2015-11-19 VITALS — BP 133/68 | HR 70 | Temp 97.0°F | Ht 67.5 in | Wt 211.0 lb

## 2015-11-19 DIAGNOSIS — E785 Hyperlipidemia, unspecified: Secondary | ICD-10-CM | POA: Diagnosis not present

## 2015-11-19 DIAGNOSIS — E559 Vitamin D deficiency, unspecified: Secondary | ICD-10-CM | POA: Diagnosis not present

## 2015-11-19 DIAGNOSIS — E119 Type 2 diabetes mellitus without complications: Secondary | ICD-10-CM

## 2015-11-19 DIAGNOSIS — G4733 Obstructive sleep apnea (adult) (pediatric): Secondary | ICD-10-CM

## 2015-11-19 DIAGNOSIS — E039 Hypothyroidism, unspecified: Secondary | ICD-10-CM

## 2015-11-19 DIAGNOSIS — M546 Pain in thoracic spine: Secondary | ICD-10-CM | POA: Diagnosis not present

## 2015-11-19 DIAGNOSIS — I10 Essential (primary) hypertension: Secondary | ICD-10-CM

## 2015-11-19 LAB — BAYER DCA HB A1C WAIVED: HB A1C (BAYER DCA - WAIVED): 6.7 % (ref <7.0)

## 2015-11-19 NOTE — Progress Notes (Signed)
Subjective:    Patient ID: Brenda Meyer, female    DOB: 11-18-1960, 55 y.o.   MRN: [id number]  Pt presents to the office today to recheck HTN. Pt's BP is at goal today!! Hypertension This is a chronic problem. The current episode started more than 1 year ago. The problem has been resolved since onset. The problem is controlled. Pertinent negatives include no anxiety, blurred vision, headaches, malaise/fatigue, palpitations, peripheral edema or shortness of breath. Risk factors for coronary artery disease include dyslipidemia, obesity, post-menopausal state, sedentary lifestyle and family history. Past treatments include angiotensin blockers. The current treatment provides significant improvement. There is no history of kidney disease, CAD/MI, CVA, heart failure or a thyroid problem. There is no history of sleep apnea.  Diabetes She presents for her follow-up diabetic visit. She has type 2 diabetes mellitus. Her disease course has been stable. There are no hypoglycemic associated symptoms. Pertinent negatives for hypoglycemia include no headaches. Pertinent negatives for diabetes include no blurred vision, no fatigue, no foot paresthesias and no foot ulcerations. There are no hypoglycemic complications. Symptoms are stable. There are no diabetic complications. Pertinent negatives for diabetic complications include no CVA, heart disease, nephropathy or peripheral neuropathy. Current diabetic treatment includes oral agent (monotherapy). She is compliant with treatment all of the time. She is following a generally healthy diet. Her breakfast blood glucose range is generally 110-130 mg/dl. An ACE inhibitor/angiotensin II receptor blocker is being taken. Eye exam is current (Jan 2017).  Hyperlipidemia This is a chronic problem. The current episode started more than 1 year ago. The problem is controlled. Recent lipid tests were reviewed and are normal. Exacerbating diseases include diabetes. Associated  symptoms include leg pain. Pertinent negatives include no shortness of breath. Current antihyperlipidemic treatment includes statins. The current treatment provides moderate improvement of lipids. Risk factors for coronary artery disease include diabetes mellitus, dyslipidemia, hypertension, obesity, family history and post-menopausal.  Thyroid Problem Presents for follow-up visit. Symptoms include dry skin. Patient reports no constipation, diarrhea, fatigue, hair loss or palpitations. The symptoms have been stable. Past treatments include levothyroxine. The treatment provided significant relief. Her past medical history is significant for diabetes and hyperlipidemia. There is no history of heart failure.  Back Pain This is a chronic problem. The current episode started more than 1 year ago. The problem occurs intermittently. The problem has been waxing and waning since onset. The pain is present in the thoracic spine. The quality of the pain is described as aching. The pain is at a severity of 5/10. The pain is moderate. The symptoms are aggravated by bending and standing. Associated symptoms include leg pain. Pertinent negatives include no bladder incontinence, bowel incontinence, headaches or numbness. She has tried bed rest and muscle relaxant for the symptoms. The treatment provided moderate relief.      Review of Systems  Constitutional: Negative.  Negative for malaise/fatigue and fatigue.  HENT: Negative.   Eyes: Negative.  Negative for blurred vision.  Respiratory: Negative.  Negative for shortness of breath.   Cardiovascular: Negative.  Negative for palpitations.  Gastrointestinal: Negative.  Negative for diarrhea, constipation and bowel incontinence.  Endocrine: Negative.   Genitourinary: Negative.  Negative for bladder incontinence.  Musculoskeletal: Positive for back pain.  Neurological: Negative.  Negative for numbness and headaches.  Hematological: Negative.     Psychiatric/Behavioral: Negative.   All other systems reviewed and are negative.      Objective:   Physical Exam  Constitutional: She is oriented to person, place, and  time. She appears well-developed and well-nourished. No distress.  HENT:  Head: Normocephalic and atraumatic.  Right Ear: External ear normal.  Left Ear: External ear normal.  Nose: Nose normal.  Mouth/Throat: Oropharynx is clear and moist.  Eyes: Pupils are equal, round, and reactive to light.  Neck: Normal range of motion. Neck supple. No thyromegaly present.  Cardiovascular: Normal rate, regular rhythm, normal heart sounds and intact distal pulses.   No murmur heard. Pulmonary/Chest: Effort normal and breath sounds normal. No respiratory distress. She has no wheezes.  Abdominal: Soft. Bowel sounds are normal. She exhibits no distension. There is no tenderness.  Musculoskeletal: Normal range of motion. She exhibits edema (trace in BL ankles). She exhibits no tenderness.  Neurological: She is alert and oriented to person, place, and time. She has normal reflexes. No cranial nerve deficit.  Skin: Skin is warm and dry.  Psychiatric: She has a normal mood and affect. Her behavior is normal. Judgment and thought content normal.  Vitals reviewed.   BP 133/68 mmHg  Pulse 70  Temp(Src) 97 F (36.1 C) (Oral)  Ht 5' 7.5" (1.715 m)  Wt 211 lb (95.709 kg)  BMI 32.54 kg/m2       Assessment & Plan:  1. Essential hypertension - CMP14+EGFR  2. OSA (obstructive sleep apnea) - CMP14+EGFR  3. Hypothyroidism, unspecified hypothyroidism type - CMP14+EGFR - Thyroid Panel With TSH  4. Type 2 diabetes mellitus without complication, without long-term current use of insulin (HCC) - Bayer DCA Hb A1c Waived - CMP14+EGFR  5. Vitamin D deficiency - CMP14+EGFR - VITAMIN D 25 Hydroxy (Vit-D Deficiency, Fractures)  6. Midline thoracic back pain - CMP14+EGFR  7. Hyperlipidemia - CMP14+EGFR - Lipid panel   Continue  all meds Labs pending Health Maintenance reviewed Diet and exercise encouraged RTO 6 months  Evelina Dun, FNP

## 2015-11-19 NOTE — Patient Instructions (Signed)
Health Maintenance, Female Adopting a healthy lifestyle and getting preventive care can go a long way to promote health and wellness. Talk with your health care provider about what schedule of regular examinations is right for you. This is a good chance for you to check in with your provider about disease prevention and staying healthy. In between checkups, there are plenty of things you can do on your own. Experts have done a lot of research about which lifestyle changes and preventive measures are most likely to keep you healthy. Ask your health care provider for more information. WEIGHT AND DIET  Eat a healthy diet  Be sure to include plenty of vegetables, fruits, low-fat dairy products, and lean protein.  Do not eat a lot of foods high in solid fats, added sugars, or salt.  Get regular exercise. This is one of the most important things you can do for your health.  Most adults should exercise for at least 150 minutes each week. The exercise should increase your heart rate and make you sweat (moderate-intensity exercise).  Most adults should also do strengthening exercises at least twice a week. This is in addition to the moderate-intensity exercise.  Maintain a healthy weight  Body mass index (BMI) is a measurement that can be used to identify possible weight problems. It estimates body fat based on height and weight. Your health care provider can help determine your BMI and help you achieve or maintain a healthy weight.  For females 20 years of age and older:   A BMI below 18.5 is considered underweight.  A BMI of 18.5 to 24.9 is normal.  A BMI of 25 to 29.9 is considered overweight.  A BMI of 30 and above is considered obese.  Watch levels of cholesterol and blood lipids  You should start having your blood tested for lipids and cholesterol at 56 years of age, then have this test every 5 years.  You may need to have your cholesterol levels checked more often if:  Your lipid  or cholesterol levels are high.  You are older than 56 years of age.  You are at high risk for heart disease.  CANCER SCREENING   Lung Cancer  Lung cancer screening is recommended for adults 55-80 years old who are at high risk for lung cancer because of a history of smoking.  A yearly low-dose CT scan of the lungs is recommended for people who:  Currently smoke.  Have quit within the past 15 years.  Have at least a 30-pack-year history of smoking. A pack year is smoking an average of one pack of cigarettes a day for 1 year.  Yearly screening should continue until it has been 15 years since you quit.  Yearly screening should stop if you develop a health problem that would prevent you from having lung cancer treatment.  Breast Cancer  Practice breast self-awareness. This means understanding how your breasts normally appear and feel.  It also means doing regular breast self-exams. Let your health care provider know about any changes, no matter how small.  If you are in your 20s or 30s, you should have a clinical breast exam (CBE) by a health care provider every 1-3 years as part of a regular health exam.  If you are 40 or older, have a CBE every year. Also consider having a breast X-ray (mammogram) every year.  If you have a family history of breast cancer, talk to your health care provider about genetic screening.  If you   are at high risk for breast cancer, talk to your health care provider about having an MRI and a mammogram every year.  Breast cancer gene (BRCA) assessment is recommended for women who have family members with BRCA-related cancers. BRCA-related cancers include:  Breast.  Ovarian.  Tubal.  Peritoneal cancers.  Results of the assessment will determine the need for genetic counseling and BRCA1 and BRCA2 testing. Cervical Cancer Your health care provider may recommend that you be screened regularly for cancer of the pelvic organs (ovaries, uterus, and  vagina). This screening involves a pelvic examination, including checking for microscopic changes to the surface of your cervix (Pap test). You may be encouraged to have this screening done every 3 years, beginning at age 21.  For women ages 30-65, health care providers may recommend pelvic exams and Pap testing every 3 years, or they may recommend the Pap and pelvic exam, combined with testing for human papilloma virus (HPV), every 5 years. Some types of HPV increase your risk of cervical cancer. Testing for HPV may also be done on women of any age with unclear Pap test results.  Other health care providers may not recommend any screening for nonpregnant women who are considered low risk for pelvic cancer and who do not have symptoms. Ask your health care provider if a screening pelvic exam is right for you.  If you have had past treatment for cervical cancer or a condition that could lead to cancer, you need Pap tests and screening for cancer for at least 20 years after your treatment. If Pap tests have been discontinued, your risk factors (such as having a new sexual partner) need to be reassessed to determine if screening should resume. Some women have medical problems that increase the chance of getting cervical cancer. In these cases, your health care provider may recommend more frequent screening and Pap tests. Colorectal Cancer  This type of cancer can be detected and often prevented.  Routine colorectal cancer screening usually begins at 56 years of age and continues through 56 years of age.  Your health care provider may recommend screening at an earlier age if you have risk factors for colon cancer.  Your health care provider may also recommend using home test kits to check for hidden blood in the stool.  A small camera at the end of a tube can be used to examine your colon directly (sigmoidoscopy or colonoscopy). This is done to check for the earliest forms of colorectal  cancer.  Routine screening usually begins at age 50.  Direct examination of the colon should be repeated every 5-10 years through 56 years of age. However, you may need to be screened more often if early forms of precancerous polyps or small growths are found. Skin Cancer  Check your skin from head to toe regularly.  Tell your health care provider about any new moles or changes in moles, especially if there is a change in a mole's shape or color.  Also tell your health care provider if you have a mole that is larger than the size of a pencil eraser.  Always use sunscreen. Apply sunscreen liberally and repeatedly throughout the day.  Protect yourself by wearing long sleeves, pants, a wide-brimmed hat, and sunglasses whenever you are outside. HEART DISEASE, DIABETES, AND HIGH BLOOD PRESSURE   High blood pressure causes heart disease and increases the risk of stroke. High blood pressure is more likely to develop in:  People who have blood pressure in the high end   of the normal range (130-139/85-89 mm Hg).  People who are overweight or obese.  People who are African American.  If you are 38-23 years of age, have your blood pressure checked every 3-5 years. If you are 61 years of age or older, have your blood pressure checked every year. You should have your blood pressure measured twice--once when you are at a hospital or clinic, and once when you are not at a hospital or clinic. Record the average of the two measurements. To check your blood pressure when you are not at a hospital or clinic, you can use:  An automated blood pressure machine at a pharmacy.  A home blood pressure monitor.  If you are between 45 years and 39 years old, ask your health care provider if you should take aspirin to prevent strokes.  Have regular diabetes screenings. This involves taking a blood sample to check your fasting blood sugar level.  If you are at a normal weight and have a low risk for diabetes,  have this test once every three years after 56 years of age.  If you are overweight and have a high risk for diabetes, consider being tested at a younger age or more often. PREVENTING INFECTION  Hepatitis B  If you have a higher risk for hepatitis B, you should be screened for this virus. You are considered at high risk for hepatitis B if:  You were born in a country where hepatitis B is common. Ask your health care provider which countries are considered high risk.  Your parents were born in a high-risk country, and you have not been immunized against hepatitis B (hepatitis B vaccine).  You have HIV or AIDS.  You use needles to inject street drugs.  You live with someone who has hepatitis B.  You have had sex with someone who has hepatitis B.  You get hemodialysis treatment.  You take certain medicines for conditions, including cancer, organ transplantation, and autoimmune conditions. Hepatitis C  Blood testing is recommended for:  Everyone born from [birth year] through 1966.  Anyone with known risk factors for hepatitis C. Sexually transmitted infections (STIs)  You should be screened for sexually transmitted infections (STIs) including gonorrhea and chlamydia if:  You are sexually active and are younger than 56 years of age.  You are older than 56 years of age and your health care provider tells you that you are at risk for this type of infection.  Your sexual activity has changed since you were last screened and you are at an increased risk for chlamydia or gonorrhea. Ask your health care provider if you are at risk.  If you do not have HIV, but are at risk, it may be recommended that you take a prescription medicine daily to prevent HIV infection. This is called pre-exposure prophylaxis (PrEP). You are considered at risk if:  You are sexually active and do not regularly use condoms or know the HIV status of your partner(s).  You take drugs by injection.  You are sexually  active with a partner who has HIV. Talk with your health care provider about whether you are at high risk of being infected with HIV. If you choose to begin PrEP, you should first be tested for HIV. You should then be tested every 3 months for as long as you are taking PrEP.  PREGNANCY   If you are premenopausal and you may become pregnant, ask your health care provider about preconception counseling.  If you may  become pregnant, take 400 to 800 micrograms (mcg) of folic acid every day.  If you want to prevent pregnancy, talk to your health care provider about birth control (contraception). OSTEOPOROSIS AND MENOPAUSE   Osteoporosis is a disease in which the bones lose minerals and strength with aging. This can result in serious bone fractures. Your risk for osteoporosis can be identified using a bone density scan.  If you are 61 years of age or older, or if you are at risk for osteoporosis and fractures, ask your health care provider if you should be screened.  Ask your health care provider whether you should take a calcium or vitamin D supplement to lower your risk for osteoporosis.  Menopause may have certain physical symptoms and risks.  Hormone replacement therapy may reduce some of these symptoms and risks. Talk to your health care provider about whether hormone replacement therapy is right for you.  HOME CARE INSTRUCTIONS   Schedule regular health, dental, and eye exams.  Stay current with your immunizations.   Do not use any tobacco products including cigarettes, chewing tobacco, or electronic cigarettes.  If you are pregnant, do not drink alcohol.  If you are breastfeeding, limit how much and how often you drink alcohol.  Limit alcohol intake to no more than 1 drink per day for nonpregnant women. One drink equals 12 ounces of beer, 5 ounces of wine, or 1 ounces of hard liquor.  Do not use street drugs.  Do not share needles.  Ask your health care provider for help if  you need support or information about quitting drugs.  Tell your health care provider if you often feel depressed.  Tell your health care provider if you have ever been abused or do not feel safe at home.   This information is not intended to replace advice given to you by your health care provider. Make sure you discuss any questions you have with your health care provider.   Document Released: 03/03/2011 Document Revised: 09/08/2014 Document Reviewed: 07/20/2013 Elsevier Interactive Patient Education [employer].

## 2015-11-20 ENCOUNTER — Other Ambulatory Visit: Payer: Self-pay | Admitting: Family

## 2015-11-20 LAB — THYROID PANEL WITH TSH
Free Thyroxine Index: 1.7 (ref 1.2–4.9)
T3 Uptake Ratio: 26 % (ref 24–39)
T4, Total: 6.5 ug/dL (ref 4.5–12.0)
TSH: 5.37 u[IU]/mL — ABNORMAL HIGH (ref 0.450–4.500)

## 2015-11-20 LAB — CMP14+EGFR
ALT: 9 IU/L (ref 0–32)
AST: 13 IU/L (ref 0–40)
Albumin/Globulin Ratio: 1.7 (ref 1.2–2.2)
Albumin: 4.2 g/dL (ref 3.5–5.5)
Alkaline Phosphatase: 112 IU/L (ref 39–117)
BUN/Creatinine Ratio: 13 (ref 9–23)
BUN: 7 mg/dL (ref 6–24)
Bilirubin Total: 0.3 mg/dL (ref 0.0–1.2)
CO2: 26 mmol/L (ref 18–29)
Calcium: 9.7 mg/dL (ref 8.7–10.2)
Chloride: 94 mmol/L — ABNORMAL LOW (ref 96–106)
Creatinine, Ser: 0.54 mg/dL — ABNORMAL LOW (ref 0.57–1.00)
GFR calc Af Amer: 122 mL/min/{1.73_m2} (ref >59)
GFR calc non Af Amer: 106 mL/min/{1.73_m2} (ref >59)
Globulin, Total: 2.5 g/dL (ref 1.5–4.5)
Glucose: 67 mg/dL (ref 65–99)
Potassium: 4.1 mmol/L (ref 3.5–5.2)
Sodium: 137 mmol/L (ref 134–144)
Total Protein: 6.7 g/dL (ref 6.0–8.5)

## 2015-11-20 LAB — VITAMIN D 25 HYDROXY (VIT D DEFICIENCY, FRACTURES): Vit D, 25-Hydroxy: 43.5 ng/mL (ref 30.0–100.0)

## 2015-11-20 LAB — LIPID PANEL
Chol/HDL Ratio: 2.8 ratio units (ref 0.0–4.4)
Cholesterol, Total: 138 mg/dL (ref 100–199)
HDL: 50 mg/dL (ref >39)
LDL Calculated: 59 mg/dL (ref 0–99)
Triglycerides: 145 mg/dL (ref 0–149)
VLDL Cholesterol Cal: 29 mg/dL (ref 5–40)

## 2015-11-20 MED ORDER — LEVOTHYROXINE SODIUM 50 MCG PO TABS: 50.0000 ug | ORAL_TABLET | Freq: Every day | ORAL | Status: DC

## 2015-11-20 MED FILL — LEVOTHYROXINE 50 MCG TABLET: 50 | 90 days supply | Qty: 90 | Fill #0

## 2015-11-22 NOTE — Telephone Encounter (Signed)
noted 

## 2015-11-26 DIAGNOSIS — Z029 Encounter for administrative examinations, unspecified: Secondary | ICD-10-CM

## 2015-11-26 MED FILL — VIT D2 1.25 MG (50,000 UNIT: 1.25 MG | 84 days supply | Qty: 12 | Fill #2

## 2015-11-26 MED FILL — metFORMIN HCL 1000 MG TABS: 1000 | 90 days supply | Qty: 180 | Fill #2

## 2015-11-26 MED FILL — LOSARTAN POTASSIUM 50 MG TA: 50 | 90 days supply | Qty: 90 | Fill #2

## 2015-12-27 DIAGNOSIS — Z01419 Encounter for gynecological examination (general) (routine) without abnormal findings: Secondary | ICD-10-CM | POA: Diagnosis not present

## 2015-12-27 DIAGNOSIS — Z6833 Body mass index (BMI) 33.0-33.9, adult: Secondary | ICD-10-CM | POA: Diagnosis not present

## 2015-12-27 DIAGNOSIS — Z1231 Encounter for screening mammogram for malignant neoplasm of breast: Secondary | ICD-10-CM | POA: Diagnosis not present

## 2016-01-07 MED FILL — CYCLOBENZAPRINE 10 MG TAB: 10 | 60 days supply | Qty: 180 | Fill #2

## 2016-01-07 MED FILL — DULoxetine HCL 30 MG CPEP: 30 | 90 days supply | Qty: 180 | Fill #0

## 2016-01-07 MED FILL — ATORVASTATIN 20 MG TABLET: 20 | 90 days supply | Qty: 90 | Fill #0

## 2016-02-13 MED FILL — VIT D2 1.25 MG (50,000 UNIT: 1.25 MG | 84 days supply | Qty: 12 | Fill #3

## 2016-02-22 DIAGNOSIS — T63481A Toxic effect of venom of other arthropod, accidental (unintentional), initial encounter: Secondary | ICD-10-CM | POA: Diagnosis not present

## 2016-02-22 DIAGNOSIS — J988 Other specified respiratory disorders: Secondary | ICD-10-CM | POA: Diagnosis not present

## 2016-02-22 DIAGNOSIS — E119 Type 2 diabetes mellitus without complications: Secondary | ICD-10-CM | POA: Diagnosis not present

## 2016-02-22 MED FILL — EPINEPHRINE 0.3 MG AUTO-INJ: 0.3 | 25 days supply | Qty: 2 | Fill #0

## 2016-02-22 MED FILL — predniSONE 10 MG TABS: 10 | 6 days supply | Qty: 21 | Fill #0

## 2016-02-25 MED FILL — metFORMIN HCL 1000 MG TABS: 1000 | 90 days supply | Qty: 180 | Fill #3

## 2016-02-25 MED FILL — LOSARTAN POTASSIUM 50 MG TA: 50 | 90 days supply | Qty: 90 | Fill #3

## 2016-03-26 MED FILL — LEVOTHYROXINE 50 MCG TABLET: 50 | 90 days supply | Qty: 90 | Fill #1

## 2016-03-26 MED FILL — CYCLOBENZAPRINE 10 MG TAB: 10 | 60 days supply | Qty: 180 | Fill #3

## 2016-04-21 MED FILL — ATORVASTATIN 20 MG TABLET: 20 | 90 days supply | Qty: 90 | Fill #1

## 2016-04-21 MED FILL — DULoxetine HCL 30 MG CPEP: 30 | 90 days supply | Qty: 180 | Fill #1

## 2016-05-14 ENCOUNTER — Other Ambulatory Visit: Payer: Self-pay | Admitting: Family

## 2016-05-15 MED FILL — VIT D2 1.25 MG (50,000 UNIT: 1.25 MG | 84 days supply | Qty: 12 | Fill #0

## 2016-05-26 ENCOUNTER — Other Ambulatory Visit: Payer: Self-pay | Admitting: Family

## 2016-05-26 DIAGNOSIS — I10 Essential (primary) hypertension: Secondary | ICD-10-CM

## 2016-05-26 MED FILL — LOSARTAN POTASSIUM 50 MG TA: 50 | 90 days supply | Qty: 90 | Fill #0

## 2016-05-26 MED FILL — metFORMIN HCL 1000 MG TABS: 1000 | 90 days supply | Qty: 180 | Fill #0

## 2016-05-26 NOTE — Telephone Encounter (Signed)
Patient likely needs appointment. Go ahead and do refills but she will likely need an appointment soon.

## 2016-06-23 ENCOUNTER — Other Ambulatory Visit: Payer: Self-pay | Admitting: Family

## 2016-06-23 DIAGNOSIS — M546 Pain in thoracic spine: Secondary | ICD-10-CM

## 2016-06-23 MED FILL — LEVOTHYROXINE 50 MCG TABLET: 50 | 90 days supply | Qty: 90 | Fill #2

## 2016-06-24 MED FILL — CYCLOBENZAPRINE 10 MG TAB: 10 | 30 days supply | Qty: 90 | Fill #0

## 2016-07-14 ENCOUNTER — Other Ambulatory Visit: Payer: Self-pay | Admitting: *Deleted

## 2016-07-14 DIAGNOSIS — E7800 Pure hypercholesterolemia, unspecified: Secondary | ICD-10-CM

## 2016-07-14 DIAGNOSIS — M546 Pain in thoracic spine: Secondary | ICD-10-CM

## 2016-07-14 MED ORDER — DULOXETINE HCL 30 MG PO CPEP: 60.0000 mg | ORAL_CAPSULE | Freq: Every day | ORAL | 0 refills | Status: DC

## 2016-07-14 MED ORDER — ATORVASTATIN CALCIUM 20 MG PO TABS: 20.0000 mg | ORAL_TABLET | Freq: Every day | ORAL | 0 refills | Status: DC

## 2016-07-14 MED FILL — ATORVASTATIN 20 MG TABLET: 20 | 90 days supply | Qty: 90 | Fill #0

## 2016-07-14 MED FILL — DULoxetine HCL 30 MG CPEP: 30 | 90 days supply | Qty: 180 | Fill #0

## 2016-07-29 ENCOUNTER — Ambulatory Visit (INDEPENDENT_AMBULATORY_CARE_PROVIDER_SITE_OTHER): Payer: 59 | Admitting: Family

## 2016-07-29 ENCOUNTER — Encounter: Payer: Self-pay | Admitting: Family

## 2016-07-29 VITALS — BP 147/79 | HR 68 | Temp 97.7°F | Ht 67.5 in | Wt 214.4 lb

## 2016-07-29 DIAGNOSIS — J209 Acute bronchitis, unspecified: Secondary | ICD-10-CM

## 2016-07-29 DIAGNOSIS — E785 Hyperlipidemia, unspecified: Secondary | ICD-10-CM

## 2016-07-29 DIAGNOSIS — M546 Pain in thoracic spine: Secondary | ICD-10-CM | POA: Diagnosis not present

## 2016-07-29 DIAGNOSIS — E119 Type 2 diabetes mellitus without complications: Secondary | ICD-10-CM | POA: Diagnosis not present

## 2016-07-29 DIAGNOSIS — I10 Essential (primary) hypertension: Secondary | ICD-10-CM

## 2016-07-29 DIAGNOSIS — E039 Hypothyroidism, unspecified: Secondary | ICD-10-CM | POA: Diagnosis not present

## 2016-07-29 DIAGNOSIS — E669 Obesity, unspecified: Secondary | ICD-10-CM | POA: Diagnosis not present

## 2016-07-29 DIAGNOSIS — E663 Overweight: Secondary | ICD-10-CM | POA: Insufficient documentation

## 2016-07-29 DIAGNOSIS — F411 Generalized anxiety disorder: Secondary | ICD-10-CM

## 2016-07-29 DIAGNOSIS — G4733 Obstructive sleep apnea (adult) (pediatric): Secondary | ICD-10-CM | POA: Diagnosis not present

## 2016-07-29 DIAGNOSIS — G8929 Other chronic pain: Secondary | ICD-10-CM

## 2016-07-29 DIAGNOSIS — E559 Vitamin D deficiency, unspecified: Secondary | ICD-10-CM | POA: Diagnosis not present

## 2016-07-29 LAB — BAYER DCA HB A1C WAIVED: HB A1C (BAYER DCA - WAIVED): 6.8 % (ref <7.0)

## 2016-07-29 MED ORDER — AZITHROMYCIN 250 MG PO TABS: ORAL_TABLET | ORAL | 0 refills | Status: DC

## 2016-07-29 MED ORDER — ESCITALOPRAM OXALATE 10 MG PO TABS: 10.0000 mg | ORAL_TABLET | Freq: Every day | ORAL | 5 refills | Status: DC

## 2016-07-29 MED FILL — AZITHROMYCIN 250 MG TABLET: 250 | 5 days supply | Qty: 6 | Fill #0

## 2016-07-29 MED FILL — ESCITALOPRAM 10 MG TABLET: 10 | 90 days supply | Qty: 90 | Fill #0

## 2016-07-29 NOTE — Progress Notes (Signed)
 Subjective:    Patient ID: Brenda Meyer, female    DOB: 05/08/1961, 55 y.o.   MRN: [id number]  Pt presents to the office today for chronic follow up.  Diabetes  She presents for her follow-up diabetic visit. She has type 2 diabetes mellitus. Her disease course has been stable. Hypoglycemia symptoms include nervousness/anxiousness. Pertinent negatives for hypoglycemia include no confusion or headaches. There are no diabetic associated symptoms. Pertinent negatives for diabetes include no blurred vision, no foot paresthesias, no foot ulcerations, no visual change and no weakness. There are no hypoglycemic complications. Pertinent negatives for hypoglycemia complications include no blackouts. Symptoms are stable. There are no diabetic complications. Pertinent negatives for diabetic complications include no CVA, heart disease, nephropathy or peripheral neuropathy. Risk factors for coronary artery disease include diabetes mellitus, dyslipidemia, hypertension, obesity and family history. Current diabetic treatment includes oral agent (monotherapy). She is following a generally unhealthy diet. Her breakfast blood glucose range is generally 130-140 mg/dl. An ACE inhibitor/angiotensin II receptor blocker is not being taken. Eye exam is current.  Hypertension  This is a new problem. The current episode started today. The problem is unchanged. The problem is uncontrolled. Associated symptoms include anxiety and shortness of breath. Pertinent negatives include no blurred vision, headaches, palpitations or peripheral edema. Risk factors for coronary artery disease include diabetes mellitus, dyslipidemia, obesity, post-menopausal state, sedentary lifestyle and family history. Past treatments include nothing. The current treatment provides no improvement. Hypertensive end-organ damage includes a thyroid problem. There is no history of kidney disease, CAD/MI, CVA or heart failure.  Back Pain  This is a chronic (2004)  problem. The current episode started more than 1 year ago. The problem occurs intermittently. The problem has been waxing and waning since onset. The pain is present in the thoracic spine. The quality of the pain is described as burning. The pain does not radiate. The pain is at a severity of 3/10. The pain is moderate. Pertinent negatives include no bladder incontinence, bowel incontinence, dysuria, fever, headaches, leg pain, numbness, tingling or weakness. She has tried muscle relaxant, NSAIDs and heat (TEDs unit) for the symptoms. The treatment provided moderate relief.  Hyperlipidemia  This is a chronic problem. The current episode started more than 1 year ago. The problem is controlled. Recent lipid tests were reviewed and are normal. Exacerbating diseases include diabetes and obesity. Factors aggravating her hyperlipidemia include fatty foods. Associated symptoms include shortness of breath. Pertinent negatives include no leg pain or myalgias. Current antihyperlipidemic treatment includes statins. The current treatment provides significant improvement of lipids. There are no compliance problems.  Risk factors for coronary artery disease include diabetes mellitus, dyslipidemia, family history, obesity and a sedentary lifestyle.  Thyroid Problem  Presents for follow-up visit. Symptoms include anxiety. Patient reports no constipation, depressed mood, diarrhea, dry skin, hair loss, hoarse voice, palpitations or visual change. The symptoms have been stable. Past treatments include levothyroxine. The treatment provided significant relief. Her past medical history is significant for diabetes and hyperlipidemia. There is no history of heart failure.  Cough  This is a chronic problem. The current episode started 1 to 4 weeks ago. The problem has been gradually worsening. The problem occurs every few minutes. The cough is productive of sputum. Associated symptoms include ear congestion, ear pain (right), nasal  congestion, rhinorrhea, shortness of breath and wheezing. Pertinent negatives include no chills, fever, headaches, myalgias or postnasal drip. The symptoms are aggravated by lying down. She has tried rest and OTC cough suppressant for   the symptoms. The treatment provided mild relief.  Anxiety  Presents for initial visit. Onset was 6 to 12 months ago (pt's daught was diagnosed with breast cancer). The problem has been waxing and waning. Symptoms include excessive worry, irritability, nervous/anxious behavior, restlessness and shortness of breath. Patient reports no confusion, depressed mood or palpitations. Symptoms occur constantly. The severity of symptoms is moderate. The symptoms are aggravated by family issues.   Her past medical history is significant for anxiety/panic attacks and depression. Past treatments include nothing.  OSA PT using CPAP every night. Stable   Review of Systems  Constitutional: Positive for irritability. Negative for chills and fever.  HENT: Positive for ear pain (right) and rhinorrhea. Negative for hoarse voice and postnasal drip.   Eyes: Negative.  Negative for blurred vision.  Respiratory: Positive for cough, shortness of breath and wheezing.   Cardiovascular: Negative.  Negative for palpitations.  Gastrointestinal: Negative.  Negative for bowel incontinence, constipation and diarrhea.  Endocrine: Negative.   Genitourinary: Negative.  Negative for bladder incontinence and dysuria.  Musculoskeletal: Positive for back pain. Negative for myalgias.  Neurological: Negative.  Negative for tingling, weakness, numbness and headaches.  Hematological: Negative.   Psychiatric/Behavioral: Negative for confusion. The patient is nervous/anxious.   All other systems reviewed and are negative.      Objective:   Physical Exam  Constitutional: She is oriented to person, place, and time. She appears well-developed and well-nourished. No distress.  HENT:  Head: Normocephalic  and atraumatic.  Right Ear: External ear normal.  Left Ear: External ear normal.  Nose: Rhinorrhea and nose lacerations present.  Mouth/Throat: Posterior oropharyngeal erythema present.  Eyes: Pupils are equal, round, and reactive to light.  Neck: Normal range of motion. Neck supple. No thyromegaly present.  Cardiovascular: Normal rate, regular rhythm, normal heart sounds and intact distal pulses.   No murmur heard. Pulmonary/Chest: Effort normal and breath sounds normal. No respiratory distress. She has no wheezes.  Intermittent nonproductive cough   Abdominal: Soft. Bowel sounds are normal. She exhibits no distension. There is no tenderness.  Musculoskeletal: Normal range of motion. She exhibits no edema or tenderness.  Neurological: She is alert and oriented to person, place, and time.  Skin: Skin is warm and dry.  Psychiatric: She has a normal mood and affect. Her behavior is normal. Judgment and thought content normal.  Vitals reviewed.   BP (!) 147/79   Pulse 68   Temp 97.7 F (36.5 C) (Oral)   Ht 5' 7.5" (1.715 m)   Wt 214 lb 6.4 oz (97.3 kg)   BMI 33.08 kg/m        Assessment & Plan:  1. Essential hypertension - CMP14+EGFR  2. OSA (obstructive sleep apnea) - CMP14+EGFR  3. Type 2 diabetes mellitus without complication, without long-term current use of insulin (HCC) - Bayer DCA Hb A1c Waived - CMP14+EGFR - Microalbumin / creatinine urine ratio  4. Vitamin D deficiency - CMP14+EGFR - VITAMIN D 25 Hydroxy (Vit-D Deficiency, Fractures)  5. Hyperlipidemia, unspecified hyperlipidemia type - CMP14+EGFR - Lipid panel  6. Chronic midline thoracic back pain - CMP14+EGFR  7. Hypothyroidism, unspecified type - CMP14+EGFR - Thyroid Panel With TSH  8. Obesity (BMI 30-39.9) - CMP14+EGFR  9. GAD (generalized anxiety disorder) -PT started on lexapro 10 mg today Stress management discussed - escitalopram (LEXAPRO) 10 MG tablet; Take 1 tablet (10 mg total) by  mouth daily.  Dispense: 90 tablet; Refill: 5  10. Acute bronchitis, unspecified organism -- Take meds as prescribed -  Use a cool mist humidifier  -Use saline nose sprays frequently -Saline irrigations of the nose can be very helpful if done frequently.  * 4X daily for 1 week*  * Use of a nettie pot can be helpful with this. Follow directions with this* -Force fluids -For any cough or congestion  Use plain Mucinex- regular strength or max strength is fine   * Children- consult with Pharmacist for dosing -For fever or aces or pains- take tylenol or ibuprofen appropriate for age and weight.  * for fevers greater than 101 orally you may alternate ibuprofen and tylenol every  3 hours. -Throat lozenges if help - azithromycin (ZITHROMAX) 250 MG tablet; Take 500 mg once, then 250 mg for four days  Dispense: 6 tablet; Refill: 0   Continue all meds Labs pending Health Maintenance reviewed Diet and exercise encouraged RTO 4-6 weeks to recheck GAD  Evelina Dun, FNP

## 2016-07-29 NOTE — Patient Instructions (Signed)
Generalized Anxiety Disorder Generalized anxiety disorder (GAD) is a mental disorder. It interferes with life functions, including relationships, work, and school. GAD is different from normal anxiety, which everyone experiences at some point in their lives in response to specific life events and activities. Normal anxiety actually helps us prepare for and get through these life events and activities. Normal anxiety goes away after the event or activity is over.  GAD causes anxiety that is not necessarily related to specific events or activities. It also causes excess anxiety in proportion to specific events or activities. The anxiety associated with GAD is also difficult to control. GAD can vary from mild to severe. People with severe GAD can have intense waves of anxiety with physical symptoms (panic attacks).  SYMPTOMS The anxiety and worry associated with GAD are difficult to control. This anxiety and worry are related to many life events and activities and also occur more days than not for 6 months or longer. People with GAD also have three or more of the following symptoms (one or more in children):  Restlessness.   Fatigue.  Difficulty concentrating.   Irritability.  Muscle tension.  Difficulty sleeping or unsatisfying sleep. DIAGNOSIS GAD is diagnosed through an assessment by your health care provider. Your health care provider will ask you questions aboutyour mood,physical symptoms, and events in your life. Your health care provider may ask you about your medical history and use of alcohol or drugs, including prescription medicines. Your health care provider may also do a physical exam and blood tests. Certain medical conditions and the use of certain substances can cause symptoms similar to those associated with GAD. Your health care provider may refer you to a mental health specialist for further evaluation. TREATMENT The following therapies are usually used to treat GAD:    Medication. Antidepressant medication usually is prescribed for long-term daily control. Antianxiety medicines may be added in severe cases, especially when panic attacks occur.   Talk therapy (psychotherapy). Certain types of talk therapy can be helpful in treating GAD by providing support, education, and guidance. A form of talk therapy called cognitive behavioral therapy can teach you healthy ways to think about and react to daily life events and activities.  Stress managementtechniques. These include yoga, meditation, and exercise and can be very helpful when they are practiced regularly. A mental health specialist can help determine which treatment is best for you. Some people see improvement with one therapy. However, other people require a combination of therapies. This information is not intended to replace advice given to you by your health care provider. Make sure you discuss any questions you have with your health care provider. Document Released: 12/13/2012 Document Revised: 09/08/2014 Document Reviewed: 12/13/2012 Elsevier Interactive Patient Education  2017 [employer]  

## 2016-07-30 LAB — MICROALBUMIN / CREATININE URINE RATIO
Creatinine, Urine: 8.1 mg/dL
Microalbumin, Urine: <3 ug/mL

## 2016-07-30 LAB — CMP14+EGFR
ALT: 11 IU/L (ref 0–32)
AST: 11 IU/L (ref 0–40)
Albumin/Globulin Ratio: 1.9 (ref 1.2–2.2)
Albumin: 4.1 g/dL (ref 3.5–5.5)
Alkaline Phosphatase: 108 IU/L (ref 39–117)
BUN/Creatinine Ratio: 7 — ABNORMAL LOW (ref 9–23)
BUN: 4 mg/dL — ABNORMAL LOW (ref 6–24)
Bilirubin Total: <0.2 mg/dL (ref 0.0–1.2)
CO2: 24 mmol/L (ref 18–29)
Calcium: 9.5 mg/dL (ref 8.7–10.2)
Chloride: 102 mmol/L (ref 96–106)
Creatinine, Ser: 0.59 mg/dL (ref 0.57–1.00)
GFR calc Af Amer: 118 mL/min/{1.73_m2} (ref >59)
GFR calc non Af Amer: 103 mL/min/{1.73_m2} (ref >59)
Globulin, Total: 2.2 g/dL (ref 1.5–4.5)
Glucose: 99 mg/dL (ref 65–99)
Potassium: 4 mmol/L (ref 3.5–5.2)
Sodium: 144 mmol/L (ref 134–144)
Total Protein: 6.3 g/dL (ref 6.0–8.5)

## 2016-07-30 LAB — THYROID PANEL WITH TSH
Free Thyroxine Index: 1.8 (ref 1.2–4.9)
T3 Uptake Ratio: 25 % (ref 24–39)
T4, Total: 7 ug/dL (ref 4.5–12.0)
TSH: 3.24 u[IU]/mL (ref 0.450–4.500)

## 2016-07-30 LAB — LIPID PANEL
Chol/HDL Ratio: 2.6 ratio units (ref 0.0–4.4)
Cholesterol, Total: 133 mg/dL (ref 100–199)
HDL: 52 mg/dL (ref >39)
LDL Calculated: 55 mg/dL (ref 0–99)
Triglycerides: 130 mg/dL (ref 0–149)
VLDL Cholesterol Cal: 26 mg/dL (ref 5–40)

## 2016-07-30 LAB — VITAMIN D 25 HYDROXY (VIT D DEFICIENCY, FRACTURES): Vit D, 25-Hydroxy: 50.2 ng/mL (ref 30.0–100.0)

## 2016-08-21 ENCOUNTER — Other Ambulatory Visit: Payer: Self-pay | Admitting: Family Medicine

## 2016-08-21 DIAGNOSIS — I10 Essential (primary) hypertension: Secondary | ICD-10-CM

## 2016-08-21 MED FILL — VIT D2 1.25 MG (50,000 UNIT: 1.25 MG | 84 days supply | Qty: 12 | Fill #1

## 2016-08-21 MED FILL — LOSARTAN POTASSIUM 50 MG TA: 50 | 90 days supply | Qty: 90 | Fill #0

## 2016-08-28 ENCOUNTER — Encounter: Payer: Self-pay | Admitting: Family

## 2016-08-28 ENCOUNTER — Ambulatory Visit (INDEPENDENT_AMBULATORY_CARE_PROVIDER_SITE_OTHER): Payer: 59 | Admitting: Family

## 2016-08-28 DIAGNOSIS — F329 Major depressive disorder, single episode, unspecified: Secondary | ICD-10-CM | POA: Insufficient documentation

## 2016-08-28 DIAGNOSIS — H5203 Hypermetropia, bilateral: Secondary | ICD-10-CM | POA: Diagnosis not present

## 2016-08-28 DIAGNOSIS — F419 Anxiety disorder, unspecified: Secondary | ICD-10-CM | POA: Insufficient documentation

## 2016-08-28 DIAGNOSIS — F32A Depression, unspecified: Secondary | ICD-10-CM

## 2016-08-28 DIAGNOSIS — F418 Other specified anxiety disorders: Secondary | ICD-10-CM | POA: Diagnosis not present

## 2016-08-28 DIAGNOSIS — F320 Major depressive disorder, single episode, mild: Secondary | ICD-10-CM | POA: Insufficient documentation

## 2016-08-28 MED ORDER — ESCITALOPRAM OXALATE 20 MG PO TABS: 20.0000 mg | ORAL_TABLET | Freq: Every day | ORAL | 3 refills | Status: DC

## 2016-08-28 MED FILL — ESCITALOPRAM 20 MG TABLET: 20 | 90 days supply | Qty: 90 | Fill #0

## 2016-08-28 NOTE — Progress Notes (Signed)
   Subjective:    Patient ID: Brenda Meyer, female    DOB: May 26, 1961, 55 y.o.   MRN: [id number]  PT presents to the office today to recheck GAD and Depression.  Depression         This is a chronic problem.  The current episode started more than 1 year ago.   The onset quality is gradual.   The problem occurs intermittently.  The problem has been gradually improving since onset.  Associated symptoms include irritable and restlessness.  Associated symptoms include no decreased concentration, no helplessness, no hopelessness, does not have insomnia and not sad.  Past treatments include SSRIs - Selective serotonin reuptake inhibitors.  Compliance with treatment is good.  Past medical history includes anxiety.   Anxiety  Presents for follow-up visit. Symptoms include depressed mood, excessive worry, irritability, nervous/anxious behavior and restlessness. Patient reports no decreased concentration, insomnia, muscle tension or panic. Symptoms occur constantly.        Review of Systems  Constitutional: Positive for irritability.  Psychiatric/Behavioral: Positive for depression. Negative for decreased concentration. The patient is nervous/anxious. The patient does not have insomnia.   All other systems reviewed and are negative.      Objective:   Physical Exam  Constitutional: She is oriented to person, place, and time. She appears well-developed and well-nourished. She is irritable. No distress.  HENT:  Head: Normocephalic.  Cardiovascular: Normal rate, regular rhythm, normal heart sounds and intact distal pulses.   No murmur heard. Pulmonary/Chest: Effort normal and breath sounds normal. No respiratory distress. She has no wheezes.  Musculoskeletal: Normal range of motion. She exhibits no edema or tenderness.  Neurological: She is alert and oriented to person, place, and time.  Skin: Skin is warm and dry.  Psychiatric: She has a normal mood and affect. Her behavior is normal. Judgment  and thought content normal.  Vitals reviewed.     BP 138/67   Pulse 61   Temp 97.1 F (36.2 C) (Oral)   Ht 5' 7.5" (1.715 m)   Wt 214 lb 12.8 oz (97.4 kg)   BMI 33.15 kg/m      Assessment & Plan:  1. Anxiety and depression -PT's lexapro increased to 20 mg from 10mg  -Stress management discussed RTO in 6 weeks as needed - escitalopram (LEXAPRO) 20 MG tablet; Take 1 tablet (20 mg total) by mouth daily.  Dispense: 90 tablet; Refill: 3   Jannifer Rodneyhristy Jasson Siegmann, FNP

## 2016-08-28 NOTE — Patient Instructions (Signed)
Generalized Anxiety Disorder Generalized anxiety disorder (GAD) is a mental disorder. It interferes with life functions, including relationships, work, and school. GAD is different from normal anxiety, which everyone experiences at some point in their lives in response to specific life events and activities. Normal anxiety actually helps us prepare for and get through these life events and activities. Normal anxiety goes away after the event or activity is over.  GAD causes anxiety that is not necessarily related to specific events or activities. It also causes excess anxiety in proportion to specific events or activities. The anxiety associated with GAD is also difficult to control. GAD can vary from mild to severe. People with severe GAD can have intense waves of anxiety with physical symptoms (panic attacks).  SYMPTOMS The anxiety and worry associated with GAD are difficult to control. This anxiety and worry are related to many life events and activities and also occur more days than not for 6 months or longer. People with GAD also have three or more of the following symptoms (one or more in children):  Restlessness.   Fatigue.  Difficulty concentrating.   Irritability.  Muscle tension.  Difficulty sleeping or unsatisfying sleep. DIAGNOSIS GAD is diagnosed through an assessment by your health care provider. Your health care provider will ask you questions aboutyour mood,physical symptoms, and events in your life. Your health care provider may ask you about your medical history and use of alcohol or drugs, including prescription medicines. Your health care provider may also do a physical exam and blood tests. Certain medical conditions and the use of certain substances can cause symptoms similar to those associated with GAD. Your health care provider may refer you to a mental health specialist for further evaluation. TREATMENT The following therapies are usually used to treat GAD:    Medication. Antidepressant medication usually is prescribed for long-term daily control. Antianxiety medicines may be added in severe cases, especially when panic attacks occur.   Talk therapy (psychotherapy). Certain types of talk therapy can be helpful in treating GAD by providing support, education, and guidance. A form of talk therapy called cognitive behavioral therapy can teach you healthy ways to think about and react to daily life events and activities.  Stress managementtechniques. These include yoga, meditation, and exercise and can be very helpful when they are practiced regularly. A mental health specialist can help determine which treatment is best for you. Some people see improvement with one therapy. However, other people require a combination of therapies. This information is not intended to replace advice given to you by your health care provider. Make sure you discuss any questions you have with your health care provider. Document Released: 12/13/2012 Document Revised: 09/08/2014 Document Reviewed: 12/13/2012 Elsevier Interactive Patient Education  2017 [employer]  

## 2016-08-31 ENCOUNTER — Other Ambulatory Visit: Payer: Self-pay | Admitting: Family Medicine

## 2016-09-02 MED ORDER — METFORMIN HCL 1000 MG PO TABS: 1000.0000 mg | ORAL_TABLET | Freq: Two times a day (BID) | ORAL | 1 refills | Status: DC

## 2016-09-02 MED FILL — metFORMIN HCL 1000 MG TABS: 1000 | 90 days supply | Qty: 180 | Fill #0

## 2016-09-22 ENCOUNTER — Other Ambulatory Visit: Payer: Self-pay | Admitting: Family

## 2016-09-22 DIAGNOSIS — M546 Pain in thoracic spine: Secondary | ICD-10-CM

## 2016-09-22 MED FILL — SYNTHROID 50 MCG TABLET: 50 | 90 days supply | Qty: 90 | Fill #3

## 2016-10-14 ENCOUNTER — Other Ambulatory Visit: Payer: Self-pay | Admitting: Family

## 2016-10-14 DIAGNOSIS — M546 Pain in thoracic spine: Secondary | ICD-10-CM

## 2016-10-14 MED FILL — DULoxetine HCL 30 MG CPEP: 30 | 90 days supply | Qty: 180 | Fill #0

## 2016-10-14 MED FILL — CYCLOBENZAPRINE 10 MG TAB: 10 | 30 days supply | Qty: 90 | Fill #0

## 2016-10-22 ENCOUNTER — Other Ambulatory Visit: Payer: Self-pay | Admitting: *Deleted

## 2016-10-22 DIAGNOSIS — E7800 Pure hypercholesterolemia, unspecified: Secondary | ICD-10-CM

## 2016-10-22 MED ORDER — ATORVASTATIN CALCIUM 20 MG PO TABS: 20.0000 mg | ORAL_TABLET | Freq: Every day | ORAL | 0 refills | Status: DC

## 2016-10-22 MED FILL — ATORVASTATIN 20 MG TABLET: 20 | 90 days supply | Qty: 90 | Fill #0

## 2016-11-12 ENCOUNTER — Other Ambulatory Visit: Payer: Self-pay | Admitting: Family

## 2016-11-12 ENCOUNTER — Ambulatory Visit (INDEPENDENT_AMBULATORY_CARE_PROVIDER_SITE_OTHER): Payer: 59

## 2016-11-12 ENCOUNTER — Encounter: Payer: Self-pay | Admitting: Family

## 2016-11-12 ENCOUNTER — Ambulatory Visit (INDEPENDENT_AMBULATORY_CARE_PROVIDER_SITE_OTHER): Payer: 59 | Admitting: Family

## 2016-11-12 VITALS — BP 113/66 | HR 73 | Temp 98.4°F | Ht 67.5 in | Wt 210.6 lb

## 2016-11-12 DIAGNOSIS — E039 Hypothyroidism, unspecified: Secondary | ICD-10-CM

## 2016-11-12 DIAGNOSIS — Z87891 Personal history of nicotine dependence: Secondary | ICD-10-CM | POA: Insufficient documentation

## 2016-11-12 DIAGNOSIS — E559 Vitamin D deficiency, unspecified: Secondary | ICD-10-CM

## 2016-11-12 DIAGNOSIS — G8929 Other chronic pain: Secondary | ICD-10-CM | POA: Diagnosis not present

## 2016-11-12 DIAGNOSIS — I10 Essential (primary) hypertension: Secondary | ICD-10-CM

## 2016-11-12 DIAGNOSIS — R059 Cough, unspecified: Secondary | ICD-10-CM

## 2016-11-12 DIAGNOSIS — E669 Obesity, unspecified: Secondary | ICD-10-CM | POA: Diagnosis not present

## 2016-11-12 DIAGNOSIS — I700 Atherosclerosis of aorta: Secondary | ICD-10-CM | POA: Insufficient documentation

## 2016-11-12 DIAGNOSIS — E785 Hyperlipidemia, unspecified: Secondary | ICD-10-CM

## 2016-11-12 DIAGNOSIS — F32A Depression, unspecified: Secondary | ICD-10-CM

## 2016-11-12 DIAGNOSIS — G4733 Obstructive sleep apnea (adult) (pediatric): Secondary | ICD-10-CM

## 2016-11-12 DIAGNOSIS — E7800 Pure hypercholesterolemia, unspecified: Secondary | ICD-10-CM

## 2016-11-12 DIAGNOSIS — R05 Cough: Secondary | ICD-10-CM

## 2016-11-12 DIAGNOSIS — M546 Pain in thoracic spine: Secondary | ICD-10-CM | POA: Diagnosis not present

## 2016-11-12 DIAGNOSIS — F418 Other specified anxiety disorders: Secondary | ICD-10-CM | POA: Diagnosis not present

## 2016-11-12 DIAGNOSIS — F419 Anxiety disorder, unspecified: Secondary | ICD-10-CM

## 2016-11-12 DIAGNOSIS — Z0289 Encounter for other administrative examinations: Secondary | ICD-10-CM

## 2016-11-12 DIAGNOSIS — E119 Type 2 diabetes mellitus without complications: Secondary | ICD-10-CM | POA: Diagnosis not present

## 2016-11-12 DIAGNOSIS — F329 Major depressive disorder, single episode, unspecified: Secondary | ICD-10-CM

## 2016-11-12 LAB — BAYER DCA HB A1C WAIVED: HB A1C (BAYER DCA - WAIVED): 6.8 % (ref <7.0)

## 2016-11-12 MED ORDER — ESCITALOPRAM OXALATE 20 MG PO TABS: 20.0000 mg | ORAL_TABLET | Freq: Every day | ORAL | 3 refills | Status: DC

## 2016-11-12 MED ORDER — VITAMIN D (ERGOCALCIFEROL) 1.25 MG (50000 UNIT) PO CAPS: 50000.0000 [IU] | ORAL_CAPSULE | ORAL | 3 refills | Status: DC

## 2016-11-12 MED ORDER — LOSARTAN POTASSIUM 50 MG PO TABS: 50.0000 mg | ORAL_TABLET | Freq: Every day | ORAL | 1 refills | Status: DC

## 2016-11-12 MED ORDER — LEVOTHYROXINE SODIUM 50 MCG PO TABS: 50.0000 ug | ORAL_TABLET | Freq: Every day | ORAL | 3 refills | Status: DC

## 2016-11-12 MED ORDER — ATORVASTATIN CALCIUM 20 MG PO TABS: 20.0000 mg | ORAL_TABLET | Freq: Every day | ORAL | 0 refills | Status: DC

## 2016-11-12 MED ORDER — DULOXETINE HCL 30 MG PO CPEP: 60.0000 mg | ORAL_CAPSULE | Freq: Every day | ORAL | 0 refills | Status: DC

## 2016-11-12 MED ORDER — FLUTICASONE FUROATE-VILANTEROL 100-25 MCG/INH IN AEPB: 1.0000 | INHALATION_SPRAY | Freq: Every day | RESPIRATORY_TRACT | 4 refills | Status: DC

## 2016-11-12 MED ORDER — CYCLOBENZAPRINE HCL 10 MG PO TABS: ORAL_TABLET | ORAL | 1 refills | Status: DC

## 2016-11-12 MED ORDER — METFORMIN HCL 1000 MG PO TABS: 1000.0000 mg | ORAL_TABLET | Freq: Two times a day (BID) | ORAL | 1 refills | Status: DC

## 2016-11-12 MED FILL — VIT D2 1.25 MG (50,000 UNIT: 1.25 MG | 84 days supply | Qty: 12 | Fill #0

## 2016-11-12 MED FILL — BREO ELLIPTA 100-25 MCG INH: 100-25 | 90 days supply | Qty: 180 | Fill #0

## 2016-11-12 MED FILL — CYCLOBENZAPRINE 10 MG TAB: 10 | 30 days supply | Qty: 90 | Fill #0

## 2016-11-12 NOTE — Progress Notes (Signed)
Subjective:    Patient ID: Brenda Meyer, female    DOB: [birth year], 56 y.o.   MRN: [id number]  Pt presents to the office today for chronic follow up.  Diabetes  She presents for her follow-up diabetic visit. She has type 2 diabetes mellitus. Her disease course has been stable. Hypoglycemia symptoms include nervousness/anxiousness. Pertinent negatives for hypoglycemia include no confusion or headaches. There are no diabetic associated symptoms. Pertinent negatives for diabetes include no blurred vision, no foot paresthesias, no foot ulcerations, no visual change and no weakness. There are no hypoglycemic complications. Pertinent negatives for hypoglycemia complications include no blackouts. Symptoms are stable. There are no diabetic complications. Pertinent negatives for diabetic complications include no CVA, heart disease, nephropathy or peripheral neuropathy. Risk factors for coronary artery disease include diabetes mellitus, dyslipidemia, hypertension, obesity and family history. Current diabetic treatment includes oral agent (monotherapy). She is following a generally unhealthy diet. Her breakfast blood glucose range is generally 130-140 mg/dl. An ACE inhibitor/angiotensin II receptor blocker is not being taken. Eye exam is current (Jan 2018).  Hypertension  This is a new problem. The current episode started today. The problem is unchanged. The problem is uncontrolled. Associated symptoms include anxiety, peripheral edema and shortness of breath. Pertinent negatives include no blurred vision, headaches or palpitations. PND: "after working 12 hours" Risk factors for coronary artery disease include diabetes mellitus, dyslipidemia, obesity, post-menopausal state, sedentary lifestyle and family history. Past treatments include nothing. The current treatment provides no improvement. There is no history of kidney disease, CAD/MI, CVA or heart failure. Identifiable causes of hypertension include a thyroid  problem.  Back Pain  This is a chronic (2004) problem. The current episode started more than 1 year ago. The problem occurs intermittently. The problem has been waxing and waning since onset. The pain is present in the thoracic spine. The quality of the pain is described as burning. The pain does not radiate. The pain is at a severity of 4/10. The pain is moderate. Pertinent negatives include no bladder incontinence, bowel incontinence, dysuria, fever, headaches, leg pain, numbness, tingling or weakness. She has tried muscle relaxant, NSAIDs and heat (TEDs unit) for the symptoms. The treatment provided moderate relief.  Hyperlipidemia  This is a chronic problem. The current episode started more than 1 year ago. The problem is controlled. Recent lipid tests were reviewed and are normal. Exacerbating diseases include diabetes and obesity. Factors aggravating her hyperlipidemia include fatty foods. Associated symptoms include shortness of breath. Pertinent negatives include no leg pain or myalgias. Current antihyperlipidemic treatment includes statins. The current treatment provides significant improvement of lipids. There are no compliance problems.  Risk factors for coronary artery disease include diabetes mellitus, dyslipidemia, family history, obesity and a sedentary lifestyle.  Thyroid Problem  Presents for follow-up visit. Symptoms include anxiety. Patient reports no constipation, depressed mood, diarrhea, dry skin, hair loss, hoarse voice, palpitations or visual change. The symptoms have been stable. Past treatments include levothyroxine. The treatment provided significant relief. Her past medical history is significant for diabetes and hyperlipidemia. There is no history of heart failure.  Cough  This is a chronic problem. The current episode started 1 to 4 weeks ago. The problem has been gradually worsening. The problem occurs every few minutes. The cough is productive of sputum. Associated symptoms  include nasal congestion, rhinorrhea, shortness of breath and wheezing. Pertinent negatives include no chills, ear congestion, ear pain, fever, headaches, myalgias or postnasal drip. The symptoms are aggravated by lying down. She has  tried rest and OTC cough suppressant for the symptoms. The treatment provided mild relief. Her past medical history is significant for COPD.  Anxiety  Presents for initial visit. Onset was 6 to 12 months ago (pt's daught was diagnosed with breast cancer). The problem has been waxing and waning. Symptoms include excessive worry, irritability, nervous/anxious behavior, restlessness and shortness of breath. Patient reports no confusion, depressed mood or palpitations. Symptoms occur rarely. The severity of symptoms is moderate. The symptoms are aggravated by family issues.   Her past medical history is significant for anxiety/panic attacks and depression. Past treatments include nothing.  [facility]A PT using CPAP every night. Stable   Review of Systems  Constitutional: Positive for irritability. Negative for chills and fever.  HENT: Positive for rhinorrhea. Negative for ear pain, hoarse voice and postnasal drip.   Eyes: Negative.  Negative for blurred vision.  Respiratory: Positive for cough, shortness of breath and wheezing.   Cardiovascular: Negative.  Negative for palpitations. PND: "after working 12 hours"  Gastrointestinal: Negative.  Negative for bowel incontinence, constipation and diarrhea.  Endocrine: Negative.   Genitourinary: Negative.  Negative for bladder incontinence and dysuria.  Musculoskeletal: Positive for back pain. Negative for myalgias.  Neurological: Negative.  Negative for tingling, weakness, numbness and headaches.  Hematological: Negative.   Psychiatric/Behavioral: Negative for confusion. The patient is nervous/anxious.   All other systems reviewed and are negative.      Objective:   Physical Exam  Constitutional: She is oriented to person,  place, and time. She appears well-developed and well-nourished. No distress.  HENT:  Head: Normocephalic and atraumatic.  Right Ear: External ear normal.  Left Ear: External ear normal.  Nose: Rhinorrhea and nose lacerations present.  Mouth/Throat: Posterior oropharyngeal erythema present.  Eyes: Pupils are equal, round, and reactive to light.  Neck: Normal range of motion. Neck supple. No thyromegaly present.  Cardiovascular: Normal rate, regular rhythm, normal heart sounds and intact distal pulses.   No murmur heard. Pulmonary/Chest: Effort normal and breath sounds normal. No respiratory distress. She has no wheezes.  Intermittent nonproductive cough   Abdominal: Soft. Bowel sounds are normal. She exhibits no distension. There is no tenderness.  Musculoskeletal: Normal range of motion. She exhibits no edema or tenderness.  Neurological: She is alert and oriented to person, place, and time.  Skin: Skin is warm and dry.  Psychiatric: She has a normal mood and affect. Her behavior is normal. Judgment and thought content normal.  Vitals reviewed.   BP 113/66   Pulse 73   Temp 98.4 F (36.9 C) (Oral)   Ht 5' 7.5" (1.715 m)   Wt 210 lb 9.6 oz (95.5 kg)   BMI 32.50 kg/m        Assessment & Plan:  1. Essential hypertension - CMP14+EGFR  2. OSA (obstructive sleep apnea) - CMP14+EGFR  3. Hypothyroidism, unspecified type - CMP14+EGFR - Thyroid Panel With TSH  4. Type 2 diabetes mellitus without complication, without long-term current use of insulin (HCC) - CMP14+EGFR - Bayer DCA Hb A1c Waived  5. Anxiety and depression - CMP14+EGFR  6. Chronic midline thoracic back pain - CMP14+EGFR  7. Hyperlipidemia, unspecified hyperlipidemia type - CMP14+EGFR - Lipid panel  8. Obesity (BMI 30-39.9) - CMP14+EGFR  9. Vitamin D deficiency - CMP14+EGFR  10. Hx of smoking -Started on  Breo today  - fluticasone furoate-vilanterol (BREO ELLIPTA) 100-25 MCG/INH AEPB; Inhale 1  puff into the lungs daily.  Dispense: 3 each; Refill: 4 - DG Chest 2 View; Future -  CMP14+EGFR  11. Cough - fluticasone furoate-vilanterol (BREO ELLIPTA) 100-25 MCG/INH AEPB; Inhale 1 puff into the lungs daily.  Dispense: 3 each; Refill: 4 - DG Chest 2 View; Future - CMP14+EGFR   Continue all meds Labs pending Health Maintenance reviewed Diet and exercise encouraged RTO 3 months  Evelina Dun, FNP

## 2016-11-12 NOTE — Patient Instructions (Signed)
Health Maintenance, Female Adopting a healthy lifestyle and getting preventive care can go a long way to promote health and wellness. Talk with your health care provider about what schedule of regular examinations is right for you. This is a good chance for you to check in with your provider about disease prevention and staying healthy. In between checkups, there are plenty of things you can do on your own. Experts have done a lot of research about which lifestyle changes and preventive measures are most likely to keep you healthy. Ask your health care provider for more information. Weight and diet Eat a healthy diet  Be sure to include plenty of vegetables, fruits, low-fat dairy products, and lean protein.  Do not eat a lot of foods high in solid fats, added sugars, or salt.  Get regular exercise. This is one of the most important things you can do for your health.  Most adults should exercise for at least 150 minutes each week. The exercise should increase your heart rate and make you sweat (moderate-intensity exercise).  Most adults should also do strengthening exercises at least twice a week. This is in addition to the moderate-intensity exercise. Maintain a healthy weight  Body mass index (BMI) is a measurement that can be used to identify possible weight problems. It estimates body fat based on height and weight. Your health care provider can help determine your BMI and help you achieve or maintain a healthy weight.  For females 76 years of age and older:  A BMI below 18.5 is considered underweight.  A BMI of 18.5 to 24.9 is normal.  A BMI of 25 to 29.9 is considered overweight.  A BMI of 30 and above is considered obese. Watch levels of cholesterol and blood lipids  You should start having your blood tested for lipids and cholesterol at 57 years of age, then have this test every 5 years.  You may need to have your cholesterol levels checked more often if:  Your lipid or  cholesterol levels are high.  You are older than 57 years of age.  You are at high risk for heart disease. Cancer screening Lung Cancer  Lung cancer screening is recommended for adults 57-42 years old who are at high risk for lung cancer because of a history of smoking.  A yearly low-dose CT scan of the lungs is recommended for people who:  Currently smoke.  Have quit within the past 15 years.  Have at least a 30-pack-year history of smoking. A pack year is smoking an average of one pack of cigarettes a day for 1 year.  Yearly screening should continue until it has been 15 years since you quit.  Yearly screening should stop if you develop a health problem that would prevent you from having lung cancer treatment. Breast Cancer  Practice breast self-awareness. This means understanding how your breasts normally appear and feel.  It also means doing regular breast self-exams. Let your health care provider know about any changes, no matter how small.  If you are in your 20s or 30s, you should have a clinical breast exam (CBE) by a health care provider every 1-3 years as part of a regular health exam.  If you are 34 or older, have a CBE every year. Also consider having a breast X-ray (mammogram) every year.  If you have a family history of breast cancer, talk to your health care provider about genetic screening.  If you are at high risk for breast cancer, talk  to your health care provider about having an MRI and a mammogram every year.  Breast cancer gene (BRCA) assessment is recommended for women who have family members with BRCA-related cancers. BRCA-related cancers include:  Breast.  Ovarian.  Tubal.  Peritoneal cancers.  Results of the assessment will determine the need for genetic counseling and BRCA1 and BRCA2 testing. Cervical Cancer  Your health care provider may recommend that you be screened regularly for cancer of the pelvic organs (ovaries, uterus, and vagina).  This screening involves a pelvic examination, including checking for microscopic changes to the surface of your cervix (Pap test). You may be encouraged to have this screening done every 3 years, beginning at age 24.  For women ages 66-65, health care providers may recommend pelvic exams and Pap testing every 3 years, or they may recommend the Pap and pelvic exam, combined with testing for human papilloma virus (HPV), every 5 years. Some types of HPV increase your risk of cervical cancer. Testing for HPV may also be done on women of any age with unclear Pap test results.  Other health care providers may not recommend any screening for nonpregnant women who are considered low risk for pelvic cancer and who do not have symptoms. Ask your health care provider if a screening pelvic exam is right for you.  If you have had past treatment for cervical cancer or a condition that could lead to cancer, you need Pap tests and screening for cancer for at least 20 years after your treatment. If Pap tests have been discontinued, your risk factors (such as having a new sexual partner) need to be reassessed to determine if screening should resume. Some women have medical problems that increase the chance of getting cervical cancer. In these cases, your health care provider may recommend more frequent screening and Pap tests. Colorectal Cancer  This type of cancer can be detected and often prevented.  Routine colorectal cancer screening usually begins at 57 years of age and continues through 57 years of age.  Your health care provider may recommend screening at an earlier age if you have risk factors for colon cancer.  Your health care provider may also recommend using home test kits to check for hidden blood in the stool.  A small camera at the end of a tube can be used to examine your colon directly (sigmoidoscopy or colonoscopy). This is done to check for the earliest forms of colorectal cancer.  Routine  screening usually begins at age 41.  Direct examination of the colon should be repeated every 5-10 years through 57 years of age. However, you may need to be screened more often if early forms of precancerous polyps or small growths are found. Skin Cancer  Check your skin from head to toe regularly.  Tell your health care provider about any new moles or changes in moles, especially if there is a change in a mole's shape or color.  Also tell your health care provider if you have a mole that is larger than the size of a pencil eraser.  Always use sunscreen. Apply sunscreen liberally and repeatedly throughout the day.  Protect yourself by wearing long sleeves, pants, a wide-brimmed hat, and sunglasses whenever you are outside. Heart disease, diabetes, and high blood pressure  High blood pressure causes heart disease and increases the risk of stroke. High blood pressure is more likely to develop in:  People who have blood pressure in the high end of the normal range (130-139/85-89 mm Hg).  People who are overweight or obese.  People who are African American.  If you are 59-24 years of age, have your blood pressure checked every 3-5 years. If you are 34 years of age or older, have your blood pressure checked every year. You should have your blood pressure measured twice-once when you are at a hospital or clinic, and once when you are not at a hospital or clinic. Record the average of the two measurements. To check your blood pressure when you are not at a hospital or clinic, you can use:  An automated blood pressure machine at a pharmacy.  A home blood pressure monitor.  If you are between 29 years and 60 years old, ask your health care provider if you should take aspirin to prevent strokes.  Have regular diabetes screenings. This involves taking a blood sample to check your fasting blood sugar level.  If you are at a normal weight and have a low risk for diabetes, have this test once  every three years after 57 years of age.  If you are overweight and have a high risk for diabetes, consider being tested at a younger age or more often. Preventing infection Hepatitis B  If you have a higher risk for hepatitis B, you should be screened for this virus. You are considered at high risk for hepatitis B if:  You were born in a country where hepatitis B is common. Ask your health care provider which countries are considered high risk.  Your parents were born in a high-risk country, and you have not been immunized against hepatitis B (hepatitis B vaccine).  You have HIV or AIDS.  You use needles to inject street drugs.  You live with someone who has hepatitis B.  You have had sex with someone who has hepatitis B.  You get hemodialysis treatment.  You take certain medicines for conditions, including cancer, organ transplantation, and autoimmune conditions. Hepatitis C  Blood testing is recommended for:  Everyone born from [birth year] through 1966.  Anyone with known risk factors for hepatitis C. Sexually transmitted infections (STIs)  You should be screened for sexually transmitted infections (STIs) including gonorrhea and chlamydia if:  You are sexually active and are younger than 57 years of age.  You are older than 57 years of age and your health care provider tells you that you are at risk for this type of infection.  Your sexual activity has changed since you were last screened and you are at an increased risk for chlamydia or gonorrhea. Ask your health care provider if you are at risk.  If you do not have HIV, but are at risk, it may be recommended that you take a prescription medicine daily to prevent HIV infection. This is called pre-exposure prophylaxis (PrEP). You are considered at risk if:  You are sexually active and do not regularly use condoms or know the HIV status of your partner(s).  You take drugs by injection.  You are sexually active with a partner  who has HIV. Talk with your health care provider about whether you are at high risk of being infected with HIV. If you choose to begin PrEP, you should first be tested for HIV. You should then be tested every 3 months for as long as you are taking PrEP. Pregnancy  If you are premenopausal and you may become pregnant, ask your health care provider about preconception counseling.  If you may become pregnant, take 400 to 800 micrograms (mcg) of folic acid  every day.  If you want to prevent pregnancy, talk to your health care provider about birth control (contraception). Osteoporosis and menopause  Osteoporosis is a disease in which the bones lose minerals and strength with aging. This can result in serious bone fractures. Your risk for osteoporosis can be identified using a bone density scan.  If you are 4 years of age or older, or if you are at risk for osteoporosis and fractures, ask your health care provider if you should be screened.  Ask your health care provider whether you should take a calcium or vitamin D supplement to lower your risk for osteoporosis.  Menopause may have certain physical symptoms and risks.  Hormone replacement therapy may reduce some of these symptoms and risks. Talk to your health care provider about whether hormone replacement therapy is right for you. Follow these instructions at home:  Schedule regular health, dental, and eye exams.  Stay current with your immunizations.  Do not use any tobacco products including cigarettes, chewing tobacco, or electronic cigarettes.  If you are pregnant, do not drink alcohol.  If you are breastfeeding, limit how much and how often you drink alcohol.  Limit alcohol intake to no more than 1 drink per day for nonpregnant women. One drink equals 12 ounces of beer, 5 ounces of wine, or 1 ounces of hard liquor.  Do not use street drugs.  Do not share needles.  Ask your health care provider for help if you need support  or information about quitting drugs.  Tell your health care provider if you often feel depressed.  Tell your health care provider if you have ever been abused or do not feel safe at home. This information is not intended to replace advice given to you by your health care provider. Make sure you discuss any questions you have with your health care provider. Document Released: 03/03/2011 Document Revised: 01/24/2016 Document Reviewed: 05/22/2015 Elsevier Interactive Patient Education  2017 [employer].

## 2016-11-13 LAB — CMP14+EGFR
ALT: 14 IU/L (ref 0–32)
AST: 13 IU/L (ref 0–40)
Albumin/Globulin Ratio: 1.8 (ref 1.2–2.2)
Albumin: 4.4 g/dL (ref 3.5–5.5)
Alkaline Phosphatase: 118 IU/L — ABNORMAL HIGH (ref 39–117)
BUN/Creatinine Ratio: 9 (ref 9–23)
BUN: 6 mg/dL (ref 6–24)
Bilirubin Total: 0.4 mg/dL (ref 0.0–1.2)
CO2: 24 mmol/L (ref 18–29)
Calcium: 9.9 mg/dL (ref 8.7–10.2)
Chloride: 93 mmol/L — ABNORMAL LOW (ref 96–106)
Creatinine, Ser: 0.64 mg/dL (ref 0.57–1.00)
GFR calc Af Amer: 115 mL/min/{1.73_m2} (ref >59)
GFR calc non Af Amer: 99 mL/min/{1.73_m2} (ref >59)
Globulin, Total: 2.5 g/dL (ref 1.5–4.5)
Glucose: 81 mg/dL (ref 65–99)
Potassium: 4.4 mmol/L (ref 3.5–5.2)
Sodium: 137 mmol/L (ref 134–144)
Total Protein: 6.9 g/dL (ref 6.0–8.5)

## 2016-11-13 LAB — LIPID PANEL
Chol/HDL Ratio: 2.7 ratio units (ref 0.0–4.4)
Cholesterol, Total: 133 mg/dL (ref 100–199)
HDL: 50 mg/dL (ref >39)
LDL Calculated: 56 mg/dL (ref 0–99)
Triglycerides: 136 mg/dL (ref 0–149)
VLDL Cholesterol Cal: 27 mg/dL (ref 5–40)

## 2016-11-13 LAB — THYROID PANEL WITH TSH
Free Thyroxine Index: 1.7 (ref 1.2–4.9)
T3 Uptake Ratio: 24 % (ref 24–39)
T4, Total: 7.2 ug/dL (ref 4.5–12.0)
TSH: 2.79 u[IU]/mL (ref 0.450–4.500)

## 2016-11-21 MED FILL — LOSARTAN POTASSIUM 50 MG TA: 50 | 90 days supply | Qty: 90 | Fill #1

## 2016-12-02 MED FILL — metFORMIN HCL 1000 MG TABS: 1000 | 90 days supply | Qty: 180 | Fill #1

## 2016-12-10 ENCOUNTER — Encounter: Payer: Self-pay | Admitting: Pulmonary Disease

## 2016-12-10 ENCOUNTER — Ambulatory Visit (INDEPENDENT_AMBULATORY_CARE_PROVIDER_SITE_OTHER): Payer: 59 | Admitting: Pulmonary Disease

## 2016-12-10 VITALS — BP 122/64 | HR 67 | Ht 68.0 in | Wt 212.2 lb

## 2016-12-10 DIAGNOSIS — Z9989 Dependence on other enabling machines and devices: Secondary | ICD-10-CM

## 2016-12-10 DIAGNOSIS — G4733 Obstructive sleep apnea (adult) (pediatric): Secondary | ICD-10-CM | POA: Diagnosis not present

## 2016-12-10 NOTE — Patient Instructions (Signed)
Will arrange for new CPAP machine  Follow up in 3 months 

## 2016-12-10 NOTE — Progress Notes (Signed)
Current Outpatient Prescriptions on File Prior to Visit  Medication Sig  . aspirin EC 81 MG tablet Take 1 tablet (81 mg total) by mouth daily.  Marland Kitchen atorvastatin (LIPITOR) 20 MG tablet Take 1 tablet (20 mg total) by mouth daily.  . Calcium Citrate-Vitamin D (CALCIUM + D PO) Take by mouth.  . cyclobenzaprine (FLEXERIL) 10 MG tablet TAKE 1 TABLET 3 TIMES A DAY AS NEEDED FOR MUSCLE SPASM  . diphenhydrAMINE (BENADRYL) 25 MG tablet Take 25 mg by mouth at bedtime as needed for sleep.  . DULoxetine (CYMBALTA) 30 MG capsule Take 2 capsules (60 mg total) by mouth daily.  Marland Kitchen escitalopram (LEXAPRO) 20 MG tablet Take 1 tablet (20 mg total) by mouth daily.  . fluticasone furoate-vilanterol (BREO ELLIPTA) 100-25 MCG/INH AEPB Inhale 1 puff into the lungs daily.  Marland Kitchen levothyroxine (SYNTHROID, LEVOTHROID) 50 MCG tablet Take 1 tablet (50 mcg total) by mouth daily.  Marland Kitchen losartan (COZAAR) 50 MG tablet Take 1 tablet (50 mg total) by mouth daily.  . metFORMIN (GLUCOPHAGE) 1000 MG tablet Take 1 tablet (1,000 mg total) by mouth 2 (two) times daily with a meal.  . Vitamin D, Ergocalciferol, (DRISDOL) 50000 units CAPS capsule Take 1 capsule (50,000 Units total) by mouth every 7 (seven) days.   No current facility-administered medications on file prior to visit.      Chief Complaint  Patient presents with  . Follow-up    Wears CPAP nightly. Denies problems with mask/pressure. DME: [facility]     Sleep tests PSG 2012 >> AHI 29 Auto CPAP 09/11/16 to 12/09/16 >> used on 90 of 90 nights with average 8 hrs 22 min.  Average AHI 3.1 with median CPAP 12 and 95 th percentile CPAP 14 cm H2O  Past medical history HLD, DM, Hypothyroidism, HTN  Past surgical history, Family history, Social history, Allergies reviewed  Vital Signs BP 122/64 (BP Location: Left Arm, Cuff Size: Normal)   Pulse 67   Ht  (1.727 m)   Wt 212 lb 3.2 oz (96.3 kg)   SpO2 98%   BMI 32.26 kg/m   History of Present Illness Brenda Meyer is a 56 y.o.  female with OSA.  She has been doing well with CPAP.  Has full face mask and fits well.  Uses machine nightly.  Feels rested.  She got her machine in 2012.  Physical Exam  General - pleasant Eyes - pupils reactive ENT - no sinus tenderness, no oral exudate, no LAN Cardiac - regular, no murmur Chest - no wheeze Back - no tenderness Abd - soft, non tender Ext - no edema Neuro - normal strength Skin - no rashes Psych - normal mood   Assessment/Plan  Obstructive sleep apnea. - she is compliant with CPAP and reports benefit - her current device is more than 57 yrs old >> will arrange for new auto CPAP - explained she might need repeat sleep study for insurance coverage of new machine >> she could do home sleep study   Patient Instructions  Will arrange for new CPAP machine  Follow up in 3 months    Coralyn Helling, MD [place] [facility] Pager:  [phone] 12/10/2016, 10:56 AM

## 2016-12-19 DIAGNOSIS — G4733 Obstructive sleep apnea (adult) (pediatric): Secondary | ICD-10-CM | POA: Diagnosis not present

## 2016-12-29 MED FILL — LEVOTHYROXINE 50 MCG TABLET: 50 | 90 days supply | Qty: 90 | Fill #0

## 2016-12-29 MED FILL — ESCITALOPRAM 20 MG TABLET: 20 | 90 days supply | Qty: 90 | Fill #1

## 2016-12-30 DIAGNOSIS — Z6834 Body mass index (BMI) 34.0-34.9, adult: Secondary | ICD-10-CM | POA: Diagnosis not present

## 2016-12-30 DIAGNOSIS — Z1231 Encounter for screening mammogram for malignant neoplasm of breast: Secondary | ICD-10-CM | POA: Diagnosis not present

## 2016-12-30 DIAGNOSIS — Z01419 Encounter for gynecological examination (general) (routine) without abnormal findings: Secondary | ICD-10-CM | POA: Diagnosis not present

## 2017-01-14 MED FILL — ATORVASTATIN 20 MG TABLET: 20 | 90 days supply | Qty: 90 | Fill #0

## 2017-01-14 MED FILL — DULoxetine HCL 30 MG CPEP: 30 | 90 days supply | Qty: 180 | Fill #0

## 2017-01-18 DIAGNOSIS — G4733 Obstructive sleep apnea (adult) (pediatric): Secondary | ICD-10-CM | POA: Diagnosis not present

## 2017-01-29 MED FILL — VIT D2 1.25 MG (50,000 UNIT: 1.25 MG | 84 days supply | Qty: 12 | Fill #1

## 2017-01-29 MED FILL — CYCLOBENZAPRINE 10 MG TAB: 10 | 30 days supply | Qty: 90 | Fill #1

## 2017-02-18 DIAGNOSIS — G4733 Obstructive sleep apnea (adult) (pediatric): Secondary | ICD-10-CM | POA: Diagnosis not present

## 2017-02-23 MED FILL — LOSARTAN POTASSIUM 50 MG TA: 50 | 90 days supply | Qty: 90 | Fill #0

## 2017-02-23 MED FILL — metFORMIN HCL 1000 MG TABS: 1000 | 90 days supply | Qty: 180 | Fill #0

## 2017-03-18 ENCOUNTER — Ambulatory Visit (INDEPENDENT_AMBULATORY_CARE_PROVIDER_SITE_OTHER): Payer: 59 | Admitting: Pulmonary Disease

## 2017-03-18 ENCOUNTER — Other Ambulatory Visit: Payer: Self-pay | Admitting: Family

## 2017-03-18 ENCOUNTER — Encounter: Payer: Self-pay | Admitting: Pulmonary Disease

## 2017-03-18 VITALS — BP 124/70 | HR 65 | Ht 68.0 in | Wt 214.4 lb

## 2017-03-18 DIAGNOSIS — Z9989 Dependence on other enabling machines and devices: Secondary | ICD-10-CM

## 2017-03-18 DIAGNOSIS — G4733 Obstructive sleep apnea (adult) (pediatric): Secondary | ICD-10-CM | POA: Diagnosis not present

## 2017-03-18 MED FILL — LEVOTHYROXINE 50 MCG TABLET: 50 | 90 days supply | Qty: 90 | Fill #1

## 2017-03-18 MED FILL — CYCLOBENZAPRINE 10 MG TAB: 10 | 30 days supply | Qty: 90 | Fill #0

## 2017-03-18 MED FILL — ESCITALOPRAM 20 MG TABLET: 20 | 90 days supply | Qty: 90 | Fill #2

## 2017-03-18 NOTE — Progress Notes (Signed)
Current Outpatient Prescriptions on File Prior to Visit  Medication Sig  . aspirin EC 81 MG tablet Take 1 tablet (81 mg total) by mouth daily.  Marland Kitchen. atorvastatin (LIPITOR) 20 MG tablet Take 1 tablet (20 mg total) by mouth daily.  . Calcium Citrate-Vitamin D (CALCIUM + D PO) Take by mouth.  . cyclobenzaprine (FLEXERIL) 10 MG tablet TAKE 1 TABLET 3 TIMES A DAY AS NEEDED FOR MUSCLE SPASM  . diphenhydrAMINE (BENADRYL) 25 MG tablet Take 25 mg by mouth at bedtime as needed for sleep.  . DULoxetine (CYMBALTA) 30 MG capsule Take 2 capsules (60 mg total) by mouth daily.  Marland Kitchen. escitalopram (LEXAPRO) 20 MG tablet Take 1 tablet (20 mg total) by mouth daily.  Marland Kitchen. levothyroxine (SYNTHROID, LEVOTHROID) 50 MCG tablet Take 1 tablet (50 mcg total) by mouth daily.  Marland Kitchen. losartan (COZAAR) 50 MG tablet Take 1 tablet (50 mg total) by mouth daily.  . metFORMIN (GLUCOPHAGE) 1000 MG tablet Take 1 tablet (1,000 mg total) by mouth 2 (two) times daily with a meal.  . Vitamin D, Ergocalciferol, (DRISDOL) 50000 units CAPS capsule Take 1 capsule (50,000 Units total) by mouth every 7 (seven) days.   No current facility-administered medications on file prior to visit.      Chief Complaint  Patient presents with  . Follow-up    Wears CPAP nightly. Denies problems with mask/pressure. [facility]     Sleep tests PSG 2012 >> AHI 29 Auto CPAP 02/16/17 to 03/17/17 >> used on 30 of 30 nights with average 8 hrs 23 min.  Average AHI 3 with median CPAP 11 and 95 the percentile CPAP 14 cm H2O  Past medical history HLD, DM, Hypothyroidism, HTN  Past surgical history, Family history, Social history, Allergies reviewed  Vital Signs BP 124/70 (BP Location: Left Arm, Cuff Size: Normal)   Pulse 65   Ht 5\' 8"  (1.727 m)   Wt 214 lb 6.4 oz (97.3 kg)   SpO2 95%   BMI 32.60 kg/m   History of Present Illness Brenda Meyer BogusKeaton is a 56 y.o. female with OSA.  Since her last visit she got a new machine.  Working much better.  Occasional mask leak.   Has full face mask.  Gets chest congestion in AM.  Tried breo >> no help.  She does have allergies.  Not using any allergy meds at present.  Physical Exam  General - pleasant Eyes - pupils reactive ENT - no sinus tenderness, no oral exudate, no LAN Cardiac - regular, no murmur Chest - no wheeze, rales Abd - soft, non tender Ext - no edema Skin - no rashes Neuro - normal strength Psych - normal mood   Assessment/Plan  Obstructive sleep apnea. - she is compliant with CPAP and reports benefit - continue auto CPAP  Allergic rhinitis. - discussed environmental control techniques - prn antihistamines   Patient Instructions  Follow up in 1 year    Coralyn HellingVineet Coury Grieger, MD [place] [facility]/Sleep Pager:  [phone] 03/18/2017, 10:23 AM

## 2017-03-18 NOTE — Patient Instructions (Signed)
Follow up in 1 year.

## 2017-03-20 DIAGNOSIS — G4733 Obstructive sleep apnea (adult) (pediatric): Secondary | ICD-10-CM | POA: Diagnosis not present

## 2017-04-15 ENCOUNTER — Other Ambulatory Visit: Payer: Self-pay | Admitting: Family

## 2017-04-15 DIAGNOSIS — M546 Pain in thoracic spine: Secondary | ICD-10-CM

## 2017-04-15 DIAGNOSIS — E7800 Pure hypercholesterolemia, unspecified: Secondary | ICD-10-CM

## 2017-04-15 DIAGNOSIS — F329 Major depressive disorder, single episode, unspecified: Secondary | ICD-10-CM

## 2017-04-15 DIAGNOSIS — F32A Depression, unspecified: Secondary | ICD-10-CM

## 2017-04-15 DIAGNOSIS — F419 Anxiety disorder, unspecified: Secondary | ICD-10-CM

## 2017-04-15 DIAGNOSIS — E785 Hyperlipidemia, unspecified: Secondary | ICD-10-CM

## 2017-04-15 MED FILL — VIT D2 1.25 MG (50,000 UNIT: 1.25 MG | 84 days supply | Qty: 12 | Fill #2

## 2017-04-15 NOTE — Telephone Encounter (Signed)
[facility]

## 2017-04-16 MED FILL — ATORVASTATIN 20 MG TABLET: 20 | 90 days supply | Qty: 90 | Fill #0

## 2017-04-16 MED FILL — DULoxetine HCL 30 MG CPEP: 30 | 90 days supply | Qty: 180 | Fill #0

## 2017-04-20 DIAGNOSIS — G4733 Obstructive sleep apnea (adult) (pediatric): Secondary | ICD-10-CM | POA: Diagnosis not present

## 2017-04-29 ENCOUNTER — Telehealth: Payer: 59 | Admitting: Nurse Practitioner

## 2017-04-29 DIAGNOSIS — B001 Herpesviral vesicular dermatitis: Secondary | ICD-10-CM

## 2017-04-29 MED ORDER — VALACYCLOVIR HCL 1 G PO TABS: ORAL_TABLET | ORAL | 0 refills | Status: DC

## 2017-04-29 MED ORDER — MAGIC MOUTHWASH: 5.0000 mL | Freq: Four times a day (QID) | ORAL | 0 refills | Status: DC

## 2017-04-29 NOTE — Progress Notes (Signed)
We are sorry that you are not feeling well.  Here is how we plan to help!  Based on what you have shared with me it does look like you have a viral infection.    Most cold sores or fever blisters are small fluid filled blisters around the mouth caused by herpes simplex virus.  The most common strain of the virus causing cold sores is herpes simplex virus 1.  It can be spread by skin contact, sharing eating utensils, or even sharing towels.  Cold sores are contagious to other people until dry. (Approximately 5-7 days).  Wash your hands. You can spread the virus to your eyes through handling your contact lenses after touching the lesions.  Most people experience pain at the sight or tingling sensations in their lips that may begin before the ulcers erupt.  Herpes simplex is treatable but not curable.  It may lie dormant for a long time and then reappear due to stress or prolonged sun exposure.  Many patients have success in treating their cold sores with an over the counter topical called Abreva.  You may apply the cream up to 5 times daily (maximum 10 days) until healing occurs.  If you would like to use an oral antiviral medication to speed the healing of your cold sore, I have sent a prescription to your local pharmacy Valacyclovir 2 gm twice daily for 1 day  as well as magic mouthwash swish and swallow 1tsp qid.  HOME CARE:   Wash your hands frequently.  Do not pick at or rub the sore.  Don't open the blisters.  Avoid kissing other people during this time.  Avoid sharing drinking glasses, eating utensils, or razors.  Do not handle contact lenses unless you have thoroughly washed your hands with soap and warm water!  Avoid oral sex during this time.  Herpes from sores on your mouth can spread to your partner's genital area.  Avoid contact with anyone who has eczema or a weakened immune system.  Cold sores are often triggered by exposure to intense sunlight, use a lip balm containing a  sunscreen (SPF 30 or higher).  GET HELP RIGHT AWAY IF:   Blisters look infected.  Blisters occur near or in the eye.  Symptoms last longer than 10 days.  Your symptoms become worse.  MAKE SURE YOU:   Understand these instructions.  Will watch your condition.  Will get help right away if you are not doing well or get worse.    Your e-visit answers were reviewed by a board certified advanced clinical practitioner to complete your personal care plan.  Depending upon the condition, your plan could have  Included both over the counter or prescription medications.    Please review your pharmacy choice.  Be sure that the pharmacy you have chosen is open so that you can pick up your prescription now.  If there is a problem you csn message your provider in MyChart to have the prescription routed to another pharmacy.    Your safety is important to us.  If you have drug allergies check our prescription carefully.  For the next 24 hours you can use MyChart to ask questions about today's visit, request a non-urgent call back, or ask for a work or school excuse from your e-visit provider.  You will get an email in the next two days asking about your experience.  I hope that your e-visit has been valuable and will speed your recovery.

## 2017-05-06 MED FILL — LOSARTAN POTASSIUM 50 MG TA: 50 | 90 days supply | Qty: 90 | Fill #1

## 2017-05-07 ENCOUNTER — Other Ambulatory Visit: Payer: Self-pay | Admitting: *Deleted

## 2017-05-07 MED ORDER — CYCLOBENZAPRINE HCL 10 MG PO TABS: ORAL_TABLET | ORAL | 1 refills | Status: DC

## 2017-05-07 MED FILL — CYCLOBENZAPRINE 10 MG TAB: 10 | 30 days supply | Qty: 90 | Fill #0

## 2017-05-12 ENCOUNTER — Encounter: Payer: Self-pay | Admitting: Family

## 2017-05-12 ENCOUNTER — Ambulatory Visit (INDEPENDENT_AMBULATORY_CARE_PROVIDER_SITE_OTHER): Payer: 59 | Admitting: Family

## 2017-05-12 VITALS — BP 132/72 | HR 75 | Temp 98.8°F | Ht 68.0 in | Wt 218.2 lb

## 2017-05-12 DIAGNOSIS — E559 Vitamin D deficiency, unspecified: Secondary | ICD-10-CM

## 2017-05-12 DIAGNOSIS — F329 Major depressive disorder, single episode, unspecified: Secondary | ICD-10-CM | POA: Diagnosis not present

## 2017-05-12 DIAGNOSIS — E039 Hypothyroidism, unspecified: Secondary | ICD-10-CM | POA: Diagnosis not present

## 2017-05-12 DIAGNOSIS — E7800 Pure hypercholesterolemia, unspecified: Secondary | ICD-10-CM | POA: Diagnosis not present

## 2017-05-12 DIAGNOSIS — M546 Pain in thoracic spine: Secondary | ICD-10-CM | POA: Diagnosis not present

## 2017-05-12 DIAGNOSIS — E785 Hyperlipidemia, unspecified: Secondary | ICD-10-CM | POA: Diagnosis not present

## 2017-05-12 DIAGNOSIS — I10 Essential (primary) hypertension: Secondary | ICD-10-CM | POA: Diagnosis not present

## 2017-05-12 DIAGNOSIS — E119 Type 2 diabetes mellitus without complications: Secondary | ICD-10-CM | POA: Diagnosis not present

## 2017-05-12 DIAGNOSIS — F419 Anxiety disorder, unspecified: Secondary | ICD-10-CM

## 2017-05-12 DIAGNOSIS — E669 Obesity, unspecified: Secondary | ICD-10-CM | POA: Diagnosis not present

## 2017-05-12 DIAGNOSIS — I700 Atherosclerosis of aorta: Secondary | ICD-10-CM

## 2017-05-12 DIAGNOSIS — G8929 Other chronic pain: Secondary | ICD-10-CM

## 2017-05-12 DIAGNOSIS — F32A Depression, unspecified: Secondary | ICD-10-CM

## 2017-05-12 LAB — BAYER DCA HB A1C WAIVED: HB A1C (BAYER DCA - WAIVED): 7 % — ABNORMAL HIGH (ref <7.0)

## 2017-05-12 MED ORDER — DULOXETINE HCL 30 MG PO CPEP: 60.0000 mg | ORAL_CAPSULE | Freq: Every day | ORAL | 0 refills | Status: DC

## 2017-05-12 MED ORDER — METFORMIN HCL 1000 MG PO TABS: 1000.0000 mg | ORAL_TABLET | Freq: Two times a day (BID) | ORAL | 1 refills | Status: DC

## 2017-05-12 MED ORDER — LEVOTHYROXINE SODIUM 50 MCG PO TABS: 50.0000 ug | ORAL_TABLET | Freq: Every day | ORAL | 3 refills | Status: DC

## 2017-05-12 MED ORDER — CYCLOBENZAPRINE HCL 10 MG PO TABS: ORAL_TABLET | ORAL | 1 refills | Status: DC

## 2017-05-12 MED ORDER — ATORVASTATIN CALCIUM 20 MG PO TABS: 20.0000 mg | ORAL_TABLET | Freq: Every day | ORAL | 0 refills | Status: DC

## 2017-05-12 MED ORDER — LOSARTAN POTASSIUM 50 MG PO TABS: 50.0000 mg | ORAL_TABLET | Freq: Every day | ORAL | 1 refills | Status: DC

## 2017-05-12 MED ORDER — VITAMIN D (ERGOCALCIFEROL) 1.25 MG (50000 UNIT) PO CAPS: 50000.0000 [IU] | ORAL_CAPSULE | ORAL | 3 refills | Status: DC

## 2017-05-12 MED ORDER — ESCITALOPRAM OXALATE 20 MG PO TABS: 20.0000 mg | ORAL_TABLET | Freq: Every day | ORAL | 3 refills | Status: DC

## 2017-05-12 NOTE — Patient Instructions (Signed)

## 2017-05-12 NOTE — Progress Notes (Signed)
Subjective:    Patient ID: Brenda Meyer, female    DOB: December 07, 1960, 56 y.o.   MRN: [id number]  PT presents to the office today for chronic follow up and FMLA paperwork.  Back Pain  This is a chronic problem. The current episode started more than 1 year ago. The problem occurs intermittently. The problem has been waxing and waning since onset. The pain is present in the thoracic spine. The quality of the pain is described as aching. The pain is at a severity of 5/10. The pain is moderate. The symptoms are aggravated by bending. Pertinent negatives include no bladder incontinence, bowel incontinence, dysuria or leg pain. She has tried bed rest, muscle relaxant and NSAIDs for the symptoms. The treatment provided moderate relief.  Diabetes  She presents for her follow-up diabetic visit. She has type 2 diabetes mellitus. Her disease course has been stable. Hypoglycemia symptoms include nervousness/anxiousness. Associated symptoms include fatigue. Pertinent negatives for diabetes include no blurred vision, no foot paresthesias, no foot ulcerations and no visual change. There are no hypoglycemic complications. Symptoms are stable. Pertinent negatives for diabetic complications include no CVA, heart disease, nephropathy or peripheral neuropathy. Risk factors for coronary artery disease include dyslipidemia, diabetes mellitus, obesity, post-menopausal, sedentary lifestyle and family history. She is following a diabetic diet. Her breakfast blood glucose range is generally 130-140 mg/dl. Eye exam is current.  Hypertension  This is a chronic problem. The current episode started more than 1 year ago. The problem has been resolved since onset. The problem is controlled. Associated symptoms include anxiety and malaise/fatigue. Pertinent negatives include no blurred vision, peripheral edema or shortness of breath. Risk factors for coronary artery disease include diabetes mellitus, dyslipidemia, family history,  obesity, post-menopausal state and sedentary lifestyle. The current treatment provides moderate improvement. There is no history of CVA. Identifiable causes of hypertension include a thyroid problem.  Thyroid Problem  Presents for follow-up visit. Symptoms include anxiety and fatigue. Patient reports no constipation, diaphoresis, diarrhea or visual change. The symptoms have been stable. Her past medical history is significant for hyperlipidemia.  Hyperlipidemia  This is a chronic problem. The current episode started more than 1 year ago. The problem is controlled. Recent lipid tests were reviewed and are normal. Exacerbating diseases include obesity. Pertinent negatives include no leg pain or shortness of breath. Current antihyperlipidemic treatment includes statins. The current treatment provides moderate improvement of lipids. Risk factors for coronary artery disease include diabetes mellitus, dyslipidemia, family history, obesity, hypertension and a sedentary lifestyle.  Anxiety  Presents for follow-up visit. Symptoms include excessive worry and nervous/anxious behavior. Patient reports no shortness of breath. Symptoms occur occasionally.    Atherosclerosis of Aorta  PT taking Lipitor 20 mg.     Review of Systems  Constitutional: Positive for fatigue and malaise/fatigue. Negative for diaphoresis.  Eyes: Negative for blurred vision.  Respiratory: Negative for shortness of breath.   Gastrointestinal: Negative for bowel incontinence, constipation and diarrhea.  Genitourinary: Negative for bladder incontinence and dysuria.  Musculoskeletal: Positive for back pain.  Psychiatric/Behavioral: The patient is nervous/anxious.        Objective:   Physical Exam  Constitutional: She is oriented to person, place, and time. She appears well-developed and well-nourished. No distress.  HENT:  Head: Normocephalic and atraumatic.  Right Ear: External ear normal.  Left Ear: External ear normal.  Nose:  Nose normal.  Mouth/Throat: Oropharynx is clear and moist.  Eyes: Pupils are equal, round, and reactive to light.  Neck: Normal range  of motion. Neck supple. No thyromegaly present.  Cardiovascular: Normal rate, regular rhythm, normal heart sounds and intact distal pulses.   No murmur heard. Pulmonary/Chest: Effort normal and breath sounds normal. No respiratory distress. She has no wheezes.  Abdominal: Soft. Bowel sounds are normal. She exhibits no distension. There is no tenderness.  Musculoskeletal: Normal range of motion. She exhibits edema. She exhibits no tenderness (trace in BLE ankles).  Neurological: She is alert and oriented to person, place, and time.  Skin: Skin is warm and dry.  Psychiatric: She has a normal mood and affect. Her behavior is normal. Judgment and thought content normal.  Vitals reviewed.     BP 132/72   Pulse 75   Temp 98.8 F (37.1 C) (Oral)   Ht 5' 8"  (1.727 m)   Wt 218 lb 3.2 oz (99 kg)   BMI 33.18 kg/m      Assessment & Plan:  1. Essential hypertension - CMP14+EGFR  2. Hypothyroidism, unspecified type - CMP14+EGFR - TSH  3. Atherosclerosis of aorta (HCC) - CMP14+EGFR - Lipid panel  4. Type 2 diabetes mellitus without complication, without long-term current use of insulin (HCC) - Bayer DCA Hb A1c Waived - CMP14+EGFR  5. Vitamin D deficiency - CMP14+EGFR  6. Obesity (BMI 30-39.9)  - CMP14+EGFR  7. Hyperlipidemia, unspecified hyperlipidemia type - CMP14+EGFR - Lipid panel  8. Chronic midline thoracic back pain - cyclobenzaprine (FLEXERIL) 10 MG tablet; TAKE 1 TABLET 3 TIMES A DAY AS NEEDED FOR MUSCLE SPASM  Dispense: 90 tablet; Refill: 1 - CMP14+EGFR  9. Anxiety and depression  - CMP14+EGFR   Continue all meds Labs pending Health Maintenance reviewed Diet and exercise encouraged RTO 6 months   Evelina Dun, FNP

## 2017-05-13 LAB — CMP14+EGFR
ALT: 6 IU/L (ref 0–32)
AST: 10 IU/L (ref 0–40)
Albumin/Globulin Ratio: 2.1 (ref 1.2–2.2)
Albumin: 4 g/dL (ref 3.5–5.5)
Alkaline Phosphatase: 110 IU/L (ref 39–117)
BUN/Creatinine Ratio: 10 (ref 9–23)
BUN: 7 mg/dL (ref 6–24)
Bilirubin Total: <0.2 mg/dL (ref 0.0–1.2)
CO2: 24 mmol/L (ref 20–29)
Calcium: 9.4 mg/dL (ref 8.7–10.2)
Chloride: 101 mmol/L (ref 96–106)
Creatinine, Ser: 0.67 mg/dL (ref 0.57–1.00)
GFR calc Af Amer: 113 mL/min/{1.73_m2} (ref >59)
GFR calc non Af Amer: 98 mL/min/{1.73_m2} (ref >59)
Globulin, Total: 1.9 g/dL (ref 1.5–4.5)
Glucose: 90 mg/dL (ref 65–99)
Potassium: 4 mmol/L (ref 3.5–5.2)
Sodium: 139 mmol/L (ref 134–144)
Total Protein: 5.9 g/dL — ABNORMAL LOW (ref 6.0–8.5)

## 2017-05-13 LAB — LIPID PANEL
Chol/HDL Ratio: 2.7 ratio (ref 0.0–4.4)
Cholesterol, Total: 129 mg/dL (ref 100–199)
HDL: 47 mg/dL (ref >39)
LDL Calculated: 56 mg/dL (ref 0–99)
Triglycerides: 130 mg/dL (ref 0–149)
VLDL Cholesterol Cal: 26 mg/dL (ref 5–40)

## 2017-05-13 LAB — TSH: TSH: 2.48 u[IU]/mL (ref 0.450–4.500)

## 2017-05-21 DIAGNOSIS — G4733 Obstructive sleep apnea (adult) (pediatric): Secondary | ICD-10-CM | POA: Diagnosis not present

## 2017-05-28 MED FILL — metFORMIN HCL 1000 MG TABS: 1000 | 90 days supply | Qty: 180 | Fill #1

## 2017-06-07 DIAGNOSIS — J069 Acute upper respiratory infection, unspecified: Secondary | ICD-10-CM | POA: Diagnosis not present

## 2017-06-17 ENCOUNTER — Telehealth: Payer: 59 | Admitting: Family

## 2017-06-17 DIAGNOSIS — R05 Cough: Secondary | ICD-10-CM

## 2017-06-17 DIAGNOSIS — R059 Cough, unspecified: Secondary | ICD-10-CM

## 2017-06-17 MED ORDER — PREDNISONE 10 MG (21) PO TBPK: ORAL_TABLET | ORAL | 0 refills | Status: DC

## 2017-06-17 MED ORDER — DOXYCYCLINE HYCLATE 100 MG PO TABS: 100.0000 mg | ORAL_TABLET | Freq: Two times a day (BID) | ORAL | 0 refills | Status: DC

## 2017-06-17 MED ORDER — BENZONATATE 100 MG PO CAPS: 100.0000 mg | ORAL_CAPSULE | Freq: Three times a day (TID) | ORAL | 0 refills | Status: DC | PRN

## 2017-06-17 NOTE — Progress Notes (Signed)
See previous Evisit 

## 2017-06-17 NOTE — Progress Notes (Signed)
We are sorry that you are not feeling well.  Here is how we plan to help!  Based on your presentation I believe you most likely have A cough due to bacteria.  When patients have a fever and a productive cough with a change in color or increased sputum production, we are concerned about bacterial bronchitis.  If left untreated it can progress to pneumonia.  If your symptoms do not improve with your treatment plan it is important that you contact your provider.   I have prescribed Doxycycline 100 mg twice a day for 7 days     In addition you may use A non-prescription cough medication called Robitussin DAC. Take 2 teaspoons every 8 hours or Delsym: take 2 teaspoons every 12 hours., A non-prescription cough medication called Mucinex DM: take 2 tablets every 12 hours. and A prescription cough medication called Tessalon Perles 100mg. You may take 1-2 capsules every 8 hours as needed for your cough.  Sterapred 10 mg dosepak  From your responses in the eVisit questionnaire you describe inflammation in the upper respiratory tract which is causing a significant cough.  This is commonly called Bronchitis and has four common causes:    Allergies  Viral Infections  Acid Reflux  Bacterial Infection Allergies, viruses and acid reflux are treated by controlling symptoms or eliminating the cause. An example might be a cough caused by taking certain blood pressure medications. You stop the cough by changing the medication. Another example might be a cough caused by acid reflux. Controlling the reflux helps control the cough.  USE OF BRONCHODILATOR ("RESCUE") INHALERS: There is a risk from using your bronchodilator too frequently.  The risk is that over-reliance on a medication which only relaxes the muscles surrounding the breathing tubes can reduce the effectiveness of medications prescribed to reduce swelling and congestion of the tubes themselves.  Although you feel brief relief from the bronchodilator  inhaler, your asthma may actually be worsening with the tubes becoming more swollen and filled with mucus.  This can delay other crucial treatments, such as oral steroid medications. If you need to use a bronchodilator inhaler daily, several times per day, you should discuss this with your provider.  There are probably better treatments that could be used to keep your asthma under control.     HOME CARE . Only take medications as instructed by your medical team. . Complete the entire course of an antibiotic. . Drink plenty of fluids and get plenty of rest. . Avoid close contacts especially the very young and the elderly . Cover your mouth if you cough or cough into your sleeve. . Always remember to wash your hands . A steam or ultrasonic humidifier can help congestion.   GET HELP RIGHT AWAY IF: . You develop worsening fever. . You become short of breath . You cough up blood. . Your symptoms persist after you have completed your treatment plan MAKE SURE YOU   Understand these instructions.  Will watch your condition.  Will get help right away if you are not doing well or get worse.  Your e-visit answers were reviewed by a board certified advanced clinical practitioner to complete your personal care plan.  Depending on the condition, your plan could have included both over the counter or prescription medications. If there is a problem please reply  once you have received a response from your provider. Your safety is important to us.  If you have drug allergies check your prescription carefully.      You can use MyChart to ask questions about today's visit, request a non-urgent call back, or ask for a work or school excuse for 24 hours related to this e-Visit. If it has been greater than 24 hours you will need to follow up with your provider, or enter a new e-Visit to address those concerns. You will get an e-mail in the next two days asking about your experience.  I hope that your e-visit  has been valuable and will speed your recovery. Thank you for using e-visits.   

## 2017-06-30 MED FILL — CYCLOBENZAPRINE 10 MG TAB: 10 | 30 days supply | Qty: 90 | Fill #1

## 2017-06-30 MED FILL — LEVOTHYROXINE 50 MCG TABLET: 50 | 90 days supply | Qty: 90 | Fill #2

## 2017-07-13 MED FILL — ATORVASTATIN 20 MG TABLET: 20 | 90 days supply | Qty: 90 | Fill #0

## 2017-07-13 MED FILL — DULoxetine HCL 30 MG CPEP: 30 | 90 days supply | Qty: 180 | Fill #0

## 2017-07-29 MED FILL — VIT D2 1.25 MG (50,000 UNIT: 1.25 MG | 84 days supply | Qty: 12 | Fill #3

## 2017-07-29 MED FILL — ESCITALOPRAM 20 MG TABLET: 20 | 90 days supply | Qty: 90 | Fill #3

## 2017-07-29 MED FILL — LOSARTAN POTASSIUM 50 MG TA: 50 | 90 days supply | Qty: 90 | Fill #0

## 2017-08-17 MED FILL — CYCLOBENZAPRINE 10 MG TAB: 10 | 30 days supply | Qty: 90 | Fill #0

## 2017-08-17 MED FILL — metFORMIN HCL 1000 MG TABS: 1000 | 90 days supply | Qty: 180 | Fill #0

## 2017-09-14 ENCOUNTER — Other Ambulatory Visit: Payer: Self-pay | Admitting: Family

## 2017-09-14 DIAGNOSIS — F329 Major depressive disorder, single episode, unspecified: Secondary | ICD-10-CM

## 2017-09-14 DIAGNOSIS — M546 Pain in thoracic spine: Secondary | ICD-10-CM

## 2017-09-14 DIAGNOSIS — F419 Anxiety disorder, unspecified: Secondary | ICD-10-CM

## 2017-09-14 DIAGNOSIS — F32A Depression, unspecified: Secondary | ICD-10-CM

## 2017-09-14 MED FILL — LEVOTHYROXINE 50 MCG TABLET: 50 | 90 days supply | Qty: 90 | Fill #3

## 2017-10-06 ENCOUNTER — Other Ambulatory Visit: Payer: Self-pay | Admitting: Family

## 2017-10-06 DIAGNOSIS — E785 Hyperlipidemia, unspecified: Secondary | ICD-10-CM

## 2017-10-06 DIAGNOSIS — E7800 Pure hypercholesterolemia, unspecified: Secondary | ICD-10-CM

## 2017-10-06 MED FILL — ATORVASTATIN 20 MG TABLET: 20 | 90 days supply | Qty: 90 | Fill #0

## 2017-10-06 MED FILL — CYCLOBENZAPRINE 10 MG TAB: 10 | 30 days supply | Qty: 90 | Fill #1

## 2017-10-06 MED FILL — DULoxetine HCL 30 MG CPEP: 30 | 90 days supply | Qty: 180 | Fill #0

## 2017-10-06 MED FILL — VIT D2 1.25 MG (50,000 UNIT: 1.25 MG | 84 days supply | Qty: 12 | Fill #0

## 2017-10-06 NOTE — Telephone Encounter (Signed)
Next OV 11/19/17 

## 2017-10-28 MED FILL — ESCITALOPRAM 20 MG TABLET: 20 | 90 days supply | Qty: 90 | Fill #0

## 2017-11-11 MED FILL — LOSARTAN POTASSIUM 50 MG TA: 50 | 90 days supply | Qty: 90 | Fill #1

## 2017-11-19 ENCOUNTER — Encounter: Payer: Self-pay | Admitting: Family

## 2017-11-19 ENCOUNTER — Ambulatory Visit (INDEPENDENT_AMBULATORY_CARE_PROVIDER_SITE_OTHER): Payer: 59 | Admitting: Family

## 2017-11-19 VITALS — BP 114/66 | HR 68 | Temp 97.1°F | Ht 68.0 in | Wt 215.2 lb

## 2017-11-19 DIAGNOSIS — I10 Essential (primary) hypertension: Secondary | ICD-10-CM

## 2017-11-19 DIAGNOSIS — G4733 Obstructive sleep apnea (adult) (pediatric): Secondary | ICD-10-CM | POA: Diagnosis not present

## 2017-11-19 DIAGNOSIS — E559 Vitamin D deficiency, unspecified: Secondary | ICD-10-CM | POA: Diagnosis not present

## 2017-11-19 DIAGNOSIS — E669 Obesity, unspecified: Secondary | ICD-10-CM | POA: Diagnosis not present

## 2017-11-19 DIAGNOSIS — F419 Anxiety disorder, unspecified: Secondary | ICD-10-CM | POA: Diagnosis not present

## 2017-11-19 DIAGNOSIS — E119 Type 2 diabetes mellitus without complications: Secondary | ICD-10-CM | POA: Diagnosis not present

## 2017-11-19 DIAGNOSIS — G8929 Other chronic pain: Secondary | ICD-10-CM | POA: Diagnosis not present

## 2017-11-19 DIAGNOSIS — F32A Depression, unspecified: Secondary | ICD-10-CM

## 2017-11-19 DIAGNOSIS — E039 Hypothyroidism, unspecified: Secondary | ICD-10-CM | POA: Diagnosis not present

## 2017-11-19 DIAGNOSIS — H21561 Pupillary abnormality, right eye: Secondary | ICD-10-CM

## 2017-11-19 DIAGNOSIS — I700 Atherosclerosis of aorta: Secondary | ICD-10-CM

## 2017-11-19 DIAGNOSIS — M546 Pain in thoracic spine: Secondary | ICD-10-CM

## 2017-11-19 DIAGNOSIS — F329 Major depressive disorder, single episode, unspecified: Secondary | ICD-10-CM

## 2017-11-19 DIAGNOSIS — E785 Hyperlipidemia, unspecified: Secondary | ICD-10-CM | POA: Diagnosis not present

## 2017-11-19 LAB — BAYER DCA HB A1C WAIVED: HB A1C (BAYER DCA - WAIVED): 6.8 % (ref <7.0)
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
## 2017-11-19 NOTE — Patient Instructions (Signed)
Stress and Stress Management Stress is a normal reaction to life events. It is what you feel when life demands more than you are used to or more than you can handle. Some stress can be useful. For example, the stress reaction can help you catch the last bus of the day, study for a test, or meet a deadline at work. But stress that occurs too often or for too long can cause problems. It can affect your emotional health and interfere with relationships and normal daily activities. Too much stress can weaken your immune system and increase your risk for physical illness. If you already have a medical problem, stress can make it worse. What are the causes? All sorts of life events may cause stress. An event that causes stress for one person may not be stressful for another person. Major life events commonly cause stress. These may be positive or negative. Examples include losing your job, moving into a new home, getting married, having a baby, or losing a loved one. Less obvious life events may also cause stress, especially if they occur day after day or in combination. Examples include working long hours, driving in traffic, caring for children, being in debt, or being in a difficult relationship. What are the signs or symptoms? Stress may cause emotional symptoms including, the following:  Anxiety. This is feeling worried, afraid, on edge, overwhelmed, or out of control.  Anger. This is feeling irritated or impatient.  Depression. This is feeling sad, down, helpless, or guilty.  Difficulty focusing, remembering, or making decisions.  Stress may cause physical symptoms, including the following:  Aches and pains. These may affect your head, neck, back, stomach, or other areas of your body.  Tight muscles or clenched jaw.  Low energy or trouble sleeping.  Stress may cause unhealthy behaviors, including the following:  Eating to feel better (overeating) or skipping meals.  Sleeping too little,  too much, or both.  Working too much or putting off tasks (procrastination).  Smoking, drinking alcohol, or using drugs to feel better.  How is this diagnosed? Stress is diagnosed through an assessment by your health care provider. Your health care provider will ask questions about your symptoms and any stressful life events.Your health care provider will also ask about your medical history and may order blood tests or other tests. Certain medical conditions and medicine can cause physical symptoms similar to stress. Mental illness can cause emotional symptoms and unhealthy behaviors similar to stress. Your health care provider may refer you to a mental health professional for further evaluation. How is this treated? Stress management is the recommended treatment for stress.The goals of stress management are reducing stressful life events and coping with stress in healthy ways. Techniques for reducing stressful life events include the following:  Stress identification. Self-monitor for stress and identify what causes stress for you. These skills may help you to avoid some stressful events.  Time management. Set your priorities, keep a calendar of events, and learn to say "no." These tools can help you avoid making too many commitments.  Techniques for coping with stress include the following:  Rethinking the problem. Try to think realistically about stressful events rather than ignoring them or overreacting. Try to find the positives in a stressful situation rather than focusing on the negatives.  Exercise. Physical exercise can release both physical and emotional tension. The key is to find a form of exercise you enjoy and do it regularly.  Relaxation techniques. These relax the body and   mind. Examples include yoga, meditation, tai chi, biofeedback, deep breathing, progressive muscle relaxation, listening to music, being out in nature, journaling, and other hobbies. Again, the key is to find  one or more that you enjoy and can do regularly.  Healthy lifestyle. Eat a balanced diet, get plenty of sleep, and do not smoke. Avoid using alcohol or drugs to relax.  Strong support network. Spend time with family, friends, or other people you enjoy being around.Express your feelings and talk things over with someone you trust.  Counseling or talktherapy with a mental health professional may be helpful if you are having difficulty managing stress on your own. Medicine is typically not recommended for the treatment of stress.Talk to your health care provider if you think you need medicine for symptoms of stress. Follow these instructions at home:  Keep all follow-up visits as directed by your health care provider.  Take all medicines as directed by your health care provider. Contact a health care provider if:  Your symptoms get worse or you start having new symptoms.  You feel overwhelmed by your problems and can no longer manage them on your own. Get help right away if:  You feel like hurting yourself or someone else. This information is not intended to replace advice given to you by your health care provider. Make sure you discuss any questions you have with your health care provider. Document Released: 02/11/2001 Document Revised: 01/24/2016 Document Reviewed: 04/12/2013 [employer] Interactive Patient Education  2017 [employer]  

## 2017-11-20 LAB — CMP14+EGFR
ALT: 11 IU/L (ref 0–32)
AST: 13 IU/L (ref 0–40)
Albumin/Globulin Ratio: 1.7 (ref 1.2–2.2)
Albumin: 4.2 g/dL (ref 3.5–5.5)
Alkaline Phosphatase: 120 IU/L — ABNORMAL HIGH (ref 39–117)
BUN/Creatinine Ratio: 21 (ref 9–23)
BUN: 14 mg/dL (ref 6–24)
Bilirubin Total: <0.2 mg/dL (ref 0.0–1.2)
CO2: 23 mmol/L (ref 20–29)
Calcium: 9.7 mg/dL (ref 8.7–10.2)
Chloride: 100 mmol/L (ref 96–106)
Creatinine, Ser: 0.66 mg/dL (ref 0.57–1.00)
GFR calc Af Amer: 113 mL/min/{1.73_m2} (ref >59)
GFR calc non Af Amer: 98 mL/min/{1.73_m2} (ref >59)
Globulin, Total: 2.5 g/dL (ref 1.5–4.5)
Glucose: 89 mg/dL (ref 65–99)
Potassium: 4.7 mmol/L (ref 3.5–5.2)
Sodium: 140 mmol/L (ref 134–144)
Total Protein: 6.7 g/dL (ref 6.0–8.5)

## 2017-11-20 LAB — LIPID PANEL
Chol/HDL Ratio: 3.1 ratio (ref 0.0–4.4)
Cholesterol, Total: 155 mg/dL (ref 100–199)
HDL: 50 mg/dL (ref >39)
LDL Calculated: 73 mg/dL (ref 0–99)
Triglycerides: 162 mg/dL — ABNORMAL HIGH (ref 0–149)
VLDL Cholesterol Cal: 32 mg/dL (ref 5–40)

## 2017-11-20 LAB — MICROALBUMIN / CREATININE URINE RATIO
Creatinine, Urine: 9 mg/dL
Microalbumin, Urine: <3 ug/mL

## 2017-11-23 ENCOUNTER — Other Ambulatory Visit: Payer: Self-pay | Admitting: Family

## 2017-11-23 DIAGNOSIS — G8929 Other chronic pain: Secondary | ICD-10-CM

## 2017-11-23 DIAGNOSIS — M546 Pain in thoracic spine: Principal | ICD-10-CM

## 2017-11-23 MED FILL — metFORMIN HCL 1000 MG TABS: 1000 | 90 days supply | Qty: 180 | Fill #1

## 2017-11-23 MED FILL — CYCLOBENZAPRINE 10 MG TAB: 10 | 30 days supply | Qty: 90 | Fill #0

## 2017-11-26 ENCOUNTER — Encounter: Payer: Self-pay | Admitting: Family

## 2017-11-26 ENCOUNTER — Other Ambulatory Visit: Payer: Self-pay | Admitting: Family

## 2017-11-26 DIAGNOSIS — H5702 Anisocoria: Secondary | ICD-10-CM

## 2017-12-06 LAB — HM DIABETES EYE EXAM

## 2017-12-07 DIAGNOSIS — H5203 Hypermetropia, bilateral: Secondary | ICD-10-CM | POA: Diagnosis not present

## 2017-12-08 ENCOUNTER — Ambulatory Visit (HOSPITAL_COMMUNITY): Payer: 59

## 2017-12-10 ENCOUNTER — Ambulatory Visit (HOSPITAL_COMMUNITY)
Admission: RE | Admit: 2017-12-10 | Discharge: 2017-12-10 | Disposition: A | Discharge status: Home / Self Care | Payer: 59 | Source: Ambulatory Visit | Attending: Family | Admitting: Family

## 2017-12-10 DIAGNOSIS — H5702 Anisocoria: Secondary | ICD-10-CM | POA: Insufficient documentation

## 2017-12-10 DIAGNOSIS — J338 Other polyp of sinus: Secondary | ICD-10-CM | POA: Insufficient documentation

## 2017-12-11 ENCOUNTER — Ambulatory Visit (HOSPITAL_COMMUNITY)
Admission: RE | Admit: 2017-12-11 | Discharge: 2017-12-11 | Disposition: A | Discharge status: Home / Self Care | Payer: 59 | Source: Ambulatory Visit | Attending: Family | Admitting: Family

## 2017-12-11 DIAGNOSIS — J338 Other polyp of sinus: Secondary | ICD-10-CM | POA: Diagnosis not present

## 2017-12-11 DIAGNOSIS — H5702 Anisocoria: Secondary | ICD-10-CM

## 2017-12-14 MED FILL — LEVOTHYROXINE 50 MCG TABLET: 50 | 90 days supply | Qty: 90 | Fill #0

## 2017-12-30 MED FILL — VIT D2 1.25 MG (50,000 UNIT: 1.25 MG | 84 days supply | Qty: 12 | Fill #1

## 2018-01-05 ENCOUNTER — Encounter: Payer: Self-pay | Admitting: Family

## 2018-01-05 DIAGNOSIS — Z1231 Encounter for screening mammogram for malignant neoplasm of breast: Secondary | ICD-10-CM | POA: Diagnosis not present

## 2018-01-05 DIAGNOSIS — Z6834 Body mass index (BMI) 34.0-34.9, adult: Secondary | ICD-10-CM | POA: Diagnosis not present

## 2018-01-05 DIAGNOSIS — Z01419 Encounter for gynecological examination (general) (routine) without abnormal findings: Secondary | ICD-10-CM | POA: Diagnosis not present

## 2018-01-11 ENCOUNTER — Other Ambulatory Visit: Payer: Self-pay | Admitting: Family

## 2018-01-11 DIAGNOSIS — F419 Anxiety disorder, unspecified: Secondary | ICD-10-CM

## 2018-01-11 DIAGNOSIS — F329 Major depressive disorder, single episode, unspecified: Secondary | ICD-10-CM

## 2018-01-11 DIAGNOSIS — F32A Depression, unspecified: Secondary | ICD-10-CM

## 2018-01-11 DIAGNOSIS — M546 Pain in thoracic spine: Secondary | ICD-10-CM

## 2018-01-11 MED FILL — CYCLOBENZAPRINE 10 MG TAB: 10 | 30 days supply | Qty: 90 | Fill #1

## 2018-01-11 MED FILL — DULoxetine HCL 30 MG CPEP: 30 | 90 days supply | Qty: 180 | Fill #0

## 2018-01-26 ENCOUNTER — Other Ambulatory Visit: Payer: Self-pay | Admitting: Family

## 2018-01-26 DIAGNOSIS — E785 Hyperlipidemia, unspecified: Secondary | ICD-10-CM

## 2018-01-26 DIAGNOSIS — I10 Essential (primary) hypertension: Secondary | ICD-10-CM

## 2018-01-26 DIAGNOSIS — E7800 Pure hypercholesterolemia, unspecified: Secondary | ICD-10-CM

## 2018-01-26 MED FILL — ESCITALOPRAM 20 MG TABLET: 20 | 90 days supply | Qty: 90 | Fill #1

## 2018-01-26 MED FILL — LOSARTAN POTASSIUM 50 MG TA: 50 | 90 days supply | Qty: 90 | Fill #0

## 2018-01-26 MED FILL — ATORVASTATIN 20 MG TABLET: 20 | 90 days supply | Qty: 90 | Fill #0

## 2018-02-11 ENCOUNTER — Other Ambulatory Visit: Payer: Self-pay | Admitting: Family

## 2018-02-11 DIAGNOSIS — E119 Type 2 diabetes mellitus without complications: Secondary | ICD-10-CM

## 2018-02-11 MED FILL — metFORMIN HCL 1000 MG TABS: 1000 | 90 days supply | Qty: 180 | Fill #0

## 2018-03-08 ENCOUNTER — Encounter: Payer: Self-pay | Admitting: Family

## 2018-03-08 ENCOUNTER — Ambulatory Visit: Payer: 59 | Admitting: Pulmonary Disease

## 2018-03-08 ENCOUNTER — Other Ambulatory Visit: Payer: Self-pay | Admitting: Family

## 2018-03-08 ENCOUNTER — Encounter: Payer: Self-pay | Admitting: Pulmonary Disease

## 2018-03-08 VITALS — BP 132/76 | HR 85 | Ht 68.0 in | Wt 218.4 lb

## 2018-03-08 DIAGNOSIS — G8929 Other chronic pain: Secondary | ICD-10-CM

## 2018-03-08 DIAGNOSIS — M546 Pain in thoracic spine: Principal | ICD-10-CM

## 2018-03-08 DIAGNOSIS — G4733 Obstructive sleep apnea (adult) (pediatric): Secondary | ICD-10-CM | POA: Diagnosis not present

## 2018-03-08 DIAGNOSIS — Z9989 Dependence on other enabling machines and devices: Secondary | ICD-10-CM | POA: Diagnosis not present

## 2018-03-08 NOTE — Patient Instructions (Signed)
Follow up in 1 year.

## 2018-03-08 NOTE — Progress Notes (Signed)
[facility]  Chief Complaint  Patient presents with  . Follow-up    f/u CPAP, no complications, pt states doing well on CPAP    Constitutional: BP 132/76 (BP Location: Left Arm, Cuff Size: Normal)   Pulse 85   Ht 5\' 8"  (1.727 m)   Wt 218 lb 6.4 oz (99.1 kg)   SpO2 96%   BMI 33.21 kg/m   History of Present Illness: Brenda Meyer is a 57 y.o. female with obstructive sleep apnea.  She is doing well with CPAP.  Uses full face mask.  Sleeps through the night.  No issue with mask fit or pressure.  Not having sinus congestion, sore throat, leg swelling.   Comprehensive Respiratory Exam:  Appearance - well kempt  ENMT - nasal mucosa moist, turbinates clear, midline nasal septum, no dental lesions, no gingival bleeding, no oral exudates, no tonsillar hypertrophy Neck - no masses, trachea midline, no thyromegaly, no elevation in JVP Respiratory - normal appearance of chest wall, normal respiratory effort w/o accessory muscle use, no wheezing or rales CV - s1s2 regular rate and rhythm, no murmurs, no peripheral edema GI - soft, non tender Lymph - no adenopathy noted in neck and axillary areas MSK - normal muscle strength and tone, normal gait Ext - no cyanosis, clubbing, or joint inflammation noted Skin - no rashes, lesions, or ulcers Neuro - oriented to person, place, and time Psych - normal mood and affect  Assessment/Plan:  Obstructive sleep apnea. - she is compliant with CPAP and reports benefit from therapy - continue auto CPAP - reviewed her download with her - discussed different mask options   Patient Instructions  Follow up in 1 year    Coralyn Helling, MD [facility] 03/08/2018, 10:50 AM  Flow Sheet  Sleep tests: PSG 2012 >> AHI 29 Auto CPAP 02/06/18 to 03/07/18 >> used on 30 of 30 nights with average 8 hrs 44 min.  Average AHI 2 with median CPAP 11 and 95 th percentile CPAP 14 cm H2O  Past Medical  History: She  has a past medical history of Diabetes mellitus (HCC), Hyperlipidemia, Hypertension, Hypothyroidism, and OSA (obstructive sleep apnea).  Past Surgical History: She  has a past surgical history that includes Shoulder surgery and Colonoscopy (11/17/2011).  Family History: Her family history includes Brain cancer in her brother; Heart disease in her father and mother; Lung cancer in her sister.  Social History: She  reports that she quit smoking about 7 years ago. Her smoking use included cigarettes. She has a 30.00 pack-year smoking history. She has never used smokeless tobacco. She reports that she does not drink alcohol.  Medications: Allergies as of 03/08/2018      Reactions   Ace Inhibitors Swelling      Medication List        Accurate as of 03/08/18 10:50 AM. Always use your most recent med list.          aspirin EC 81 MG tablet Take 1 tablet (81 mg total) by mouth daily.   atorvastatin 20 MG tablet Commonly known as:  LIPITOR TAKE 1 TABLET BY MOUTH DAILY.   CALCIUM + D PO Take by mouth.   cyclobenzaprine 10 MG tablet Commonly known as:  FLEXERIL TAKE 1 TABLET BY MOUTH 3 TIMES A DAY AS NEEDED FOR MUSCLE SPASM   diphenhydrAMINE 25 MG tablet Commonly known as:  BENADRYL Take 25 mg by mouth at bedtime as needed for sleep.  DULoxetine 30 MG capsule Commonly known as:  CYMBALTA TAKE 2 CAPSULES BY MOUTH DAILY.   escitalopram 20 MG tablet Commonly known as:  LEXAPRO Take 1 tablet (20 mg total) by mouth daily.   levothyroxine 50 MCG tablet Commonly known as:  SYNTHROID, LEVOTHROID Take 1 tablet (50 mcg total) by mouth daily.   losartan 50 MG tablet Commonly known as:  COZAAR TAKE 1 TABLET (50 MG TOTAL) BY MOUTH DAILY.   metFORMIN 1000 MG tablet Commonly known as:  GLUCOPHAGE TAKE 1 TABLET (1,000 MG TOTAL) BY MOUTH 2 (TWO) TIMES DAILY WITH A MEAL.   Vitamin D (Ergocalciferol) 50000 units Caps capsule Commonly known as:  DRISDOL Take 1 capsule  (50,000 Units total) by mouth every 7 (seven) days.

## 2018-03-09 MED FILL — CYCLOBENZAPRINE 10 MG TAB: 10 | 30 days supply | Qty: 90 | Fill #0

## 2018-03-15 MED FILL — VIT D2 1.25 MG (50,000 UNIT: 1.25 MG | 84 days supply | Qty: 12 | Fill #2

## 2018-03-15 MED FILL — LEVOTHYROXINE 50 MCG TABLET: 50 | 90 days supply | Qty: 90 | Fill #1

## 2018-04-12 ENCOUNTER — Other Ambulatory Visit: Payer: Self-pay | Admitting: Family

## 2018-04-12 DIAGNOSIS — M546 Pain in thoracic spine: Secondary | ICD-10-CM

## 2018-04-12 DIAGNOSIS — F32A Depression, unspecified: Secondary | ICD-10-CM

## 2018-04-12 DIAGNOSIS — E785 Hyperlipidemia, unspecified: Secondary | ICD-10-CM

## 2018-04-12 DIAGNOSIS — F329 Major depressive disorder, single episode, unspecified: Secondary | ICD-10-CM

## 2018-04-12 DIAGNOSIS — F419 Anxiety disorder, unspecified: Secondary | ICD-10-CM

## 2018-04-12 DIAGNOSIS — E7800 Pure hypercholesterolemia, unspecified: Secondary | ICD-10-CM

## 2018-04-12 MED FILL — DULoxetine HCL 30 MG CPEP: 30 | 90 days supply | Qty: 180 | Fill #0

## 2018-04-12 MED FILL — ATORVASTATIN CALCIUM 20 MG: 20 | 90 days supply | Qty: 90 | Fill #0

## 2018-04-12 NOTE — Telephone Encounter (Signed)
Last seen 11/19/17

## 2018-04-27 ENCOUNTER — Other Ambulatory Visit: Payer: Self-pay | Admitting: Family

## 2018-04-27 DIAGNOSIS — I10 Essential (primary) hypertension: Secondary | ICD-10-CM

## 2018-04-27 MED FILL — CYCLOBENZAPRINE 10 MG TAB: 10 | 30 days supply | Qty: 90 | Fill #1

## 2018-04-27 MED FILL — LOSARTAN POTASSIUM 50 MG TA: 50 | 90 days supply | Qty: 90 | Fill #0

## 2018-04-27 MED FILL — ESCITALOPRAM 20 MG TABLET: 20 | 90 days supply | Qty: 90 | Fill #2

## 2018-04-27 NOTE — Telephone Encounter (Signed)
OV 05/21/18 

## 2018-05-19 MED FILL — metFORMIN HCL 1000 MG TABS: 1000 | 90 days supply | Qty: 180 | Fill #1

## 2018-05-21 ENCOUNTER — Ambulatory Visit (INDEPENDENT_AMBULATORY_CARE_PROVIDER_SITE_OTHER): Payer: 59 | Admitting: Family

## 2018-05-21 ENCOUNTER — Encounter: Payer: Self-pay | Admitting: Family

## 2018-05-21 VITALS — BP 147/70 | HR 69 | Temp 97.6°F | Ht 68.0 in | Wt 219.8 lb

## 2018-05-21 DIAGNOSIS — I10 Essential (primary) hypertension: Secondary | ICD-10-CM

## 2018-05-21 DIAGNOSIS — F419 Anxiety disorder, unspecified: Secondary | ICD-10-CM | POA: Diagnosis not present

## 2018-05-21 DIAGNOSIS — G4733 Obstructive sleep apnea (adult) (pediatric): Secondary | ICD-10-CM | POA: Diagnosis not present

## 2018-05-21 DIAGNOSIS — E039 Hypothyroidism, unspecified: Secondary | ICD-10-CM | POA: Diagnosis not present

## 2018-05-21 DIAGNOSIS — F329 Major depressive disorder, single episode, unspecified: Secondary | ICD-10-CM | POA: Diagnosis not present

## 2018-05-21 DIAGNOSIS — Z0289 Encounter for other administrative examinations: Secondary | ICD-10-CM

## 2018-05-21 DIAGNOSIS — F32A Depression, unspecified: Secondary | ICD-10-CM

## 2018-05-21 DIAGNOSIS — E785 Hyperlipidemia, unspecified: Secondary | ICD-10-CM

## 2018-05-21 DIAGNOSIS — I700 Atherosclerosis of aorta: Secondary | ICD-10-CM | POA: Diagnosis not present

## 2018-05-21 DIAGNOSIS — M546 Pain in thoracic spine: Secondary | ICD-10-CM | POA: Diagnosis not present

## 2018-05-21 DIAGNOSIS — E669 Obesity, unspecified: Secondary | ICD-10-CM | POA: Diagnosis not present

## 2018-05-21 DIAGNOSIS — G8929 Other chronic pain: Secondary | ICD-10-CM | POA: Diagnosis not present

## 2018-05-21 DIAGNOSIS — E119 Type 2 diabetes mellitus without complications: Secondary | ICD-10-CM

## 2018-05-21 LAB — BAYER DCA HB A1C WAIVED: HB A1C (BAYER DCA - WAIVED): 7 % — ABNORMAL HIGH (ref <7.0)

## 2018-05-21 NOTE — Patient Instructions (Signed)

## 2018-05-21 NOTE — Progress Notes (Signed)
Subjective:    Patient ID: Brenda Meyer, female    DOB: [birth year], 57 y.o.   MRN: [id number]  Chief Complaint  Patient presents with  . FMLA paperwork   PT presents to the office today for chronic follow up and FMLA paperwork. Diabetes  She presents for her follow-up diabetic visit. She has type 2 diabetes mellitus. Hypoglycemia symptoms include nervousness/anxiousness. Associated symptoms include fatigue. Pertinent negatives for diabetes include no blurred vision, no foot paresthesias, no visual change and no weakness. There are no hypoglycemic complications. Symptoms are stable. Pertinent negatives for diabetic complications include no CVA, heart disease or nephropathy. Risk factors for coronary artery disease include dyslipidemia, female sex, hypertension and sedentary lifestyle. Her weight is stable. She is following a generally healthy diet. Her overall blood glucose range is 110-130 mg/dl. Eye exam is current.  Hypertension  This is a chronic problem. The current episode started more than 1 year ago. The problem has been waxing and waning since onset. The problem is uncontrolled. Associated symptoms include anxiety and malaise/fatigue. Pertinent negatives include no blurred vision, peripheral edema or shortness of breath. Risk factors for coronary artery disease include dyslipidemia, family history, obesity and sedentary lifestyle. The current treatment provides moderate improvement. There is no history of CVA. Identifiable causes of hypertension include a thyroid problem.  Back Pain  This is a chronic problem. The current episode started more than 1 year ago. The problem occurs intermittently (2-3 times a month). The problem is unchanged. The pain is present in the thoracic spine. The quality of the pain is described as aching. The pain is at a severity of 7/10. The pain is moderate. Pertinent negatives include no bladder incontinence, bowel incontinence, leg pain, paresis or weakness. She  has tried muscle relaxant, home exercises and NSAIDs for the symptoms. The treatment provided moderate relief.  Thyroid Problem  Presents for follow-up visit. Symptoms include anxiety and fatigue. Patient reports no constipation, depressed mood, diaphoresis, diarrhea, heat intolerance, hoarse voice, leg swelling or visual change. The symptoms have been stable. Her past medical history is significant for hyperlipidemia.  Hyperlipidemia  This is a chronic problem. The current episode started more than 1 year ago. The problem is controlled. Recent lipid tests were reviewed and are normal. Exacerbating diseases include obesity. Pertinent negatives include no leg pain or shortness of breath. Current antihyperlipidemic treatment includes statins. The current treatment provides moderate improvement of lipids. Risk factors for coronary artery disease include dyslipidemia, female sex, hypertension and post-menopausal.  Anxiety  Presents for follow-up visit. Symptoms include excessive worry, irritability and nervous/anxious behavior. Patient reports no depressed mood or shortness of breath. Symptoms occur occasionally. The severity of symptoms is moderate. The quality of sleep is good.    OSA Sleeps with CPAP and doing well.     Review of Systems  Constitutional: Positive for fatigue, irritability and malaise/fatigue. Negative for diaphoresis.  HENT: Negative for hoarse voice.   Eyes: Negative for blurred vision.  Respiratory: Negative for shortness of breath.   Gastrointestinal: Negative for bowel incontinence, constipation and diarrhea.  Endocrine: Negative for heat intolerance.  Genitourinary: Negative for bladder incontinence.  Musculoskeletal: Positive for back pain.  Neurological: Negative for weakness.  Psychiatric/Behavioral: The patient is nervous/anxious.   All other systems reviewed and are negative.      Objective:   Physical Exam  Constitutional: She is oriented to person, place, and  time. She appears well-developed and well-nourished. No distress.  HENT:  Head: Normocephalic and atraumatic.  Right Ear: External ear normal.  Left Ear: External ear normal.  Mouth/Throat: Oropharynx is clear and moist.  Eyes: Pupils are equal, round, and reactive to light.  Neck: Normal range of motion. Neck supple. No thyromegaly present.  Cardiovascular: Normal rate, regular rhythm, normal heart sounds and intact distal pulses.  No murmur heard. Pulmonary/Chest: Effort normal and breath sounds normal. No respiratory distress. She has no wheezes.  Abdominal: Soft. Bowel sounds are normal. She exhibits no distension. There is no tenderness.  Musculoskeletal: Normal range of motion. She exhibits no edema or tenderness.  Neurological: She is alert and oriented to person, place, and time. She has normal reflexes. No cranial nerve deficit.  Skin: Skin is warm and dry.  Psychiatric: She has a normal mood and affect. Her behavior is normal. Judgment and thought content normal.  Vitals reviewed.     BP (!) 150/76   Pulse 69   Temp 97.6 F (36.4 C) (Oral)   Ht 5' 8"  (1.727 m)   Wt 219 lb 12.8 oz (99.7 kg)   BMI 33.42 kg/m      Assessment & Plan:  [name] comes in today with chief complaint of FMLA paperwork   Diagnosis and orders addressed:  1. Anxiety and depression - CMP14+EGFR - CBC with Differential/Platelet  2. Atherosclerosis of aorta (HCC) - CMP14+EGFR - Lipid panel - CBC with Differential/Platelet  3. Chronic midline thoracic back pain - CMP14+EGFR - CBC with Differential/Platelet  4. Type 2 diabetes mellitus without complication, without long-term current use of insulin (HCC) - Bayer DCA Hb A1c Waived - CMP14+EGFR - CBC with Differential/Platelet  5. Essential hypertensio - CMP14+EGFR - CBC with Differential/Platelet  6. Hyperlipidemia, unspecified hyperlipidemia type - CMP14+EGFR - Lipid panel - CBC with Differential/Platelet  7.  Hypothyroidism, unspecified type - CMP14+EGFR - CBC with Differential/Platelet - TSH  8. Obesity (BMI 30-39.9) - CMP14+EGFR - CBC with Differential/Platelet  9. OSA (obstructive sleep apnea) - CMP14+EGFR - CBC with Differential/Platelet   Labs pending Health Maintenance reviewed Diet and exercise encouraged  Follow up plan: 6 months    Evelina Dun, FNP

## 2018-05-22 LAB — CBC WITH DIFFERENTIAL/PLATELET
Basophils Absolute: 0.1 10*3/uL (ref 0.0–0.2)
Basos: 1 %
EOS (ABSOLUTE): 0.4 10*3/uL (ref 0.0–0.4)
Eos: 3 %
Hematocrit: 35.4 % (ref 34.0–46.6)
Hemoglobin: 12.1 g/dL (ref 11.1–15.9)
Immature Grans (Abs): 0.1 10*3/uL (ref 0.0–0.1)
Immature Granulocytes: 1 %
Lymphocytes Absolute: 3.1 10*3/uL (ref 0.7–3.1)
Lymphs: 29 %
MCH: 28.9 pg (ref 26.6–33.0)
MCHC: 34.2 g/dL (ref 31.5–35.7)
MCV: 85 fL (ref 79–97)
Monocytes Absolute: 0.6 10*3/uL (ref 0.1–0.9)
Monocytes: 6 %
Neutrophils Absolute: 6.5 10*3/uL (ref 1.4–7.0)
Neutrophils: 60 %
Platelets: 283 10*3/uL (ref 150–450)
RBC: 4.19 x10E6/uL (ref 3.77–5.28)
RDW: 14.1 % (ref 12.3–15.4)
WBC: 10.7 10*3/uL (ref 3.4–10.8)

## 2018-05-22 LAB — LIPID PANEL
Chol/HDL Ratio: 2.8 ratio (ref 0.0–4.4)
Cholesterol, Total: 136 mg/dL (ref 100–199)
HDL: 48 mg/dL (ref >39)
LDL Calculated: 61 mg/dL (ref 0–99)
Triglycerides: 135 mg/dL (ref 0–149)
VLDL Cholesterol Cal: 27 mg/dL (ref 5–40)

## 2018-05-22 LAB — CMP14+EGFR
ALT: 9 IU/L (ref 0–32)
AST: 8 IU/L (ref 0–40)
Albumin/Globulin Ratio: 2 (ref 1.2–2.2)
Albumin: 4.3 g/dL (ref 3.5–5.5)
Alkaline Phosphatase: 129 IU/L — ABNORMAL HIGH (ref 39–117)
BUN/Creatinine Ratio: 12 (ref 9–23)
BUN: 7 mg/dL (ref 6–24)
Bilirubin Total: 0.2 mg/dL (ref 0.0–1.2)
CO2: 24 mmol/L (ref 20–29)
Calcium: 9.4 mg/dL (ref 8.7–10.2)
Chloride: 97 mmol/L (ref 96–106)
Creatinine, Ser: 0.6 mg/dL (ref 0.57–1.00)
GFR calc Af Amer: 116 mL/min/{1.73_m2} (ref >59)
GFR calc non Af Amer: 101 mL/min/{1.73_m2} (ref >59)
Globulin, Total: 2.1 g/dL (ref 1.5–4.5)
Glucose: 112 mg/dL — ABNORMAL HIGH (ref 65–99)
Potassium: 4.2 mmol/L (ref 3.5–5.2)
Sodium: 137 mmol/L (ref 134–144)
Total Protein: 6.4 g/dL (ref 6.0–8.5)

## 2018-05-22 LAB — TSH: TSH: 2.67 u[IU]/mL (ref 0.450–4.500)

## 2018-06-16 ENCOUNTER — Other Ambulatory Visit: Payer: Self-pay | Admitting: Family

## 2018-06-16 DIAGNOSIS — E039 Hypothyroidism, unspecified: Secondary | ICD-10-CM

## 2018-06-16 DIAGNOSIS — G8929 Other chronic pain: Secondary | ICD-10-CM

## 2018-06-16 DIAGNOSIS — E559 Vitamin D deficiency, unspecified: Secondary | ICD-10-CM

## 2018-06-16 DIAGNOSIS — M546 Pain in thoracic spine: Secondary | ICD-10-CM

## 2018-06-17 MED FILL — VIT D2 1.25 MG (50,000 UNIT: 1.25 MG | 84 days supply | Qty: 12 | Fill #0

## 2018-06-17 MED FILL — CYCLOBENZAPRINE 10 MG TAB: 10 | 30 days supply | Qty: 90 | Fill #0

## 2018-06-17 MED FILL — LEVOTHYROXINE 50 MCG TABLET: 50 | 90 days supply | Qty: 90 | Fill #0

## 2018-06-17 NOTE — Telephone Encounter (Signed)
Last Vit D 07/29/16  50.2 

## 2018-07-07 ENCOUNTER — Other Ambulatory Visit: Payer: Self-pay | Admitting: Family

## 2018-07-07 DIAGNOSIS — E7800 Pure hypercholesterolemia, unspecified: Secondary | ICD-10-CM

## 2018-07-07 DIAGNOSIS — F329 Major depressive disorder, single episode, unspecified: Secondary | ICD-10-CM

## 2018-07-07 DIAGNOSIS — E785 Hyperlipidemia, unspecified: Secondary | ICD-10-CM

## 2018-07-07 DIAGNOSIS — M546 Pain in thoracic spine: Secondary | ICD-10-CM

## 2018-07-07 DIAGNOSIS — F419 Anxiety disorder, unspecified: Secondary | ICD-10-CM

## 2018-07-07 DIAGNOSIS — F32A Depression, unspecified: Secondary | ICD-10-CM

## 2018-07-07 MED FILL — ATORVASTATIN CALCIUM 20 MG: 20 | 90 days supply | Qty: 90 | Fill #0

## 2018-07-07 MED FILL — DULoxetine HCL 30 MG CPEP: 30 | 90 days supply | Qty: 180 | Fill #0

## 2018-07-07 NOTE — Telephone Encounter (Signed)
OV 05/21/18 rtc 6 mos 

## 2018-07-26 ENCOUNTER — Other Ambulatory Visit: Payer: Self-pay | Admitting: Family

## 2018-07-26 DIAGNOSIS — F419 Anxiety disorder, unspecified: Secondary | ICD-10-CM

## 2018-07-26 DIAGNOSIS — I10 Essential (primary) hypertension: Secondary | ICD-10-CM

## 2018-07-26 DIAGNOSIS — F329 Major depressive disorder, single episode, unspecified: Secondary | ICD-10-CM

## 2018-07-26 DIAGNOSIS — F32A Depression, unspecified: Secondary | ICD-10-CM

## 2018-07-26 MED FILL — ESCITALOPRAM 20 MG TABLET: 20 | 90 days supply | Qty: 90 | Fill #0

## 2018-07-26 MED FILL — LOSARTAN POTASSIUM 50 MG TA: 50 | 90 days supply | Qty: 90 | Fill #0

## 2018-08-14 ENCOUNTER — Encounter: Payer: Self-pay | Admitting: Family

## 2018-08-14 ENCOUNTER — Ambulatory Visit: Payer: 59 | Admitting: Family

## 2018-08-14 VITALS — BP 137/82 | HR 66 | Temp 97.2°F | Ht 67.0 in | Wt 219.6 lb

## 2018-08-14 DIAGNOSIS — M25562 Pain in left knee: Secondary | ICD-10-CM

## 2018-08-14 MED ORDER — DICLOFENAC SODIUM 75 MG PO TBEC: 75.0000 mg | DELAYED_RELEASE_TABLET | Freq: Two times a day (BID) | ORAL | 0 refills | Status: DC

## 2018-08-14 MED ORDER — KETOROLAC TROMETHAMINE 60 MG/2ML IM SOLN
60.0000 mg | Freq: Once | INTRAMUSCULAR | Status: AC
  Administered 2018-08-14: 60 mg via INTRAMUSCULAR

## 2018-08-14 MED ORDER — PREDNISONE 10 MG (21) PO TBPK: ORAL_TABLET | ORAL | 0 refills | Status: DC

## 2018-08-14 NOTE — Patient Instructions (Signed)

## 2018-08-14 NOTE — Progress Notes (Signed)
   Subjective:    Patient ID: Brenda Meyer, female    DOB: [birth year], 57 y.o.   MRN: [id number]  Chief Complaint  Patient presents with  . Knee Pain    L x 3 weeks, swelling, NKI, pain radiates down both sides    Knee Pain   The incident occurred more than 1 week ago. There was no injury mechanism. The pain is present in the left knee. The pain is at a severity of 4/10. The pain is moderate. The pain has been constant since onset. Pertinent negatives include no loss of motion, numbness or tingling. She reports no foreign bodies present. The symptoms are aggravated by weight bearing and movement. She has tried acetaminophen, NSAIDs and rest for the symptoms. The treatment provided mild relief.      Review of Systems  Neurological: Negative for tingling and numbness.  All other systems reviewed and are negative.      Objective:   Physical Exam Vitals signs reviewed.  Constitutional:      General: She is not in acute distress.    Appearance: She is well-developed.  Neck:     Musculoskeletal: Normal range of motion and neck supple.     Thyroid: No thyromegaly.  Cardiovascular:     Rate and Rhythm: Normal rate and regular rhythm.     Heart sounds: Normal heart sounds. No murmur.  Pulmonary:     Effort: Pulmonary effort is normal. No respiratory distress.     Breath sounds: Normal breath sounds. No wheezing.  Abdominal:     General: Bowel sounds are normal. There is no distension.     Palpations: Abdomen is soft.     Tenderness: There is no abdominal tenderness.  Musculoskeletal:        General: No tenderness.     Comments: Mild pain in left posterior knee with flexion and weight bearing   Skin:    General: Skin is warm and dry.  Neurological:     Mental Status: She is alert and oriented to person, place, and time.     Cranial Nerves: No cranial nerve deficit.     Deep Tendon Reflexes: Reflexes are normal and symmetric.  Psychiatric:        Behavior: Behavior normal.         Thought Content: Thought content normal.        Judgment: Judgment normal.     BP 137/82   Pulse 66   Temp (!) 97.2 F (36.2 C) (Oral)   Ht 5\' 7"  (1.702 m)   Wt 219 lb 9.6 oz (99.6 kg)   BMI 34.39 kg/m      Assessment & Plan:  Brenda Meyer comes in today with chief complaint of Knee Pain (L x 3 weeks, swelling, NKI, pain radiates down both sides)   Diagnosis and orders addressed:  1. Acute pain of left knee Rest Ice ROM exercises RTO if symptoms worsen or do not improve - predniSONE (STERAPRED UNI-PAK 21 TAB) 10 MG (21) TBPK tablet; Use as directed  Dispense: 21 tablet; Refill: 0 - diclofenac (VOLTAREN) 75 MG EC tablet; Take 1 tablet (75 mg total) by mouth 2 (two) times daily.  Dispense: 30 tablet; Refill: 0 - ketorolac (TORADOL) injection 60 mg   Brenda Rodneyhristy Graysyn Bache, FNP

## 2018-08-16 ENCOUNTER — Other Ambulatory Visit: Payer: Self-pay | Admitting: Family

## 2018-08-16 DIAGNOSIS — E119 Type 2 diabetes mellitus without complications: Secondary | ICD-10-CM

## 2018-08-16 MED FILL — metFORMIN HCL 1000 MG TABS: 1000 | 90 days supply | Qty: 180 | Fill #0

## 2018-08-16 MED FILL — CYCLOBENZAPRINE 10 MG TAB: 10 | 30 days supply | Qty: 90 | Fill #1

## 2018-09-13 ENCOUNTER — Other Ambulatory Visit: Payer: Self-pay | Admitting: Family

## 2018-09-13 DIAGNOSIS — E559 Vitamin D deficiency, unspecified: Secondary | ICD-10-CM

## 2018-09-13 MED FILL — LEVOTHYROXINE 50 MCG TABLET: 50 | 90 days supply | Qty: 90 | Fill #1

## 2018-09-13 NOTE — Telephone Encounter (Signed)
Last Vit D 07/29/16  50.2

## 2018-09-14 MED FILL — VIT D2 1.25 MG (50,000 UNIT: 1.25 MG | 84 days supply | Qty: 12 | Fill #0

## 2018-09-16 ENCOUNTER — Ambulatory Visit (INDEPENDENT_AMBULATORY_CARE_PROVIDER_SITE_OTHER): Payer: No Typology Code available for payment source

## 2018-09-16 ENCOUNTER — Ambulatory Visit (INDEPENDENT_AMBULATORY_CARE_PROVIDER_SITE_OTHER): Payer: No Typology Code available for payment source | Admitting: Family

## 2018-09-16 ENCOUNTER — Encounter: Payer: Self-pay | Admitting: Family

## 2018-09-16 VITALS — BP 124/67 | HR 77 | Temp 97.2°F | Ht 67.0 in | Wt 223.0 lb

## 2018-09-16 DIAGNOSIS — M25562 Pain in left knee: Secondary | ICD-10-CM

## 2018-09-16 NOTE — Progress Notes (Signed)
   Subjective:    Patient ID: Brenda Meyer, female    DOB: [birth year], 58 y.o.   MRN: [id number]  Chief Complaint  Patient presents with  . left knee pain    Knee Pain   The incident occurred more than 1 week ago (6 weeks). There was no injury mechanism. The pain is present in the left knee. The quality of the pain is described as aching. The pain is at a severity of 2/10. The pain is mild. Pertinent negatives include no numbness or tingling. She reports no foreign bodies present. She has tried rest and NSAIDs for the symptoms. The treatment provided mild relief.      Review of Systems  Neurological: Negative for tingling and numbness.  All other systems reviewed and are negative.      Objective:   Physical Exam Vitals signs reviewed.  Constitutional:      General: She is not in acute distress.    Appearance: She is well-developed.  Eyes:     Pupils: Pupils are equal, round, and reactive to light.  Neck:     Musculoskeletal: Normal range of motion and neck supple.     Thyroid: No thyromegaly.  Cardiovascular:     Rate and Rhythm: Normal rate and regular rhythm.     Heart sounds: Normal heart sounds. No murmur.  Pulmonary:     Effort: Pulmonary effort is normal. No respiratory distress.     Breath sounds: Normal breath sounds. No wheezing.  Abdominal:     General: Bowel sounds are normal. There is no distension.     Palpations: Abdomen is soft.     Tenderness: There is no abdominal tenderness.  Musculoskeletal:        General: No tenderness.     Comments: Full ROM of left knee, mild pain with flexion and extension.   Skin:    General: Skin is warm and dry.  Neurological:     Mental Status: She is alert and oriented to person, place, and time.     Cranial Nerves: No cranial nerve deficit.     Deep Tendon Reflexes: Reflexes are normal and symmetric.  Psychiatric:        Behavior: Behavior normal.        Thought Content: Thought content normal.        Judgment:  Judgment normal.     BP 124/67   Pulse 77   Temp (!) 97.2 F (36.2 C) (Oral)   Ht 5\' 7"  (1.702 m)   Wt 223 lb (101.2 kg)   BMI 34.93 kg/m      Assessment & Plan:  Brenda Meyer comes in today with chief complaint of left knee pain   Diagnosis and orders addressed:  1. Acute pain of left knee Since pain has been going on for greater than 6 weeks we will order MRI. Referral to PT If insurance denies MRI or depending on the results of MRI we will do Ortho referral Continue Diclofenac - DG Knee 1-2 Views Left; Future - MR KNEE LEFT WO CONTRAST; Future - Ambulatory referral to Physical Therapy   Jannifer Rodney, FNP

## 2018-09-16 NOTE — Patient Instructions (Signed)
Acute Knee Pain, Adult  Acute knee pain is sudden and may be caused by damage, swelling, or irritation of the muscles and tissues that support your knee. The injury may result from:   A fall.   An injury to your knee from twisting motions.   A hit to the knee.   Infection.  Acute knee pain may go away on its own with time and rest. If it does not, your health care provider may order tests to find the cause of the pain. These may include:   Imaging tests, such as an X-ray, MRI, or ultrasound.   Joint aspiration. In this test, fluid is removed from the knee.   Arthroscopy. In this test, a lighted tube is inserted into the knee and an image is projected onto a TV screen.   Biopsy. In this test, a sample of tissue is removed from the body and studied under a microscope.  Follow these instructions at home:  Pay attention to any changes in your symptoms. Take these actions to relieve your pain.  If you have a knee sleeve or brace:     Wear the sleeve or brace as told by your health care provider. Remove it only as told by your health care provider.   Loosen the sleeve or brace if your toes tingle, become numb, or turn cold and blue.   Keep the sleeve or brace clean.   If the sleeve or brace is not waterproof:  ? Do not let it get wet.  ? Cover it with a watertight covering when you take a bath or shower.  Activity   Rest your knee.   Do not do things that cause pain or make pain worse.   Avoid high-impact activities or exercises, such as running, jumping rope, or doing jumping jacks.   Work with a physical therapist to make a safe exercise program, as recommended by your health care provider. Do exercises as told by your physical therapist.  Managing pain, stiffness, and swelling     If directed, put ice on the knee:  ? Put ice in a plastic bag.  ? Place a towel between your skin and the bag.  ? Leave the ice on for 20 minutes, 2-3 times a day.   If directed, use an elastic bandage to put pressure  (compression) on your injured knee. This may control swelling, give support, and help with discomfort.  General instructions   Take over-the-counter and prescription medicines only as told by your health care provider.   Raise (elevate) your knee above the level of your heart when you are sitting or lying down.   Sleep with a pillow under your knee.   Do not use any products that contain nicotine or tobacco, such as cigarettes, e-cigarettes, and chewing tobacco. These can delay healing. If you need help quitting, ask your health care provider.   If you are overweight, work with your health care provider and a dietitian to set a weight-loss goal that is healthy and reasonable for you. Extra weight can put pressure on your knee.   Keep all follow-up visits as told by your health care provider. This is important.  Contact a health care provider if:   Your knee pain continues, changes, or gets worse.   You have a fever along with knee pain.   Your knee feels warm to the touch.   Your knee buckles or locks up.  Get help right away if:   Your knee swells,   and the swelling becomes worse.   You cannot move your knee.   You have severe pain in your knee.  Summary   Acute knee pain can be caused by a fall, an injury, an infection, or damage, swelling, or irritation of the tissues that support your knee.   Your health care provider may perform tests to find out the cause of the pain.   Pay attention to any changes in your symptoms. Relieve your pain with rest, medicines, light activity, and use of ice.   Get help if your pain continues or becomes worse, your knee swells, or you cannot move your knee.  This information is not intended to replace advice given to you by your health care provider. Make sure you discuss any questions you have with your health care provider.  Document Released: 06/15/2007 Document Revised: 01/28/2018 Document Reviewed: 01/28/2018  [employer] Interactive Patient Education  2019  [employer]

## 2018-09-29 ENCOUNTER — Ambulatory Visit (HOSPITAL_COMMUNITY)
Admission: RE | Admit: 2018-09-29 | Discharge: 2018-09-29 | Disposition: A | Discharge status: Home / Self Care | Payer: No Typology Code available for payment source | Source: Ambulatory Visit | Attending: Family | Admitting: Family

## 2018-09-29 DIAGNOSIS — M25562 Pain in left knee: Secondary | ICD-10-CM | POA: Insufficient documentation

## 2018-09-30 ENCOUNTER — Other Ambulatory Visit: Payer: Self-pay | Admitting: Family

## 2018-09-30 DIAGNOSIS — F32A Depression, unspecified: Secondary | ICD-10-CM

## 2018-09-30 DIAGNOSIS — S83242D Other tear of medial meniscus, current injury, left knee, subsequent encounter: Secondary | ICD-10-CM

## 2018-09-30 DIAGNOSIS — F329 Major depressive disorder, single episode, unspecified: Secondary | ICD-10-CM

## 2018-09-30 DIAGNOSIS — G8929 Other chronic pain: Secondary | ICD-10-CM

## 2018-09-30 DIAGNOSIS — M546 Pain in thoracic spine: Secondary | ICD-10-CM

## 2018-09-30 DIAGNOSIS — F419 Anxiety disorder, unspecified: Secondary | ICD-10-CM

## 2018-09-30 MED FILL — DULoxetine HCL 30 MG CPEP: 30 | 90 days supply | Qty: 180 | Fill #0

## 2018-09-30 MED FILL — CYCLOBENZAPRINE 10 MG TAB: 10 | 30 days supply | Qty: 90 | Fill #0

## 2018-10-05 ENCOUNTER — Encounter (INDEPENDENT_AMBULATORY_CARE_PROVIDER_SITE_OTHER): Payer: Self-pay | Admitting: Orthopaedic Surgery

## 2018-10-05 ENCOUNTER — Telehealth (INDEPENDENT_AMBULATORY_CARE_PROVIDER_SITE_OTHER): Payer: Self-pay

## 2018-10-05 ENCOUNTER — Ambulatory Visit (INDEPENDENT_AMBULATORY_CARE_PROVIDER_SITE_OTHER): Payer: No Typology Code available for payment source | Admitting: Orthopaedic Surgery

## 2018-10-05 VITALS — Ht 67.0 in | Wt 223.0 lb

## 2018-10-05 DIAGNOSIS — M25562 Pain in left knee: Secondary | ICD-10-CM

## 2018-10-05 MED ORDER — LIDOCAINE HCL 1 % IJ SOLN
3.0000 mL | INTRAMUSCULAR | Status: AC | PRN
  Administered 2018-10-05: 3 mL

## 2018-10-05 MED ORDER — METHYLPREDNISOLONE ACETATE 40 MG/ML IJ SUSP
40.0000 mg | INTRAMUSCULAR | Status: AC | PRN
  Administered 2018-10-05: 40 mg via INTRA_ARTICULAR

## 2018-10-05 NOTE — Progress Notes (Signed)
Office Visit Note   Patient: Brenda Meyer           Date of Birth: [birth year]           MRN: [id number] Visit Date: 10/05/2018              Requested by: Junie Spencer, FNP [address] [place], [place] [address] PCP: Junie Spencer, FNP   Assessment & Plan: Visit Diagnoses:  1. Acute pain of left knee     Plan: I feel that she would benefit from conservative treatment for now with quad strengthening exercises as well as trying a steroid injection in her knee and follow this up with hyaluronic acid.  I showed her knee model explained in detail how I would hold off any type of surgery given that this is a meniscal root tear and most of her issues I think are related more to the arthritis in her knee.  Certainly if she fails this conservative treatment we can try an arthroscopic intervention but she understands there is no guarantee that that would help based on the arthritis in her knee unless she is truly having locking catching where it is a mechanical issue and an arthroscopic intervention can help significantly with that.  She agreed to trying a steroid injection in her knee and tolerated it well.  I explained the risk and benefits of steroids as well.  I do feel that she is a candidate for hyaluronic acid as well.  However if she does start developing worsening locking catching with pivoting activities we would pursue an arthroscopic intervention.  We will see her back in 4 weeks from now to hopefully place hyaluronic acid in her left knee.  All questions concerns were answered and addressed.  Follow-Up Instructions: Return in about 4 weeks (around 11/02/2018).   Orders:  Orders Placed This Encounter  Procedures  . Large Joint Inj   No orders of the defined types were placed in this encounter.     Procedures: Large Joint Inj: L knee on 10/05/2018 10:18 AM Indications: diagnostic evaluation and pain Details: 22 G 1.5 in needle, superolateral approach  Arthrogram:  No  Medications: 3 mL lidocaine 1 %; 40 mg methylPREDNISolone acetate 40 MG/ML Outcome: tolerated well, no immediate complications Procedure, treatment alternatives, risks and benefits explained, specific risks discussed. Consent was given by the patient. Immediately prior to procedure a time out was called to verify the correct patient, procedure, equipment, support staff and site/side marked as required. Patient was prepped and draped in the usual sterile fashion.       Clinical Data: No additional findings.   Subjective: Chief Complaint  Patient presents with  . 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  She actually has an MRI that accompanies her that I can see on the consistent.  She sees [employer] [facility].  She has not had any problems for about 2 months ago she started developing swelling in her left knee.  Plain films were obtained an MRI.  The MRI did show a meniscal tear and she sent here for further ration treatment.  She said the swelling has gone down since she had the MRI.  She denies any issues with any other joint at all.  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  HPI  Review of Systems She currently  denies any headache, chest pain, shortness of breath, fever, chills, nausea, vomiting  Objective: Vital Signs: Ht 5\' 7"  (1.702 m)   Wt 223 lb (101.2 kg)   BMI 34.93 kg/m   Physical Exam She is alert and orient x3 and in no acute distress Ortho Exam Examination of her left knee shows no effusion today at all.  She does have some slight medial joint line tenderness but a negative McMurray's and negative Lockman's exam.  Her range of motion is full and her pain is minimal. Specialty Comments:  No specialty comments available.  Imaging: No results found. I did review the plain films of her knee as well as the MRI.  She does have incidental finding of an  enchondroma in the tibial metaphysis.  I do see fluid changes in the posterior horn of the medial meniscus.  There is moderate cartilage thinning in the medial compartment of her knee.  The plain films do show medial joint space narrowing as well.  There is patellofemoral arthritic changes as well.  PMFS History: Patient Active Problem List   Diagnosis Date Noted  . Hx of smoking 11/12/2016  . Atherosclerosis of aorta (HCC) 11/12/2016  . Anxiety and depression 08/28/2016  . Obesity (BMI 30-39.9) 07/29/2016  . Essential hypertension 06/12/2015  . Vitamin D deficiency 12/11/2014  . Back pain 12/08/2014  . Hypothyroidism 12/08/2014  . Hyperlipidemia 12/08/2014  . Diabetes mellitus, type 2 (HCC) 12/08/2014  . OSA (obstructive sleep apnea) 01/21/2011   Past Medical History:  Diagnosis Date  . Diabetes mellitus (HCC)   . Hyperlipidemia   . Hypertension   . Hypothyroidism   . OSA (obstructive sleep apnea)     Family History  Problem Relation Age of Onset  . Heart disease Mother   . Heart disease Father   . Brain cancer Brother   . Lung cancer Sister     Past Surgical History:  Procedure Laterality Date  . COLONOSCOPY  11/17/2011   Procedure: COLONOSCOPY;  Surgeon: Charna Elizabeth, MD;  Location: [facility];  Service: Endoscopy;  Laterality: N/A;  . SHOULDER SURGERY     right   Social History   Occupational History  . Occupation: Academic librarian: [employer]  Tobacco Use  . Smoking status: Former Smoker    Packs/day: 1.00    Years: 30.00    Pack years: 30.00    Types: Cigarettes    Last attempt to quit: 10/02/2010    Years since quitting: 8.0  . Smokeless tobacco: Never Used  Substance and Sexual Activity  . Alcohol use: No  . Drug use: Not on file  . Sexual activity: Not on file

## 2018-10-05 NOTE — Telephone Encounter (Signed)
Left knee gel injection ?

## 2018-10-07 NOTE — Telephone Encounter (Signed)
Noted  

## 2018-10-14 ENCOUNTER — Telehealth (INDEPENDENT_AMBULATORY_CARE_PROVIDER_SITE_OTHER): Payer: Self-pay

## 2018-10-14 NOTE — Telephone Encounter (Signed)
Submitted VOB for Monovisc, left knee. 

## 2018-10-18 ENCOUNTER — Telehealth (INDEPENDENT_AMBULATORY_CARE_PROVIDER_SITE_OTHER): Payer: Self-pay

## 2018-10-18 ENCOUNTER — Other Ambulatory Visit: Payer: Self-pay | Admitting: Family

## 2018-10-18 MED ORDER — LACTULOSE 20 GM/30ML PO SOLN: 20.0000 g | Freq: Every day | ORAL | 6 refills | Status: DC

## 2018-10-18 MED FILL — GENERLAC 10 GM/15 ML SOLN: 10 | 15 days supply | Qty: 450 | Fill #0

## 2018-10-18 NOTE — Telephone Encounter (Signed)
PA required for Monovisc, left knee. Initiated PA with [employer] at [place].  Per [place], [place] is Pending, Reference# [id number]

## 2018-10-21 ENCOUNTER — Ambulatory Visit (INDEPENDENT_AMBULATORY_CARE_PROVIDER_SITE_OTHER): Payer: No Typology Code available for payment source | Admitting: Family

## 2018-10-21 ENCOUNTER — Encounter: Payer: Self-pay | Admitting: Family

## 2018-10-21 ENCOUNTER — Ambulatory Visit (INDEPENDENT_AMBULATORY_CARE_PROVIDER_SITE_OTHER): Payer: No Typology Code available for payment source

## 2018-10-21 VITALS — BP 137/77 | HR 80 | Temp 97.9°F | Ht 67.0 in | Wt 219.6 lb

## 2018-10-21 DIAGNOSIS — K567 Ileus, unspecified: Secondary | ICD-10-CM

## 2018-10-21 DIAGNOSIS — K5900 Constipation, unspecified: Secondary | ICD-10-CM

## 2018-10-21 NOTE — Progress Notes (Signed)
Subjective:    Patient ID: Brenda Meyer, female    DOB: 04-Jun-1961, 58 y.o.   MRN: [id number]  Chief Complaint  Patient presents with  . Constipation   PT presents to the office today with constipation that started over the 1-2 weeks. States she has taken laxatives, enemas with no success and horrible abdominal pain. She states finally she had 3 small BM's a day ago and her abdominal pain has greatly improved.  Constipation  This is a new problem. The current episode started 1 to 4 weeks ago. The problem has been waxing and waning since onset. Her stool frequency is 2 to 3 times per week. The patient is on a high fiber diet. Associated symptoms include abdominal pain (at times), bloating, flatus and nausea. Pertinent negatives include no diarrhea, fever or vomiting. Risk factors include obesity. She has tried laxatives, fiber, enemas and stool softeners for the symptoms. The treatment provided mild relief.      Review of Systems  Constitutional: Negative for fever.  Gastrointestinal: Positive for abdominal pain (at times), bloating, constipation, flatus and nausea. Negative for diarrhea and vomiting.  All other systems reviewed and are negative.      Objective:   Physical Exam Vitals signs reviewed.  Constitutional:      General: She is not in acute distress.    Appearance: She is well-developed.  HENT:     Head: Normocephalic and atraumatic.     Right Ear: Tympanic membrane normal.     Left Ear: Tympanic membrane normal.  Eyes:     Pupils: Pupils are equal, round, and reactive to light.  Neck:     Musculoskeletal: Normal range of motion and neck supple.     Thyroid: No thyromegaly.  Cardiovascular:     Rate and Rhythm: Normal rate and regular rhythm.     Heart sounds: Normal heart sounds. No murmur.  Pulmonary:     Effort: Pulmonary effort is normal. No respiratory distress.     Breath sounds: Normal breath sounds. No wheezing.  Abdominal:     General: Bowel sounds  are normal. There is distension.     Palpations: Abdomen is soft.     Tenderness: There is no abdominal tenderness. There is no guarding or rebound.     Hernia: No hernia is present.     Comments: Hyperactive BS  Musculoskeletal: Normal range of motion.        General: No tenderness.  Skin:    General: Skin is warm and dry.  Neurological:     Mental Status: She is alert and oriented to person, place, and time.     Cranial Nerves: No cranial nerve deficit.     Deep Tendon Reflexes: Reflexes are normal and symmetric.  Psychiatric:        Behavior: Behavior normal.        Thought Content: Thought content normal.        Judgment: Judgment normal.     BP 137/77   Pulse 80   Temp 97.9 F (36.6 C) (Oral)   Ht 5\' 7"  (1.702 m)   Wt 219 lb 9.6 oz (99.6 kg)   BMI 34.39 kg/m      Assessment & Plan:  Brenda Meyer comes in today with chief complaint of Constipation   Diagnosis and orders addressed:  1. Constipation, unspecified constipation type - DG Abd 1 View; Future  2. Ileus (HCC)   Since abdominal pain improving and no nausea and vomiting will not  send to ED Rest bowels, full liquid diet                                                    If abdominal pain worsens or N&V start go to ED   Jannifer Rodney, FNP

## 2018-10-21 NOTE — Patient Instructions (Signed)
Ileus    Ileus is a condition that happens when the intestines, which are also called bowels, stop working correctly. The intestines are hollow organs that digest food after the food leaves the stomach. These organs are long, muscular tubes that connect the stomach to the rectum. When ileus occurs, the muscular contractions that cause food to move through the intestines do not happen as they normally would.  If the intestines stop working, food cannot pass through to get digested. This condition is a serious problem that usually requires hospitalization. It can cause symptoms such as nausea, abdominal pain, and bloating. Ileus can last from a few hours to a few days.  What are the causes?  This condition may be caused by:  · Surgery on the abdomen.  · An infection or inflammation in the abdomen. This includes inflammation of the lining of the abdomen (peritonitis).  · Infection or inflammation in other parts of the body, such as pneumonia or pancreatitis.  · Passage of gallstones or kidney stones.  · Damage to the nerves or blood vessels that go to the intestines.  · A collection of blood within the abdominal cavity.  · Imbalance in the salts in the blood (electrolytes).  · Injury to the brain or spinal cord.  · Medicines. Many medicines, including strong pain medicines, can cause ileus or make it worse.  If the intestines stop working because of a blockage, that is a different condition that is called a bowel obstruction.  What are the signs or symptoms?  Symptoms of this condition include:  · Bloating of the abdomen.  · Pain or discomfort in the abdomen.  · Poor appetite.  · Nausea and vomiting.  · Lack of normal bowel sounds, such as "growling" in the stomach.  How is this diagnosed?  This condition may be diagnosed with:  · A physical exam and medical history.  · X-rays or a CT scan of the abdomen.  You may also have other tests to help find the cause of the condition.  How is this treated?  This condition may  be treated by:  · Resting the intestines until they start to work again. This is often done by:  ? Stopping oral intake of food and drink. You will be given fluid through an IV to prevent dehydration.  ? Placing a small tube (nasogastric tube or NG tube) that is passed through your nose and into your stomach. The tube is attached to a suction device and keeps the stomach emptied out. This allows the bowels to rest and helps to reduce nausea and vomiting.  · Correcting any electrolyte imbalance by giving supplements in the IV fluid.  · Stopping any medicines that might make ileus worse.  · Treating any condition that may have caused ileus.  Follow these instructions at home:  Eating and drinking    · Follow instructions from your health care provider about:  ? What to eat and drink. You may be told to start eating a bland diet. Over time, you may slowly resume a more normal, healthy diet.  ? How much to eat and drink. You should eat small meals often and stop eating when you feel full.  · Avoid alcohol.  General instructions  · Take over-the-counter and prescription medicines only as told by your health care provider.  · Rest as told by your health care provider.  · Avoid sitting for a long time without moving. Get up to take short walks every 1-2   hours. Ask for help if you feel weak or unsteady.  · Keep all follow-up visits as told by your health care provider. This is important.  Contact a health care provider if:  · You have nausea, vomiting, or abdominal discomfort.  · You have a fever.  Get help right away if:  · You have severe abdominal pain or bloating.  · You cannot eat or drink without vomiting.  Summary  · Ileus is a condition that happens when the intestines, which are also called bowels, stop working correctly.  · When ileus occurs, the muscular contractions that cause food to move through the intestines do not happen as they normally would.  · Ileus can cause symptoms such as nausea, abdominal pain, and  bloating.  · Treatment may involve getting IV fluids and having a nasogastric tube placed to keep your stomach emptied out until the intestines start working again.  This information is not intended to replace advice given to you by your health care provider. Make sure you discuss any questions you have with your health care provider.  Document Released: 08/21/2003 Document Revised: 12/14/2017 Document Reviewed: 12/14/2017  Elsevier Interactive Patient Education © 2019 Elsevier Inc.

## 2018-10-22 ENCOUNTER — Other Ambulatory Visit: Payer: Self-pay | Admitting: Family

## 2018-10-22 DIAGNOSIS — I10 Essential (primary) hypertension: Secondary | ICD-10-CM

## 2018-10-22 DIAGNOSIS — E7800 Pure hypercholesterolemia, unspecified: Secondary | ICD-10-CM

## 2018-10-22 DIAGNOSIS — E785 Hyperlipidemia, unspecified: Secondary | ICD-10-CM

## 2018-10-22 MED FILL — ATORVASTATIN 20 MG TABLET: 20 | 90 days supply | Qty: 90 | Fill #0

## 2018-10-22 MED FILL — LOSARTAN POTASSIUM 50 MG TA: 50 | 90 days supply | Qty: 90 | Fill #0

## 2018-10-25 ENCOUNTER — Other Ambulatory Visit: Payer: Self-pay | Admitting: Family

## 2018-10-25 ENCOUNTER — Emergency Department (HOSPITAL_COMMUNITY)
Admission: EM | Admit: 2018-10-25 | Discharge: 2018-10-25 | Disposition: A | Discharge status: Home / Self Care | Payer: No Typology Code available for payment source | Attending: Emergency Medicine | Admitting: Emergency Medicine

## 2018-10-25 ENCOUNTER — Other Ambulatory Visit: Payer: Self-pay

## 2018-10-25 ENCOUNTER — Emergency Department (HOSPITAL_COMMUNITY): Payer: No Typology Code available for payment source

## 2018-10-25 DIAGNOSIS — Z87891 Personal history of nicotine dependence: Secondary | ICD-10-CM | POA: Diagnosis not present

## 2018-10-25 DIAGNOSIS — Z7984 Long term (current) use of oral hypoglycemic drugs: Secondary | ICD-10-CM | POA: Insufficient documentation

## 2018-10-25 DIAGNOSIS — Z79899 Other long term (current) drug therapy: Secondary | ICD-10-CM | POA: Insufficient documentation

## 2018-10-25 DIAGNOSIS — K567 Ileus, unspecified: Secondary | ICD-10-CM | POA: Insufficient documentation

## 2018-10-25 DIAGNOSIS — K56600 Partial intestinal obstruction, unspecified as to cause: Secondary | ICD-10-CM

## 2018-10-25 DIAGNOSIS — Z7982 Long term (current) use of aspirin: Secondary | ICD-10-CM | POA: Insufficient documentation

## 2018-10-25 DIAGNOSIS — I10 Essential (primary) hypertension: Secondary | ICD-10-CM | POA: Insufficient documentation

## 2018-10-25 DIAGNOSIS — E119 Type 2 diabetes mellitus without complications: Secondary | ICD-10-CM | POA: Diagnosis not present

## 2018-10-25 DIAGNOSIS — R1084 Generalized abdominal pain: Secondary | ICD-10-CM | POA: Diagnosis present

## 2018-10-25 DIAGNOSIS — E039 Hypothyroidism, unspecified: Secondary | ICD-10-CM | POA: Diagnosis not present

## 2018-10-25 LAB — COMPREHENSIVE METABOLIC PANEL
ALT: 13 U/L (ref 0–44)
AST: 16 U/L (ref 15–41)
Albumin: 3.7 g/dL (ref 3.5–5.0)
Alkaline Phosphatase: 117 U/L (ref 38–126)
Anion gap: 11 (ref 5–15)
BUN: <5 mg/dL — ABNORMAL LOW (ref 6–20)
CO2: 25 mmol/L (ref 22–32)
Calcium: 9.2 mg/dL (ref 8.9–10.3)
Chloride: 96 mmol/L — ABNORMAL LOW (ref 98–111)
Creatinine, Ser: 0.66 mg/dL (ref 0.44–1.00)
GFR calc Af Amer: >60 mL/min (ref >60)
GFR calc non Af Amer: >60 mL/min (ref >60)
Glucose, Bld: 106 mg/dL — ABNORMAL HIGH (ref 70–99)
Potassium: 4 mmol/L (ref 3.5–5.1)
Sodium: 132 mmol/L — ABNORMAL LOW (ref 135–145)
Total Bilirubin: 0.9 mg/dL (ref 0.3–1.2)
Total Protein: 7 g/dL (ref 6.5–8.1)

## 2018-10-25 LAB — CBC WITH DIFFERENTIAL/PLATELET
Abs Immature Granulocytes: 0.06 10*3/uL (ref 0.00–0.07)
Basophils Absolute: 0 10*3/uL (ref 0.0–0.1)
Basophils Relative: 0 %
Eosinophils Absolute: 0.1 10*3/uL (ref 0.0–0.5)
Eosinophils Relative: 1 %
HCT: 37.8 % (ref 36.0–46.0)
Hemoglobin: 12.3 g/dL (ref 12.0–15.0)
Immature Granulocytes: 1 %
Lymphocytes Relative: 22 %
Lymphs Abs: 2.2 10*3/uL (ref 0.7–4.0)
MCH: 28.8 pg (ref 26.0–34.0)
MCHC: 32.5 g/dL (ref 30.0–36.0)
MCV: 88.5 fL (ref 80.0–100.0)
Monocytes Absolute: 0.7 10*3/uL (ref 0.1–1.0)
Monocytes Relative: 7 %
Neutro Abs: 7.2 10*3/uL (ref 1.7–7.7)
Neutrophils Relative %: 69 %
Platelets: 250 10*3/uL (ref 150–400)
RBC: 4.27 MIL/uL (ref 3.87–5.11)
RDW: 13.8 % (ref 11.5–15.5)
WBC: 10.3 10*3/uL (ref 4.0–10.5)
nRBC: 0 % (ref 0.0–0.2)

## 2018-10-25 LAB — URINALYSIS, ROUTINE W REFLEX MICROSCOPIC
Bilirubin Urine: NEGATIVE
Glucose, UA: NEGATIVE mg/dL
Ketones, ur: NEGATIVE mg/dL
Leukocytes,Ua: NEGATIVE
Nitrite: NEGATIVE
Protein, ur: NEGATIVE mg/dL
Specific Gravity, Urine: 1.002 — ABNORMAL LOW (ref 1.005–1.030)
pH: 6 (ref 5.0–8.0)

## 2018-10-25 MED ORDER — IOHEXOL 300 MG/ML  SOLN
100.0000 mL | Freq: Once | INTRAMUSCULAR | Status: AC | PRN
  Administered 2018-10-25: 100 mL via INTRAVENOUS

## 2018-10-25 NOTE — ED Provider Notes (Signed)
[facility] EMERGENCY DEPARTMENT Provider Note   CSN: [id number] Arrival date & time: 10/25/18  1022    History   Chief Complaint Chief Complaint  Patient presents with  . Abdominal Pain    HPI Brenda Meyer is a 58 y.o. female.     Patient is a 58 year old female who presents with abdominal pain.  She states that she has been constipated for about 2 weeks.  She saw her PCP who did an x-ray confirming an ileus.  She has been on lactulose for about a week and a half with no improvement.  She has worsening distention.  She has some crampy abdominal discomfort but no specific areas of pain.  No nausea or vomiting.  No fevers.  No urinary symptoms.  No prior abdominal surgeries other than a hysterectomy.     Past Medical History:  Diagnosis Date  . Diabetes mellitus (HCC)   . Hyperlipidemia   . Hypertension   . Hypothyroidism   . OSA (obstructive sleep apnea)     Patient Active Problem List   Diagnosis Date Noted  . Hx of smoking 11/12/2016  . Atherosclerosis of aorta (HCC) 11/12/2016  . Anxiety and depression 08/28/2016  . Obesity (BMI 30-39.9) 07/29/2016  . Essential hypertension 06/12/2015  . Vitamin D deficiency 12/11/2014  . Back pain 12/08/2014  . Hypothyroidism 12/08/2014  . Hyperlipidemia 12/08/2014  . Diabetes mellitus, type 2 (HCC) 12/08/2014  . OSA (obstructive sleep apnea) 01/21/2011    Past Surgical History:  Procedure Laterality Date  . COLONOSCOPY  11/17/2011   Procedure: COLONOSCOPY;  Surgeon: Charna Elizabeth, MD;  Location: [facility];  Service: Endoscopy;  Laterality: N/A;  . SHOULDER SURGERY     right     OB History   No obstetric history on file.      Home Medications    Prior to Admission medications   Medication Sig Start Date End Date Taking? Authorizing Provider  aspirin EC 81 MG tablet Take 1 tablet (81 mg total) by mouth daily. 05/21/15  Yes Hawks, Christy A, FNP  atorvastatin (LIPITOR) 20 MG tablet TAKE 1 TABLET  BY MOUTH DAILY. Patient taking differently: Take 20 mg by mouth daily at 6 PM.  10/22/18  Yes Hawks, Christy A, FNP  Calcium Citrate-Vitamin D (CALCIUM + D PO) Take 600 mg by mouth daily.    Yes [provider]  cyclobenzaprine (FLEXERIL) 10 MG tablet TAKE 1 TABLET BY MOUTH 3 TIMES A DAY AS NEEDED FOR MUSCLE SPASM Patient taking differently: Take 10 mg by mouth 3 (three) times daily as needed for muscle spasms.  09/30/18  Yes Hawks, Christy A, FNP  diclofenac (VOLTAREN) 75 MG EC tablet Take 1 tablet (75 mg total) by mouth 2 (two) times daily. 08/14/18  Yes Hawks, Christy A, FNP  diphenhydrAMINE (BENADRYL) 25 MG tablet Take 25 mg by mouth at bedtime as needed for sleep.   Yes [provider]  DULoxetine (CYMBALTA) 30 MG capsule TAKE 2 CAPSULES BY MOUTH DAILY. Patient taking differently: Take 60 mg by mouth daily.  09/30/18  Yes Hawks, Christy A, FNP  escitalopram (LEXAPRO) 20 MG tablet TAKE 1 TABLET (20 MG TOTAL) BY MOUTH DAILY. 07/26/18  Yes Hawks, Christy A, FNP  Lactulose 20 GM/30ML SOLN Take 30 mLs (20 g total) by mouth daily. 10/18/18  Yes Hawks, Christy A, FNP  levothyroxine (SYNTHROID, LEVOTHROID) 50 MCG tablet TAKE 1 TABLET (50 MCG TOTAL) BY MOUTH DAILY. 06/17/18  Yes Junie Spencer, FNP  losartan (COZAAR) 50 MG tablet TAKE 1 TABLET BY MOUTH DAILY. Patient taking differently: Take 50 mg by mouth daily.  10/22/18  Yes Hawks, Christy A, FNP  metFORMIN (GLUCOPHAGE) 1000 MG tablet TAKE 1 TABLET BY MOUTH TWICE A DAY WITH A MEAL. Patient taking differently: Take 1,000 mg by mouth 2 (two) times daily with a meal.  08/16/18  Yes Hawks, Christy A, FNP  Vitamin D, Ergocalciferol, (DRISDOL) 1.25 MG (50000 UT) CAPS capsule TAKE 1 CAPSULE BY MOUTH EVERY 7 DAYS. Patient taking differently: Take 50,000 Units by mouth every Sunday.  09/14/18  Yes Hawks, Edilia Bo, FNP    Family History Family History  Problem Relation Age of Onset  . Heart disease Mother   . Heart disease Father   .  Brain cancer Brother   . Lung cancer Sister     Social History Social History   Tobacco Use  . Smoking status: Former Smoker    Packs/day: 1.00    Years: 30.00    Pack years: 30.00    Types: Cigarettes    Last attempt to quit: 10/02/2010    Years since quitting: 8.0  . Smokeless tobacco: Never Used  Substance Use Topics  . Alcohol use: No  . Drug use: Not on file     Allergies   Ace inhibitors   Review of Systems Review of Systems  Constitutional: Negative for chills, diaphoresis, fatigue and fever.  HENT: Negative for congestion, rhinorrhea and sneezing.   Eyes: Negative.   Respiratory: Negative for cough, chest tightness and shortness of breath.   Cardiovascular: Negative for chest pain and leg swelling.  Gastrointestinal: Positive for abdominal distention, abdominal pain and constipation. Negative for blood in stool, diarrhea, nausea and vomiting.  Genitourinary: Negative for difficulty urinating, flank pain, frequency and hematuria.  Musculoskeletal: Negative for arthralgias and back pain.  Skin: Negative for rash.  Neurological: Negative for dizziness, speech difficulty, weakness, numbness and headaches.     Physical Exam Updated Vital Signs BP 126/62   Pulse 72   Temp 98.3 F (36.8 C) (Oral)   Resp 18   Wt 99.3 kg   SpO2 99%   BMI 34.30 kg/m   Physical Exam Constitutional:      Appearance: She is well-developed.  HENT:     Head: Normocephalic and atraumatic.  Eyes:     Pupils: Pupils are equal, round, and reactive to light.  Neck:     Musculoskeletal: Normal range of motion and neck supple.  Cardiovascular:     Rate and Rhythm: Normal rate and regular rhythm.     Heart sounds: Normal heart sounds.  Pulmonary:     Effort: Pulmonary effort is normal. No respiratory distress.     Breath sounds: Normal breath sounds. No wheezing or rales.  Chest:     Chest wall: No tenderness.  Abdominal:     General: Bowel sounds are normal. There is distension.      Palpations: Abdomen is soft.     Tenderness: There is no abdominal tenderness. There is no guarding or rebound.     Comments: Tympanic to percussion  Musculoskeletal: Normal range of motion.  Lymphadenopathy:     Cervical: No cervical adenopathy.  Skin:    General: Skin is warm and dry.     Findings: No rash.  Neurological:     Mental Status: She is alert and oriented to person, place, and time.      ED Treatments / Results  Labs (all labs ordered are  listed, but only abnormal results are displayed) Labs Reviewed  COMPREHENSIVE METABOLIC PANEL - Abnormal; Notable for the following components:      Result Value   Sodium 132 (*)    Chloride 96 (*)    Glucose, Bld 106 (*)    BUN <5 (*)    All other components within normal limits  URINALYSIS, ROUTINE W REFLEX MICROSCOPIC - Abnormal; Notable for the following components:   Color, Urine STRAW (*)    Specific Gravity, Urine 1.002 (*)    Hgb urine dipstick SMALL (*)    Bacteria, UA RARE (*)    All other components within normal limits  CBC WITH DIFFERENTIAL/PLATELET    EKG None  Radiology Ct Abdomen Pelvis W Contrast  Result Date: 10/25/2018 CLINICAL DATA:  Abdominal pain and distention. Constipation for 2 days. EXAM: CT ABDOMEN AND PELVIS WITH CONTRAST TECHNIQUE: Multidetector CT imaging of the abdomen and pelvis was performed using the standard protocol following bolus administration of intravenous contrast. CONTRAST:  OMNIPAQUE IOHEXOL 300 MG/ML  SOLN COMPARISON:  KUB October 21, 2018 FINDINGS: Lower chest: No acute abnormality. Hepatobiliary: Cholelithiasis is identified in a decompressed gallbladder. No liver masses identified. The portal vein is normal. Pancreas: Unremarkable. No pancreatic ductal dilatation or surrounding inflammatory changes. Spleen: Normal in size without focal abnormality. Adrenals/Urinary Tract: Adrenal glands are unremarkable. Kidneys are normal, without renal calculi, focal lesion, or  hydronephrosis. Bladder is unremarkable. Stomach/Bowel: The stomach and small bowel are normal. The colon demonstrates no obstruction. Colon is somewhat prominent in caliber from the cecum through to through the transverse colon measuring up to 7.8 cm in the cecum. No wall thickening or pericolonic stranding. The appendix is normal. Vascular/Lymphatic: There is atherosclerotic change in the nonaneurysmal aorta, iliac vessels, and femoral vessels. The proximal celiac artery is significantly narrowed best seen on sagittal image 123. More distally, the celiac artery branches are normal in caliber. The SMA is normal in caliber. No adenopathy. Reproductive: Status post hysterectomy. No adnexal masses. Other: No abdominal wall hernia or abnormality. No abdominopelvic ascites. Musculoskeletal: No acute or significant osseous findings. IMPRESSION: 1. The colon is prominent in caliber from the cecum through the transverse colon measuring up to 7.8 cm at the cecum. No obvious obstruction is seen. No wall thickening or pericolonic fat stranding. The findings likely represent a colonic ileus. 2. Significant narrowing of the proximal celiac artery best seen on sagittal image 123. 3. Cholelithiasis. Electronically Signed   By: Gerome Sam III M.D   On: 10/25/2018 13:23    Procedures Procedures (including critical care time)  Medications Ordered in ED Medications  iohexol (OMNIPAQUE) 300 MG/ML solution 100 mL (100 mLs Intravenous Contrast Given 10/25/18 1244)     Initial Impression / Assessment and Plan / ED Course  I have reviewed the triage vital signs and the nursing notes.  Pertinent labs & imaging results that were available during my care of the patient were reviewed by me and considered in my medical decision making (see chart for details).       Patient is a 58 year old female who presents with an ileus.  She is here because she has not improved on lactulose and wants to make sure she does not have  an obstruction.  She does not have any abdominal tenderness on exam.  CT scan shows no evidence of obstruction but there is evidence of an ileus.  There is no significant stool burden.  Her labs are non-concerning.  I did advise that it  would be best to admit her overnight for observation and IV fluids but she does not want to be admitted.  She will continue her home regimen and follow-up with her primary care doctor tomorrow.  She was given strict return precautions.  Final Clinical Impressions(s) / ED Diagnoses   Final diagnoses:  Ileus [facility](HCC)    ED Discharge Orders    None       Rolan BuccoBelfi, Evalee Gerard, MD 10/25/18 1439

## 2018-10-25 NOTE — ED Notes (Signed)
Patient verbalizes understanding of discharge instructions. Opportunity for questioning and answers were provided. Armband removed by staff, pt discharged from ED. Ambulated out to lobby  

## 2018-10-25 NOTE — ED Triage Notes (Signed)
Pt in from home w/abd pain/distention and constipation x 2 wks. States she had an xray to confirm ileus on Thursday. Has also been on Lactulose x 2 wks, with no BM.

## 2018-10-26 ENCOUNTER — Ambulatory Visit: Payer: No Typology Code available for payment source | Admitting: Family Medicine

## 2018-10-26 ENCOUNTER — Other Ambulatory Visit: Payer: Self-pay | Admitting: Family

## 2018-10-26 MED ORDER — METOCLOPRAMIDE HCL 10 MG PO TABS: 10.0000 mg | ORAL_TABLET | Freq: Three times a day (TID) | ORAL | 0 refills | Status: DC

## 2018-10-26 MED FILL — METOCLOPRAMIDE 10 MG TABLET: 10 | 30 days supply | Qty: 90 | Fill #0

## 2018-10-29 ENCOUNTER — Telehealth (INDEPENDENT_AMBULATORY_CARE_PROVIDER_SITE_OTHER): Payer: Self-pay

## 2018-10-29 NOTE — Telephone Encounter (Signed)
Talked with patient and advised her that she was denied for Monovisc injection, left knee.  Per denial letter, gel injection is not medically necessary.  Patient would like to know what other options are available?  Cb# is [id number].  Please advise.  Thank you.

## 2018-10-29 NOTE — Telephone Encounter (Signed)
Please advise 

## 2018-11-01 ENCOUNTER — Observation Stay (HOSPITAL_COMMUNITY)
Admission: EM | Admit: 2018-11-01 | Discharge: 2018-11-02 | Disposition: A | Discharge status: Home / Self Care | Payer: No Typology Code available for payment source | Attending: Internal Medicine | Admitting: Internal Medicine

## 2018-11-01 ENCOUNTER — Encounter (HOSPITAL_COMMUNITY): Payer: Self-pay | Admitting: Emergency Medicine

## 2018-11-01 ENCOUNTER — Other Ambulatory Visit: Payer: Self-pay

## 2018-11-01 ENCOUNTER — Emergency Department (HOSPITAL_COMMUNITY): Payer: No Typology Code available for payment source

## 2018-11-01 DIAGNOSIS — F419 Anxiety disorder, unspecified: Secondary | ICD-10-CM | POA: Diagnosis not present

## 2018-11-01 DIAGNOSIS — Z7984 Long term (current) use of oral hypoglycemic drugs: Secondary | ICD-10-CM | POA: Insufficient documentation

## 2018-11-01 DIAGNOSIS — G4733 Obstructive sleep apnea (adult) (pediatric): Secondary | ICD-10-CM | POA: Diagnosis not present

## 2018-11-01 DIAGNOSIS — F320 Major depressive disorder, single episode, mild: Secondary | ICD-10-CM | POA: Diagnosis present

## 2018-11-01 DIAGNOSIS — E039 Hypothyroidism, unspecified: Secondary | ICD-10-CM | POA: Diagnosis not present

## 2018-11-01 DIAGNOSIS — Z90710 Acquired absence of both cervix and uterus: Secondary | ICD-10-CM | POA: Diagnosis not present

## 2018-11-01 DIAGNOSIS — Z7982 Long term (current) use of aspirin: Secondary | ICD-10-CM | POA: Insufficient documentation

## 2018-11-01 DIAGNOSIS — E785 Hyperlipidemia, unspecified: Secondary | ICD-10-CM | POA: Diagnosis present

## 2018-11-01 DIAGNOSIS — Z791 Long term (current) use of non-steroidal anti-inflammatories (NSAID): Secondary | ICD-10-CM | POA: Insufficient documentation

## 2018-11-01 DIAGNOSIS — E119 Type 2 diabetes mellitus without complications: Secondary | ICD-10-CM

## 2018-11-01 DIAGNOSIS — F32A Depression, unspecified: Secondary | ICD-10-CM | POA: Diagnosis present

## 2018-11-01 DIAGNOSIS — Z8249 Family history of ischemic heart disease and other diseases of the circulatory system: Secondary | ICD-10-CM | POA: Insufficient documentation

## 2018-11-01 DIAGNOSIS — K567 Ileus, unspecified: Secondary | ICD-10-CM | POA: Insufficient documentation

## 2018-11-01 DIAGNOSIS — I10 Essential (primary) hypertension: Secondary | ICD-10-CM | POA: Diagnosis present

## 2018-11-01 DIAGNOSIS — Z79890 Hormone replacement therapy (postmenopausal): Secondary | ICD-10-CM | POA: Diagnosis not present

## 2018-11-01 DIAGNOSIS — Z87891 Personal history of nicotine dependence: Secondary | ICD-10-CM | POA: Insufficient documentation

## 2018-11-01 DIAGNOSIS — E860 Dehydration: Secondary | ICD-10-CM | POA: Diagnosis present

## 2018-11-01 DIAGNOSIS — K5900 Constipation, unspecified: Secondary | ICD-10-CM | POA: Diagnosis not present

## 2018-11-01 DIAGNOSIS — Z79899 Other long term (current) drug therapy: Secondary | ICD-10-CM | POA: Diagnosis not present

## 2018-11-01 DIAGNOSIS — F329 Major depressive disorder, single episode, unspecified: Secondary | ICD-10-CM | POA: Diagnosis not present

## 2018-11-01 DIAGNOSIS — K8020 Calculus of gallbladder without cholecystitis without obstruction: Secondary | ICD-10-CM | POA: Insufficient documentation

## 2018-11-01 LAB — URINALYSIS, ROUTINE W REFLEX MICROSCOPIC
Bilirubin Urine: NEGATIVE
Glucose, UA: NEGATIVE mg/dL
Ketones, ur: NEGATIVE mg/dL
Leukocytes,Ua: NEGATIVE
Nitrite: NEGATIVE
Protein, ur: NEGATIVE mg/dL
Specific Gravity, Urine: 1.002 — ABNORMAL LOW (ref 1.005–1.030)
pH: 6 (ref 5.0–8.0)

## 2018-11-01 LAB — COMPREHENSIVE METABOLIC PANEL
ALT: 11 U/L (ref 0–44)
AST: 14 U/L — ABNORMAL LOW (ref 15–41)
Albumin: 3.6 g/dL (ref 3.5–5.0)
Alkaline Phosphatase: 140 U/L — ABNORMAL HIGH (ref 38–126)
Anion gap: 9 (ref 5–15)
BUN: 7 mg/dL (ref 6–20)
CO2: 24 mmol/L (ref 22–32)
Calcium: 9.3 mg/dL (ref 8.9–10.3)
Chloride: 103 mmol/L (ref 98–111)
Creatinine, Ser: 0.69 mg/dL (ref 0.44–1.00)
GFR calc Af Amer: >60 mL/min (ref >60)
GFR calc non Af Amer: >60 mL/min (ref >60)
Glucose, Bld: 139 mg/dL — ABNORMAL HIGH (ref 70–99)
Potassium: 4 mmol/L (ref 3.5–5.1)
Sodium: 136 mmol/L (ref 135–145)
Total Bilirubin: 0.4 mg/dL (ref 0.3–1.2)
Total Protein: 6.6 g/dL (ref 6.5–8.1)

## 2018-11-01 LAB — CBC WITH DIFFERENTIAL/PLATELET
Abs Immature Granulocytes: 0.07 10*3/uL (ref 0.00–0.07)
Basophils Absolute: 0 10*3/uL (ref 0.0–0.1)
Basophils Relative: 0 %
Eosinophils Absolute: 0.2 10*3/uL (ref 0.0–0.5)
Eosinophils Relative: 2 %
HCT: 37.5 % (ref 36.0–46.0)
Hemoglobin: 11.6 g/dL — ABNORMAL LOW (ref 12.0–15.0)
Immature Granulocytes: 1 %
Lymphocytes Relative: 24 %
Lymphs Abs: 2.2 10*3/uL (ref 0.7–4.0)
MCH: 28.1 pg (ref 26.0–34.0)
MCHC: 30.9 g/dL (ref 30.0–36.0)
MCV: 90.8 fL (ref 80.0–100.0)
Monocytes Absolute: 0.5 10*3/uL (ref 0.1–1.0)
Monocytes Relative: 6 %
Neutro Abs: 6.2 10*3/uL (ref 1.7–7.7)
Neutrophils Relative %: 67 %
Platelets: 250 10*3/uL (ref 150–400)
RBC: 4.13 MIL/uL (ref 3.87–5.11)
RDW: 13.7 % (ref 11.5–15.5)
WBC: 9.2 10*3/uL (ref 4.0–10.5)
nRBC: 0 % (ref 0.0–0.2)

## 2018-11-01 LAB — LIPASE, BLOOD: Lipase: 24 U/L (ref 11–51)

## 2018-11-01 LAB — GLUCOSE, CAPILLARY
Glucose-Capillary: 74 mg/dL (ref 70–99)
Glucose-Capillary: 107 mg/dL — ABNORMAL HIGH (ref 70–99)

## 2018-11-01 MED ORDER — SORBITOL 70 % SOLN
960.0000 mL | TOPICAL_OIL | Freq: Once | ORAL | Status: DC
  Filled 2018-11-01: qty 473

## 2018-11-01 MED ORDER — LACTULOSE 10 GM/15ML PO SOLN
20.0000 g | Freq: Every day | ORAL | Status: DC
  Administered 2018-11-02: 20 g via ORAL
  Filled 2018-11-01: qty 30

## 2018-11-01 MED ORDER — BISACODYL 10 MG RE SUPP
10.0000 mg | Freq: Every morning | RECTAL | Status: DC
  Filled 2018-11-01: qty 1

## 2018-11-01 MED ORDER — ESCITALOPRAM OXALATE 20 MG PO TABS
20.0000 mg | ORAL_TABLET | Freq: Every day | ORAL | Status: DC
  Administered 2018-11-01 – 2018-11-02 (×2): 20 mg via ORAL
  Filled 2018-11-01 (×2): qty 1

## 2018-11-01 MED ORDER — ASPIRIN EC 81 MG PO TBEC
81.0000 mg | DELAYED_RELEASE_TABLET | Freq: Every day | ORAL | Status: DC
  Administered 2018-11-02: 81 mg via ORAL
  Filled 2018-11-01: qty 1

## 2018-11-01 MED ORDER — ENOXAPARIN SODIUM 40 MG/0.4ML ~~LOC~~ SOLN: 40.0000 mg | SUBCUTANEOUS | Status: DC

## 2018-11-01 MED ORDER — ATORVASTATIN CALCIUM 10 MG PO TABS
20.0000 mg | ORAL_TABLET | Freq: Every day | ORAL | Status: DC
  Administered 2018-11-01 – 2018-11-02 (×2): 20 mg via ORAL
  Filled 2018-11-01 (×2): qty 2

## 2018-11-01 MED ORDER — INSULIN ASPART 100 UNIT/ML ~~LOC~~ SOLN: 0.0000 [IU] | Freq: Three times a day (TID) | SUBCUTANEOUS | Status: DC

## 2018-11-01 MED ORDER — METOCLOPRAMIDE HCL 10 MG PO TABS
10.0000 mg | ORAL_TABLET | Freq: Three times a day (TID) | ORAL | Status: DC
  Administered 2018-11-01 – 2018-11-02 (×2): 10 mg via ORAL
  Filled 2018-11-01 (×2): qty 1

## 2018-11-01 MED ORDER — DULOXETINE HCL 60 MG PO CPEP
60.0000 mg | ORAL_CAPSULE | Freq: Every day | ORAL | Status: DC
  Administered 2018-11-02: 60 mg via ORAL
  Filled 2018-11-01: qty 1

## 2018-11-01 MED ORDER — INSULIN ASPART 100 UNIT/ML ~~LOC~~ SOLN: 0.0000 [IU] | Freq: Every day | SUBCUTANEOUS | Status: DC

## 2018-11-01 MED ORDER — LOSARTAN POTASSIUM 50 MG PO TABS
50.0000 mg | ORAL_TABLET | Freq: Every day | ORAL | Status: DC
  Administered 2018-11-02: 50 mg via ORAL
  Filled 2018-11-01: qty 1

## 2018-11-01 MED ORDER — SORBITOL 70 % SOLN
960.0000 mL | TOPICAL_OIL | Freq: Two times a day (BID) | ORAL | Status: DC
  Administered 2018-11-01: 960 mL via RECTAL
  Filled 2018-11-01 (×3): qty 473

## 2018-11-01 MED ORDER — SODIUM CHLORIDE 0.9 % IV BOLUS
1000.0000 mL | Freq: Once | INTRAVENOUS | Status: AC
  Administered 2018-11-01: 1000 mL via INTRAVENOUS

## 2018-11-01 MED ORDER — CYCLOBENZAPRINE HCL 10 MG PO TABS
10.0000 mg | ORAL_TABLET | Freq: Three times a day (TID) | ORAL | Status: DC | PRN
  Administered 2018-11-01: 10 mg via ORAL
  Filled 2018-11-01: qty 1

## 2018-11-01 MED ORDER — DIPHENHYDRAMINE HCL 25 MG PO TABS
25.0000 mg | ORAL_TABLET | Freq: Every evening | ORAL | Status: DC | PRN
  Administered 2018-11-01: 25 mg via ORAL
  Filled 2018-11-01 (×3): qty 1

## 2018-11-01 MED ORDER — METFORMIN HCL 500 MG PO TABS
1000.0000 mg | ORAL_TABLET | Freq: Two times a day (BID) | ORAL | Status: DC
  Administered 2018-11-01: 1000 mg via ORAL
  Filled 2018-11-01: qty 2

## 2018-11-01 MED ORDER — LEVOTHYROXINE SODIUM 50 MCG PO TABS
50.0000 ug | ORAL_TABLET | Freq: Every day | ORAL | Status: DC
  Administered 2018-11-02: 50 ug via ORAL
  Filled 2018-11-01: qty 1

## 2018-11-01 NOTE — ED Notes (Signed)
ED TO INPATIENT HANDOFF REPORT  ED Nurse Name and Phone #: [phone]  S Name/Age/Gender Brenda Meyer 58 y.o. female Room/Bed: 037C/037C  Code Status   Code Status: Not on file  Home/SNF/Other Home Patient oriented to: self, place, time and situation Is this baseline? Yes   Triage Complete: Triage complete  Chief Complaint Abd Extension  Triage Note Pt arrives to ED from work with complaints of abdominal distention since Monday. Pt has not had a bowel movement in a week. Pt states she has pain at her cecum.    Allergies Allergies  Allergen Reactions  . Ace Inhibitors Swelling    Level of Care/Admitting Diagnosis ED Disposition    ED Disposition Condition Comment   Admit  Hospital Area: [facility] [[id number]]  Level of Care: Med-Surg [16]  I expect the patient will be discharged within 24 hours: No (not a candidate for 5C-Observation unit)  Diagnosis: Constipation [837290]  Admitting Physician: Sharyon Medicus, ALI Bai.Lain  Attending Physician: Sharyon Medicus, ALI Bai.Lain  PT Class (Do Not Modify): Observation [104]  PT Acc Code (Do Not Modify): Observation [10022]       B Medical/Surgery History Past Medical History:  Diagnosis Date  . Diabetes mellitus (HCC)   . Hyperlipidemia   . Hypertension   . Hypothyroidism   . OSA (obstructive sleep apnea)    Past Surgical History:  Procedure Laterality Date  . COLONOSCOPY  11/17/2011   Procedure: COLONOSCOPY;  Surgeon: Charna Elizabeth, MD;  Location: [facility];  Service: Endoscopy;  Laterality: N/A;  . SHOULDER SURGERY     right     A IV Location/Drains/Wounds Patient Lines/Drains/Airways Status   Active Line/Drains/Airways    Name:   Placement date:   Placement time:   Site:   Days:   Peripheral IV 11/01/18 Right Antecubital   11/01/18    1108    Antecubital   less than 1          Intake/Output Last 24 hours  Intake/Output Summary (Last 24 hours) at 11/01/2018 1352 Last data filed at 11/01/2018 1340 Gross  per 24 hour  Intake 1000 ml  Output -  Net 1000 ml    Labs/Imaging Results for orders placed or performed during the hospital encounter of 11/01/18 (from the past 48 hour(s))  CBC with Differential     Status: Abnormal   Collection Time: 11/01/18 11:07 AM  Result Value Ref Range   WBC 9.2 4.0 - 10.5 K/uL   RBC 4.13 3.87 - 5.11 MIL/uL   Hemoglobin 11.6 (L) 12.0 - 15.0 g/dL   HCT 21.1 15.5 - 20.8 %   MCV 90.8 80.0 - 100.0 fL   MCH 28.1 26.0 - 34.0 pg   MCHC 30.9 30.0 - 36.0 g/dL   RDW 02.2 33.6 - 12.2 %   Platelets 250 150 - 400 K/uL   nRBC 0.0 0.0 - 0.2 %   Neutrophils Relative % 67 %   Neutro Abs 6.2 1.7 - 7.7 K/uL   Lymphocytes Relative 24 %   Lymphs Abs 2.2 0.7 - 4.0 K/uL   Monocytes Relative 6 %   Monocytes Absolute 0.5 0.1 - 1.0 K/uL   Eosinophils Relative 2 %   Eosinophils Absolute 0.2 0.0 - 0.5 K/uL   Basophils Relative 0 %   Basophils Absolute 0.0 0.0 - 0.1 K/uL   Immature Granulocytes 1 %   Abs Immature Granulocytes 0.07 0.00 - 0.07 K/uL    Comment: Performed at [facility], [address], [place], [place] [address]  Comprehensive metabolic panel     Status: Abnormal   Collection Time: 11/01/18 11:07 AM  Result Value Ref Range   Sodium 136 135 - 145 mmol/L   Potassium 4.0 3.5 - 5.1 mmol/L   Chloride 103 98 - 111 mmol/L   CO2 24 22 - 32 mmol/L   Glucose, Bld 139 (H) 70 - 99 mg/dL   BUN 7 6 - 20 mg/dL   Creatinine, Ser 9.47 0.44 - 1.00 mg/dL   Calcium 9.3 8.9 - 09.6 mg/dL   Total Protein 6.6 6.5 - 8.1 g/dL   Albumin 3.6 3.5 - 5.0 g/dL   AST 14 (L) 15 - 41 U/L   ALT 11 0 - 44 U/L   Alkaline Phosphatase 140 (H) 38 - 126 U/L   Total Bilirubin 0.4 0.3 - 1.2 mg/dL   GFR calc non Af Amer >60 >60 mL/min   GFR calc Af Amer >60 >60 mL/min   Anion gap 9 5 - 15    Comment: Performed at [facility], [address] [place], [place] [address]  Lipase, blood     Status: None   Collection Time: 11/01/18 11:07 AM  Result Value Ref Range   Lipase 24 11 -  51 U/L    Comment: Performed at [facility], [address] [place], [place] [address]  Urinalysis, Routine w reflex microscopic     Status: Abnormal   Collection Time: 11/01/18 12:19 PM  Result Value Ref Range   Color, Urine STRAW (A) YELLOW   APPearance HAZY (A) CLEAR   Specific Gravity, Urine 1.002 (L) 1.005 - 1.030   pH 6.0 5.0 - 8.0   Glucose, UA NEGATIVE NEGATIVE mg/dL   Hgb urine dipstick SMALL (A) NEGATIVE   Bilirubin Urine NEGATIVE NEGATIVE   Ketones, ur NEGATIVE NEGATIVE mg/dL   Protein, ur NEGATIVE NEGATIVE mg/dL   Nitrite NEGATIVE NEGATIVE   Leukocytes,Ua NEGATIVE NEGATIVE   RBC / HPF 0-5 0 - 5 RBC/hpf   WBC, UA 0-5 0 - 5 WBC/hpf   Bacteria, UA MANY (A) NONE SEEN   Squamous Epithelial / LPF 0-5 0 - 5    Comment: Performed at [facility], [address] [place], [place] [address]   Dg Abd Acute W/chest  Result Date: 11/01/2018 CLINICAL DATA:  Patient reports known ileus. Abdominal distention and pain. No bowel movement for a week. EXAM: DG ABDOMEN ACUTE W/ 1V CHEST COMPARISON:  October 21, 2018 KUB.  October 25, 2018 CT scan. FINDINGS: The heart, hila, and mediastinum are normal. No pneumothorax. No nodules or masses. No focal infiltrates. No free air under the diaphragm on the upright view. No portal venous gas identified. No convincing evidence of pneumatosis. Prominence of the colon is identified from cecum into the descending colon. There is moderate to severe fecal loading. No definitive small bowel dilatation noted. IMPRESSION: Continued prominence of the colon from cecum into the distal descending colon with moderate to severe fecal loading. Electronically Signed   By: Gerome Sam III M.D   On: 11/01/2018 12:23    Pending Labs Unresulted Labs (From admission, onward)    Start     Ordered   Signed and Held  HIV antibody (Routine Testing)  Once,   R     Signed and Held   Signed and Armed forces training and education officer morning,   R     Signed and  Held  Vitals/Pain Today's Vitals   11/01/18 1230 11/01/18 1245 11/01/18 1300 11/01/18 1330  BP:      Pulse: 70 72 92 65  Resp:      Temp:      TempSrc:      SpO2: 94% 100% 100% 98%  Weight:      Height:      PainSc:        Isolation Precautions No active isolations  Medications Medications  sorbitol, milk of mag, mineral oil, glycerin (SMOG) enema (960 mLs Rectal Refused 11/01/18 1300)  sodium chloride 0.9 % bolus 1,000 mL (0 mLs Intravenous Stopped 11/01/18 1340)    Mobility walks Low fall risk   Focused Assessments gast   R Recommendations: See Admitting Provider Note  Report given to:   Additional Notes:  Has not had bowel movement for two weeks. Pt alert and oriented x4. Able to get up and walk on their own. No pain at this time.

## 2018-11-01 NOTE — Telephone Encounter (Signed)
Really nothing else we can do other than considering a repat steroid shot 3 months after the first steroid shot or eventually a knee replacement if her pain gets bad enough.

## 2018-11-01 NOTE — ED Notes (Signed)
Pt informed she needs to provide a urine sample when possible. Pt given UA cup / verbalized understanding.   

## 2018-11-01 NOTE — Consult Note (Signed)
[facility] Consultation Note  Referring Provider:  [facility] Primary Care Physician:  Junie SpencerHawks, Christy A, FNP  Reason for Consultation:  Abdominal distention, constipation  HPI: Brenda Meyer is a 58 y.o. female admitted for above reasons.  Patient denies prior history of ileus or constipation until about one month ago.  She reports not having a "normal" or significant bowel movement over the past month, other than some occasional scant liquid effluent.  She has no nausea or vomiting, but has noticed increase in abdominal girth.  Has tried miralax, dulcolax tablets and lactulose over the counter, without success. No blood in stool or unintentional weight loss.  No narcotics, new medications, recent surgery, or other clear trigger to her change in bowel habits.  She did have colonoscopy in 2013, also for change in bowel habits (but per patient different symptoms than currently), showing suboptimal prep but no obvious colonic stricture or pathology.   Past Medical History:  Diagnosis Date  . Diabetes mellitus (HCC)   . Hyperlipidemia   . Hypertension   . Hypothyroidism   . OSA (obstructive sleep apnea)     Past Surgical History:  Procedure Laterality Date  . COLONOSCOPY  11/17/2011   Procedure: COLONOSCOPY;  Surgeon: Charna ElizabethJyothi Mann, MD;  Location: [facility];  Service: Endoscopy;  Laterality: N/A;  . SHOULDER SURGERY     right    Prior to Admission medications   Medication Sig Start Date End Date Taking? Authorizing Provider  aspirin EC 81 MG tablet Take 1 tablet (81 mg total) by mouth daily. 05/21/15   Jannifer RodneyHawks, Christy A, FNP  atorvastatin (LIPITOR) 20 MG tablet TAKE 1 TABLET BY MOUTH DAILY. Patient taking differently: Take 20 mg by mouth daily at 6 PM.  10/22/18   Jannifer RodneyHawks, Christy A, FNP  Calcium Citrate-Vitamin D (CALCIUM + D PO) Take 600 mg by mouth daily.     [provider]  cyclobenzaprine (FLEXERIL) 10 MG tablet TAKE 1 TABLET BY MOUTH 3 TIMES A DAY AS  NEEDED FOR MUSCLE SPASM Patient taking differently: Take 10 mg by mouth 3 (three) times daily as needed for muscle spasms.  09/30/18   Junie SpencerHawks, Christy A, FNP  diclofenac (VOLTAREN) 75 MG EC tablet Take 1 tablet (75 mg total) by mouth 2 (two) times daily. 08/14/18   Junie SpencerHawks, Christy A, FNP  diphenhydrAMINE (BENADRYL) 25 MG tablet Take 25 mg by mouth at bedtime as needed for sleep.    [provider]  DULoxetine (CYMBALTA) 30 MG capsule TAKE 2 CAPSULES BY MOUTH DAILY. Patient taking differently: Take 60 mg by mouth daily.  09/30/18   Jannifer RodneyHawks, Christy A, FNP  escitalopram (LEXAPRO) 20 MG tablet TAKE 1 TABLET (20 MG TOTAL) BY MOUTH DAILY. 07/26/18   Jannifer RodneyHawks, Christy A, FNP  Lactulose 20 GM/30ML SOLN Take 30 mLs (20 g total) by mouth daily. 10/18/18   Jannifer RodneyHawks, Christy A, FNP  levothyroxine (SYNTHROID, LEVOTHROID) 50 MCG tablet TAKE 1 TABLET (50 MCG TOTAL) BY MOUTH DAILY. 06/17/18   Jannifer RodneyHawks, Christy A, FNP  losartan (COZAAR) 50 MG tablet TAKE 1 TABLET BY MOUTH DAILY. Patient taking differently: Take 50 mg by mouth daily.  10/22/18   Jannifer RodneyHawks, Christy A, FNP  metFORMIN (GLUCOPHAGE) 1000 MG tablet TAKE 1 TABLET BY MOUTH TWICE A DAY WITH A MEAL. Patient taking differently: Take 1,000 mg by mouth 2 (two) times daily with a meal.  08/16/18   Hawks, Neysa Bonitohristy A, FNP  metoCLOPramide (REGLAN) 10 MG tablet Take 1 tablet (10 mg total) by mouth  3 (three) times daily with meals. 10/26/18 10/26/19  Junie Spencer, FNP  Vitamin D, Ergocalciferol, (DRISDOL) 1.25 MG (50000 UT) CAPS capsule TAKE 1 CAPSULE BY MOUTH EVERY 7 DAYS. Patient taking differently: Take 50,000 Units by mouth every Sunday.  09/14/18   Junie Spencer, FNP    Current Facility-Administered Medications  Medication Dose Route Frequency Provider Last Rate Last Dose  . sorbitol, milk of mag, mineral oil, glycerin (SMOG) enema  960 mL Rectal Once Jacalyn Lefevre, MD        Allergies as of 11/01/2018 - Review Complete 11/01/2018  Allergen Reaction Noted  . Ace  inhibitors Swelling 08/14/2015    Family History  Problem Relation Age of Onset  . Heart disease Mother   . Heart disease Father   . Brain cancer Brother   . Lung cancer Sister     Social History   Socioeconomic History  . Marital status: Married    Spouse name: Not on file  . Number of children: Not on file  . Years of education: Not on file  . Highest education level: Not on file  Occupational History  . Occupation: Academic librarian: [employer]  Social Needs  . Financial resource strain: Not on file  . Food insecurity:    Worry: Not on file    Inability: Not on file  . Transportation needs:    Medical: Not on file    Non-medical: Not on file  Tobacco Use  . Smoking status: Former Smoker    Packs/day: 1.00    Years: 30.00    Pack years: 30.00    Types: Cigarettes    Last attempt to quit: 10/02/2010    Years since quitting: 8.0  . Smokeless tobacco: Never Used  Substance and Sexual Activity  . Alcohol use: No  . Drug use: Not on file  . Sexual activity: Not on file  Lifestyle  . Physical activity:    Days per week: Not on file    Minutes per session: Not on file  . Stress: Not on file  Relationships  . Social connections:    Talks on phone: Not on file    Gets together: Not on file    Attends religious service: Not on file    Active member of club or organization: Not on file    Attends meetings of clubs or organizations: Not on file    Relationship status: Not on file  . Intimate partner violence:    Fear of current or ex partner: Not on file    Emotionally abused: Not on file    Physically abused: Not on file    Forced sexual activity: Not on file  Other Topics Concern  . Not on file  Social History Narrative  . Not on file    Review of Systems: As per HPI, all others negative  Physical Exam: Vital signs in last 24 hours: Temp:  [97.7 F (36.5 C)-98.2 F (36.8 C)] 98.2 F (36.8 C) (03/02 1417) Pulse Rate:  [65-98] 68 (03/02 1417) Resp:   [17-18] 18 (03/02 1417) BP: (134-171)/(61-93) 134/83 (03/02 1417) SpO2:  [94 %-100 %] 99 % (03/02 1417) Weight:  [99.3 kg] 99.3 kg (03/02 1032)   General:   Alert, overweight,  Well-developed, well-nourished, pleasant and cooperative in NAD Head:  Normocephalic and atraumatic. Eyes:  Sclera clear, no icterus.   Conjunctiva pink. Ears:  Normal auditory acuity. Nose:  No deformity, discharge,  or lesions. Mouth:  No deformity  or lesions.  Oropharynx pink & moist. Neck:  Supple; no masses or thyromegaly. Abdomen:  Soft, moderate distention with some tympany; no peritonitis; very mild non-descript tenderness. No masses, hepatosplenomegaly or hernias noted. Normal bowel sounds, without guarding, and without rebound.     Rectal:  Normal anal sphincter tone; no palpable stool in distal rectal vault. Msk:  Symmetrical without gross deformities. Normal posture. Pulses:  Normal pulses noted. Extremities:  Without clubbing or edema. Neurologic:  Alert and  oriented x4;  grossly normal neurologically. Skin:  Intact without significant lesions or rashes. Cervical Nodes:  No significant cervical adenopathy. Psych:  Alert and cooperative. Normal mood and affect.   Lab Results: Recent Labs    11/01/18 1107  WBC 9.2  HGB 11.6*  HCT 37.5  PLT 250   BMET Recent Labs    11/01/18 1107  NA 136  K 4.0  CL 103  CO2 24  GLUCOSE 139*  BUN 7  CREATININE 0.69  CALCIUM 9.3   LFT Recent Labs    11/01/18 1107  PROT 6.6  ALBUMIN 3.6  AST 14*  ALT 11  ALKPHOS 140*  BILITOT 0.4   PT/INR No results for input(s): LABPROT, INR in the last 72 hours.  Studies/Results: Dg Abd Acute W/chest  Result Date: 11/01/2018 CLINICAL DATA:  Patient reports known ileus. Abdominal distention and pain. No bowel movement for a week. EXAM: DG ABDOMEN ACUTE W/ 1V CHEST COMPARISON:  October 21, 2018 KUB.  October 25, 2018 CT scan. FINDINGS: The heart, hila, and mediastinum are normal. No pneumothorax. No  nodules or masses. No focal infiltrates. No free air under the diaphragm on the upright view. No portal venous gas identified. No convincing evidence of pneumatosis. Prominence of the colon is identified from cecum into the descending colon. There is moderate to severe fecal loading. No definitive small bowel dilatation noted. IMPRESSION: Continued prominence of the colon from cecum into the distal descending colon with moderate to severe fecal loading. Electronically Signed   By: Gerome Sam III M.D   On: 11/01/2018 12:23    Impression:  1.  Constipation/obstipation.  No palpable rectal impaction.  Over the past one month; no clear inciting cause; no obvious mass or stricture seen on recent imaging. 2.  Abnormal imaging (constipation, dilated colon ~6-7 cm in places).  Plan:  1.  SMOG enemas alternating with dulcolax suppositories. 2.  Clear liquids. 3.  Once able to clear constipation, would likely update patient's colonoscopy during this admission. 4.  [facility] will follow.   LOS: 0 days   Kinsler Soeder M  11/01/2018, 3:17 PM  Cell [phone] If no answer or after 5 PM call [phone]

## 2018-11-01 NOTE — H&P (Signed)
[facility]                                                                                    Patient Demographics  Brenda Meyer, is a 58 y.o. female  CSN: [id number]  MRN: [id number]  DOB - Apr 06, 1961  Admit Date - 11/01/2018  Outpatient Primary MD for the patient is Junie Spencer, FNP   With History of -  Past Medical History:  Diagnosis Date  . Diabetes mellitus (HCC)   . Hyperlipidemia   . Hypertension   . Hypothyroidism   . OSA (obstructive sleep apnea)       Past Surgical History:  Procedure Laterality Date  . COLONOSCOPY  11/17/2011   Procedure: COLONOSCOPY;  Surgeon: Charna Elizabeth, MD;  Location: [facility];  Service: Endoscopy;  Laterality: N/A;  . SHOULDER SURGERY     right    in for   Chief Complaint  Patient presents with  . Abdominal Distention     HPI  Brenda Meyer  is a 58 y.o. female, with past medical history significant for diabetes mellitus hyperlipidemia presenting with constipation for the last 1 week for this episode.  She has a history of chronic ileus and has been seen in the hospital multiple times.  She has tried soap and suds enemas, MiraLAX.  Her daughter is a Engineer, civil (consulting) and helped her significantly.  She even stopped her muscle relaxant and has been following a healthy diet and taking fiber to no avail.  She is doing exercise on a stationary bike at home .  She has not been eating lately. Emergency room her potassium was normal and her KUB showed severe constipation.  Her previous abdominal surgery was a hysterectomy in 1993 .    Review of Systems    In addition to the HPI above,  No Fever-chills, No Headache, No changes with Vision or hearing, No problems swallowing food or Liquids, No Chest pain, Cough or Shortness of Breath, No Abdominal pain, No Nausea or Vommitting, Bowel movements are regular, No Blood in stool or Urine, No dysuria, No new skin rashes or bruises, No new joints pains-aches,  No new weakness,  tingling, numbness in any extremity, No recent weight gain or loss, No polyuria, polydypsia or polyphagia, No significant Mental Stressors.  A full 10 point Review of Systems was done, except as stated above, all other Review of Systems were negative.   Social History Social History   Tobacco Use  . Smoking status: Former Smoker    Packs/day: 1.00    Years: 30.00    Pack years: 30.00    Types: Cigarettes    Last attempt to quit: 10/02/2010    Years since quitting: 8.0  . Smokeless tobacco: Never Used  Substance Use Topics  . Alcohol use: No     Family History Family History  Problem Relation Age of Onset  . Heart disease Mother   . Heart disease Father   . Brain cancer Brother   . Lung cancer Sister      Prior to Admission medications   Medication Sig Start Date End Date Taking? Authorizing Provider  aspirin EC 81 MG tablet  Take 1 tablet (81 mg total) by mouth daily. 05/21/15   Jannifer Rodney A, FNP  atorvastatin (LIPITOR) 20 MG tablet TAKE 1 TABLET BY MOUTH DAILY. Patient taking differently: Take 20 mg by mouth daily at 6 PM.  10/22/18   Jannifer Rodney A, FNP  Calcium Citrate-Vitamin D (CALCIUM + D PO) Take 600 mg by mouth daily.     [provider]  cyclobenzaprine (FLEXERIL) 10 MG tablet TAKE 1 TABLET BY MOUTH 3 TIMES A DAY AS NEEDED FOR MUSCLE SPASM Patient taking differently: Take 10 mg by mouth 3 (three) times daily as needed for muscle spasms.  09/30/18   Junie Spencer, FNP  diclofenac (VOLTAREN) 75 MG EC tablet Take 1 tablet (75 mg total) by mouth 2 (two) times daily. 08/14/18   Junie Spencer, FNP  diphenhydrAMINE (BENADRYL) 25 MG tablet Take 25 mg by mouth at bedtime as needed for sleep.    [provider]  DULoxetine (CYMBALTA) 30 MG capsule TAKE 2 CAPSULES BY MOUTH DAILY. Patient taking differently: Take 60 mg by mouth daily.  09/30/18   Jannifer Rodney A, FNP  escitalopram (LEXAPRO) 20 MG tablet TAKE 1 TABLET (20 MG TOTAL) BY MOUTH DAILY.  07/26/18   Jannifer Rodney A, FNP  Lactulose 20 GM/30ML SOLN Take 30 mLs (20 g total) by mouth daily. 10/18/18   Jannifer Rodney A, FNP  levothyroxine (SYNTHROID, LEVOTHROID) 50 MCG tablet TAKE 1 TABLET (50 MCG TOTAL) BY MOUTH DAILY. 06/17/18   Jannifer Rodney A, FNP  losartan (COZAAR) 50 MG tablet TAKE 1 TABLET BY MOUTH DAILY. Patient taking differently: Take 50 mg by mouth daily.  10/22/18   Jannifer Rodney A, FNP  metFORMIN (GLUCOPHAGE) 1000 MG tablet TAKE 1 TABLET BY MOUTH TWICE A DAY WITH A MEAL. Patient taking differently: Take 1,000 mg by mouth 2 (two) times daily with a meal.  08/16/18   Hawks, Neysa Bonito A, FNP  metoCLOPramide (REGLAN) 10 MG tablet Take 1 tablet (10 mg total) by mouth 3 (three) times daily with meals. 10/26/18 10/26/19  Junie Spencer, FNP  Vitamin D, Ergocalciferol, (DRISDOL) 1.25 MG (50000 UT) CAPS capsule TAKE 1 CAPSULE BY MOUTH EVERY 7 DAYS. Patient taking differently: Take 50,000 Units by mouth every Sunday.  09/14/18   Junie Spencer, FNP    Allergies  Allergen Reactions  . Ace Inhibitors Swelling    Physical Exam  Vitals  Blood pressure 136/72, pulse 82, temperature 97.7 F (36.5 C), temperature source Oral, resp. rate 17, height  (1.702 m), weight 99.3 kg, SpO2 98 %.   1. General well-developed well-nourished female, extremely pleasant  2. Normal affect and insight, Not Suicidal or Homicidal, Awake Alert, Oriented X 3.  3. No F.N deficits, ALL C.Nerves Intact, Strength 5/5 all 4 extremities, Sensation intact all 4 extremities, Plantars down going.  4. Ears and Eyes appear Normal, Conjunctivae clear, PERRLA. Moist Oral Mucosa.  5. Supple Neck, No JVD, No cervical lymphadenopathy appriciated, No Carotid Bruits.  6. Symmetrical Chest wall movement, Good air movement bilaterally, CTAB.  7. RRR, No Gallops, Rubs or Murmurs, No Parasternal Heave.  8. Positive Bowel Sounds, protuberant belly, nontender.  9.  No Cyanosis, Normal Skin Turgor, No Skin  Rash or Bruise.  10. Good muscle tone,  joints appear normal , no effusions, Normal ROM.    Data Review  CBC Recent Labs  Lab 11/01/18 1107  WBC 9.2  HGB 11.6*  HCT 37.5  PLT 250  MCV 90.8  MCH 28.1  MCHC 30.9  RDW 13.7  LYMPHSABS 2.2  MONOABS 0.5  EOSABS 0.2  BASOSABS 0.0   ------------------------------------------------------------------------------------------------------------------  Chemistries  Recent Labs  Lab 11/01/18 1107  NA 136  K 4.0  CL 103  CO2 24  GLUCOSE 139*  BUN 7  CREATININE 0.69  CALCIUM 9.3  AST 14*  ALT 11  ALKPHOS 140*  BILITOT 0.4   ------------------------------------------------------------------------------------------------------------------ estimated creatinine clearance is 91.7 mL/min (by C-G formula based on SCr of 0.69 mg/dL). ------------------------------------------------------------------------------------------------------------------ No results for input(s): TSH, T4TOTAL, T3FREE, THYROIDAB in the last 72 hours.  Invalid input(s): FREET3   Coagulation profile No results for input(s): INR, PROTIME in the last 168 hours. ------------------------------------------------------------------------------------------------------------------- No results for input(s): DDIMER in the last 72 hours. -------------------------------------------------------------------------------------------------------------------  Cardiac Enzymes No results for input(s): CKMB, TROPONINI, MYOGLOBIN in the last 168 hours.  Invalid input(s): CK ------------------------------------------------------------------------------------------------------------------ Invalid input(s): POCBNP   ---------------------------------------------------------------------------------------------------------------  Urinalysis    Component Value Date/Time   COLORURINE STRAW (A) 11/01/2018 1219   APPEARANCEUR HAZY (A) 11/01/2018 1219   LABSPEC 1.002 (L)  11/01/2018 1219   PHURINE 6.0 11/01/2018 1219   GLUCOSEU NEGATIVE 11/01/2018 1219   HGBUR SMALL (A) 11/01/2018 1219   BILIRUBINUR NEGATIVE 11/01/2018 1219   KETONESUR NEGATIVE 11/01/2018 1219   PROTEINUR NEGATIVE 11/01/2018 1219   UROBILINOGEN 0.2 12/25/2009 1033   NITRITE NEGATIVE 11/01/2018 1219   LEUKOCYTESUR NEGATIVE 11/01/2018 1219    ----------------------------------------------------------------------------------------------------------------   Imaging results:   Dg Abd 1 View  Result Date: 10/21/2018 CLINICAL DATA:  Abdominal pain and bloating. EXAM: ABDOMEN - 1 VIEW COMPARISON:  None. FINDINGS: Air distended bowel with air-fluid levels. Most of this appears to be the colon. Findings suggest colonic ileus or low colonic obstruction. I do not see any obvious air or stool in the rectum. CT may be helpful for further evaluation. IMPRESSION: Air distended bowel with air-fluid levels, mainly in the colon. Possible ileus or low colonic obstruction. Electronically Signed   By: Rudie Meyer M.D.   On: 10/21/2018 12:22   Ct Abdomen Pelvis W Contrast  Result Date: 10/25/2018 CLINICAL DATA:  Abdominal pain and distention. Constipation for 2 days. EXAM: CT ABDOMEN AND PELVIS WITH CONTRAST TECHNIQUE: Multidetector CT imaging of the abdomen and pelvis was performed using the standard protocol following bolus administration of intravenous contrast. CONTRAST:  OMNIPAQUE IOHEXOL 300 MG/ML  SOLN COMPARISON:  KUB October 21, 2018 FINDINGS: Lower chest: No acute abnormality. Hepatobiliary: Cholelithiasis is identified in a decompressed gallbladder. No liver masses identified. The portal vein is normal. Pancreas: Unremarkable. No pancreatic ductal dilatation or surrounding inflammatory changes. Spleen: Normal in size without focal abnormality. Adrenals/Urinary Tract: Adrenal glands are unremarkable. Kidneys are normal, without renal calculi, focal lesion, or hydronephrosis. Bladder is  unremarkable. Stomach/Bowel: The stomach and small bowel are normal. The colon demonstrates no obstruction. Colon is somewhat prominent in caliber from the cecum through to through the transverse colon measuring up to 7.8 cm in the cecum. No wall thickening or pericolonic stranding. The appendix is normal. Vascular/Lymphatic: There is atherosclerotic change in the nonaneurysmal aorta, iliac vessels, and femoral vessels. The proximal celiac artery is significantly narrowed best seen on sagittal image 123. More distally, the celiac artery branches are normal in caliber. The SMA is normal in caliber. No adenopathy. Reproductive: Status post hysterectomy. No adnexal masses. Other: No abdominal wall hernia or abnormality. No abdominopelvic ascites. Musculoskeletal: No acute or significant osseous findings. IMPRESSION: 1. The colon is prominent in caliber from the cecum through the transverse colon  measuring up to 7.8 cm at the cecum. No obvious obstruction is seen. No wall thickening or pericolonic fat stranding. The findings likely represent a colonic ileus. 2. Significant narrowing of the proximal celiac artery best seen on sagittal image 123. 3. Cholelithiasis. Electronically Signed   By: Gerome Sam III M.D   On: 10/25/2018 13:23   Dg Abd Acute W/chest  Result Date: 11/01/2018 CLINICAL DATA:  Patient reports known ileus. Abdominal distention and pain. No bowel movement for a week. EXAM: DG ABDOMEN ACUTE W/ 1V CHEST COMPARISON:  October 21, 2018 KUB.  October 25, 2018 CT scan. FINDINGS: The heart, hila, and mediastinum are normal. No pneumothorax. No nodules or masses. No focal infiltrates. No free air under the diaphragm on the upright view. No portal venous gas identified. No convincing evidence of pneumatosis. Prominence of the colon is identified from cecum into the descending colon. There is moderate to severe fecal loading. No definitive small bowel dilatation noted. IMPRESSION: Continued prominence of  the colon from cecum into the distal descending colon with moderate to severe fecal loading. Electronically Signed   By: Gerome Sam III M.D   On: 11/01/2018 12:23      Assessment & Plan  Constipation, severe Patient has been on multiple medications and has a history of ileus Patient has tried soap and sounds and MiraLAX.  Her daughter is a Engineer, civil (consulting). We will start soapsuds enema Consult GI  Diabetes mellitus type 2 ISS continue with metformin  Hyperlipidemia Continue with Lipitor  Anxiety/depression Continue with Cymbalta/Lexapro    DVT Prophylaxis Lovenox  AM Labs Ordered, also please review Full Orders    Code Status full Disposition Plan: Home  Time spent in minutes : 32 minutes  Condition fair   @

## 2018-11-01 NOTE — ED Triage Notes (Signed)
Pt arrives to ED from work with complaints of abdominal distention since Monday. Pt has not had a bowel movement in a week. Pt states she has pain at her cecum.

## 2018-11-01 NOTE — Progress Notes (Signed)
New Admission Note:   Arrival Method: WC Mental Orientation: A&OX4 Telemetry: Not Ordered Assessment: Completed Skin: WDL IV: WDL Pain: Denies Safety Measures: Safety Fall Prevention Plan has been given, discussed and signed Admission: Completed Unit Orientation: Patient has been orientated to the room, unit and staff.   Orders have been reviewed and implemented. Will continue to monitor the patient. Call light has been placed within reach and bed alarm has been activated.    Selina Cooley BSN, RN3

## 2018-11-01 NOTE — ED Provider Notes (Signed)
[facility] EMERGENCY DEPARTMENT Provider Note   CSN: [id number] Arrival date & time: 11/01/18  1023    History   Chief Complaint Chief Complaint  Patient presents with  . Abdominal Distention    HPI Harjot P Bellew is a 58 y.o. female.     Pt presents to the ED today with abdominal distention.  The pt has been diagnosed with a colonic ileus and she is still unable to have a bowel movement.  She said her abdomen is swelling up.  She has tried several meds and an enema without relief.  She was seen here on 2/24 and had a work up including CT.  She said she's not been able to eat much as she gets nauseous.     Past Medical History:  Diagnosis Date  . Diabetes mellitus (HCC)   . Hyperlipidemia   . Hypertension   . Hypothyroidism   . OSA (obstructive sleep apnea)     Patient Active Problem List   Diagnosis Date Noted  . Hx of smoking 11/12/2016  . Atherosclerosis of aorta (HCC) 11/12/2016  . Anxiety and depression 08/28/2016  . Obesity (BMI 30-39.9) 07/29/2016  . Essential hypertension 06/12/2015  . Vitamin D deficiency 12/11/2014  . Back pain 12/08/2014  . Hypothyroidism 12/08/2014  . Hyperlipidemia 12/08/2014  . Diabetes mellitus, type 2 (HCC) 12/08/2014  . OSA (obstructive sleep apnea) 01/21/2011    Past Surgical History:  Procedure Laterality Date  . COLONOSCOPY  11/17/2011   Procedure: COLONOSCOPY;  Surgeon: Charna Elizabeth, MD;  Location: [facility];  Service: Endoscopy;  Laterality: N/A;  . SHOULDER SURGERY     right     OB History   No obstetric history on file.      Home Medications    Prior to Admission medications   Medication Sig Start Date End Date Taking? Authorizing Provider  aspirin EC 81 MG tablet Take 1 tablet (81 mg total) by mouth daily. 05/21/15   Jannifer Rodney A, FNP  atorvastatin (LIPITOR) 20 MG tablet TAKE 1 TABLET BY MOUTH DAILY. Patient taking differently: Take 20 mg by mouth daily at 6 PM.  10/22/18   Jannifer Rodney A, FNP  Calcium Citrate-Vitamin D (CALCIUM + D PO) Take 600 mg by mouth daily.     [provider]  cyclobenzaprine (FLEXERIL) 10 MG tablet TAKE 1 TABLET BY MOUTH 3 TIMES A DAY AS NEEDED FOR MUSCLE SPASM Patient taking differently: Take 10 mg by mouth 3 (three) times daily as needed for muscle spasms.  09/30/18   Junie Spencer, FNP  diclofenac (VOLTAREN) 75 MG EC tablet Take 1 tablet (75 mg total) by mouth 2 (two) times daily. 08/14/18   Junie Spencer, FNP  diphenhydrAMINE (BENADRYL) 25 MG tablet Take 25 mg by mouth at bedtime as needed for sleep.    [provider]  DULoxetine (CYMBALTA) 30 MG capsule TAKE 2 CAPSULES BY MOUTH DAILY. Patient taking differently: Take 60 mg by mouth daily.  09/30/18   Jannifer Rodney A, FNP  escitalopram (LEXAPRO) 20 MG tablet TAKE 1 TABLET (20 MG TOTAL) BY MOUTH DAILY. 07/26/18   Jannifer Rodney A, FNP  Lactulose 20 GM/30ML SOLN Take 30 mLs (20 g total) by mouth daily. 10/18/18   Jannifer Rodney A, FNP  levothyroxine (SYNTHROID, LEVOTHROID) 50 MCG tablet TAKE 1 TABLET (50 MCG TOTAL) BY MOUTH DAILY. 06/17/18   Jannifer Rodney A, FNP  losartan (COZAAR) 50 MG tablet TAKE 1 TABLET BY MOUTH DAILY. Patient taking  differently: Take 50 mg by mouth daily.  10/22/18   Jannifer Rodney A, FNP  metFORMIN (GLUCOPHAGE) 1000 MG tablet TAKE 1 TABLET BY MOUTH TWICE A DAY WITH A MEAL. Patient taking differently: Take 1,000 mg by mouth 2 (two) times daily with a meal.  08/16/18   Hawks, Neysa Bonito A, FNP  metoCLOPramide (REGLAN) 10 MG tablet Take 1 tablet (10 mg total) by mouth 3 (three) times daily with meals. 10/26/18 10/26/19  Junie Spencer, FNP  Vitamin D, Ergocalciferol, (DRISDOL) 1.25 MG (50000 UT) CAPS capsule TAKE 1 CAPSULE BY MOUTH EVERY 7 DAYS. Patient taking differently: Take 50,000 Units by mouth every Sunday.  09/14/18   Junie Spencer, FNP    Family History Family History  Problem Relation Age of Onset  . Heart disease Mother   . Heart disease  Father   . Brain cancer Brother   . Lung cancer Sister     Social History Social History   Tobacco Use  . Smoking status: Former Smoker    Packs/day: 1.00    Years: 30.00    Pack years: 30.00    Types: Cigarettes    Last attempt to quit: 10/02/2010    Years since quitting: 8.0  . Smokeless tobacco: Never Used  Substance Use Topics  . Alcohol use: No  . Drug use: Not on file     Allergies   Ace inhibitors   Review of Systems Review of Systems  Gastrointestinal: Positive for abdominal distention, abdominal pain, constipation and nausea.  All other systems reviewed and are negative.    Physical Exam Updated Vital Signs BP 136/72   Pulse 82   Temp 97.7 F (36.5 C) (Oral)   Resp 17   Ht  (1.702 m)   Wt 99.3 kg   SpO2 98%   BMI 34.30 kg/m   Physical Exam Vitals signs and nursing note reviewed.  Constitutional:      Appearance: Normal appearance.  HENT:     Head: Normocephalic and atraumatic.     Right Ear: External ear normal.     Left Ear: External ear normal.     Nose: Nose normal.     Mouth/Throat:     Mouth: Mucous membranes are dry.  Eyes:     Extraocular Movements: Extraocular movements intact.     Pupils: Pupils are equal, round, and reactive to light.  Neck:     Musculoskeletal: Normal range of motion and neck supple.  Cardiovascular:     Rate and Rhythm: Normal rate and regular rhythm.     Pulses: Normal pulses.     Heart sounds: Normal heart sounds.  Pulmonary:     Effort: Pulmonary effort is normal.     Breath sounds: Normal breath sounds.  Abdominal:     General: Bowel sounds are decreased. There is distension.     Tenderness: There is abdominal tenderness.  Musculoskeletal: Normal range of motion.  Skin:    General: Skin is warm.     Capillary Refill: Capillary refill takes less than 2 seconds.  Neurological:     General: No focal deficit present.     Mental Status: She is alert and oriented to person, place, and time.    Psychiatric:        Mood and Affect: Mood normal.        Behavior: Behavior normal.      ED Treatments / Results  Labs (all labs ordered are listed, but only abnormal results are displayed) Labs Reviewed  CBC WITH DIFFERENTIAL/PLATELET - Abnormal; Notable for the following components:      Result Value   Hemoglobin 11.6 (*)    All other components within normal limits  COMPREHENSIVE METABOLIC PANEL - Abnormal; Notable for the following components:   Glucose, Bld 139 (*)    AST 14 (*)    Alkaline Phosphatase 140 (*)    All other components within normal limits  URINALYSIS, ROUTINE W REFLEX MICROSCOPIC - Abnormal; Notable for the following components:   Color, Urine STRAW (*)    APPearance HAZY (*)    Specific Gravity, Urine 1.002 (*)    Hgb urine dipstick SMALL (*)    Bacteria, UA MANY (*)    All other components within normal limits  LIPASE, BLOOD    EKG None  Radiology Dg Abd Acute W/chest  Result Date: 11/01/2018 CLINICAL DATA:  Patient reports known ileus. Abdominal distention and pain. No bowel movement for a week. EXAM: DG ABDOMEN ACUTE W/ 1V CHEST COMPARISON:  October 21, 2018 KUB.  October 25, 2018 CT scan. FINDINGS: The heart, hila, and mediastinum are normal. No pneumothorax. No nodules or masses. No focal infiltrates. No free air under the diaphragm on the upright view. No portal venous gas identified. No convincing evidence of pneumatosis. Prominence of the colon is identified from cecum into the descending colon. There is moderate to severe fecal loading. No definitive small bowel dilatation noted. IMPRESSION: Continued prominence of the colon from cecum into the distal descending colon with moderate to severe fecal loading. Electronically Signed   By: Gerome Sam III M.D   On: 11/01/2018 12:23    Procedures Procedures (including critical care time)  Medications Ordered in ED Medications  sorbitol, milk of mag, mineral oil, glycerin (SMOG) enema (has no  administration in time range)  sodium chloride 0.9 % bolus 1,000 mL (1,000 mLs Intravenous New Bag/Given 11/01/18 1111)     Initial Impression / Assessment and Plan / ED Course  I have reviewed the triage vital signs and the nursing notes.  Pertinent labs & imaging results that were available during my care of the patient were reviewed by me and considered in my medical decision making (see chart for details).     Pt's ileus is not resolving on its own at home.  She's been trying several different laxatives and enemas.  She is now unable to eat or drink much.    Pt d/w Dr. Sharyon Medicus ([facility]) for admission.  Final Clinical Impressions(s) / ED Diagnoses   Final diagnoses:  Constipation, unspecified constipation type  Ileus [facility])  Dehydration    ED Discharge Orders    None       Jacalyn Lefevre, MD 11/01/18 1327

## 2018-11-02 ENCOUNTER — Observation Stay (HOSPITAL_COMMUNITY): Payer: No Typology Code available for payment source

## 2018-11-02 DIAGNOSIS — K5900 Constipation, unspecified: Secondary | ICD-10-CM

## 2018-11-02 DIAGNOSIS — F419 Anxiety disorder, unspecified: Secondary | ICD-10-CM | POA: Diagnosis not present

## 2018-11-02 DIAGNOSIS — F329 Major depressive disorder, single episode, unspecified: Secondary | ICD-10-CM | POA: Diagnosis not present

## 2018-11-02 DIAGNOSIS — E119 Type 2 diabetes mellitus without complications: Secondary | ICD-10-CM

## 2018-11-02 LAB — BASIC METABOLIC PANEL
Anion gap: 5 (ref 5–15)
BUN: <5 mg/dL — ABNORMAL LOW (ref 6–20)
CO2: 28 mmol/L (ref 22–32)
Calcium: 8.9 mg/dL (ref 8.9–10.3)
Chloride: 105 mmol/L (ref 98–111)
Creatinine, Ser: 0.78 mg/dL (ref 0.44–1.00)
GFR calc Af Amer: >60 mL/min (ref >60)
GFR calc non Af Amer: >60 mL/min (ref >60)
Glucose, Bld: 115 mg/dL — ABNORMAL HIGH (ref 70–99)
Potassium: 4 mmol/L (ref 3.5–5.1)
Sodium: 138 mmol/L (ref 135–145)

## 2018-11-02 LAB — GLUCOSE, CAPILLARY
Glucose-Capillary: 90 mg/dL (ref 70–99)
Glucose-Capillary: 84 mg/dL (ref 70–99)

## 2018-11-02 LAB — HIV ANTIBODY (ROUTINE TESTING W REFLEX): HIV Screen 4th Generation wRfx: NONREACTIVE

## 2018-11-02 MED ORDER — POLYETHYLENE GLYCOL 3350 17 G PO PACK
17.0000 g | PACK | Freq: Two times a day (BID) | ORAL | Status: DC
  Administered 2018-11-02: 17 g via ORAL
  Filled 2018-11-02: qty 1

## 2018-11-02 NOTE — Discharge Summary (Signed)
Physician Discharge Summary  Brenda Meyer AUQ:[id number] DOB: 06-Apr-1961 DOA: 11/01/2018  PCP: Junie Spencer, FNP  Admit date: 11/01/2018 Discharge date: 11/02/2018  Admitted From: home Disposition:  Home  Recommendations for Outpatient Follow-up:  1. She will follow-up with GI as an outpatient and will need ar colonoscopy  Home Health:No Equipment/Devices:None  Discharge Condition:Stable CODE STATUS:Full Diet recommendation: Heart Healthy   Brief/Interim Summary:  58 y.o. female past medical history diabetes mellitus hyperlipidemia chronic ileus with multiple admission for the same comes in as a they have tried Celexa and MiraLAX at home, but has not been able to had a bowel movement. Discharge Diagnoses:  Active Problems:   Hyperlipidemia   Diabetes mellitus, type 2 (HCC)   Essential hypertension   Anxiety and depression   Constipation Severe constipation: She was started on small Dulcolax and MiraLAX GI was consulted she had several bowel movements. Repeated abdominal x-ray showed decrease in stool burden.   Diabetes mellitus, type 2 (HCC) Continue current home regimen no changes made.    Essential hypertension Blood pressure is fairly controlled without any antihypertensive medication will continue to monitor. Follow up With PCP as an outpatient.  Anxiety and depression Cont. Current home regimen.  Discharge Instructions  Discharge Instructions    Diet - low sodium heart healthy   Complete by:  As directed    Increase activity slowly   Complete by:  As directed      Allergies as of 11/02/2018      Reactions   Ace Inhibitors Swelling      Medication List    STOP taking these medications   diclofenac 75 MG EC tablet Commonly known as:  VOLTAREN     TAKE these medications   aspirin EC 81 MG tablet Take 1 tablet (81 mg total) by mouth daily.   atorvastatin 20 MG tablet Commonly known as:  LIPITOR TAKE 1 TABLET BY MOUTH DAILY. What changed:  when to  take this   CALCIUM + D PO Take 600 mg by mouth 2 (two) times daily.   cyclobenzaprine 10 MG tablet Commonly known as:  FLEXERIL TAKE 1 TABLET BY MOUTH 3 TIMES A DAY AS NEEDED FOR MUSCLE SPASM What changed:  See the new instructions.   diphenhydrAMINE 25 MG tablet Commonly known as:  BENADRYL Take 25 mg by mouth at bedtime as needed for sleep.   DULoxetine 30 MG capsule Commonly known as:  CYMBALTA TAKE 2 CAPSULES BY MOUTH DAILY.   escitalopram 20 MG tablet Commonly known as:  LEXAPRO TAKE 1 TABLET (20 MG TOTAL) BY MOUTH DAILY. What changed:  when to take this   Lactulose 20 GM/30ML Soln Take 30 mLs (20 g total) by mouth daily.   levothyroxine 50 MCG tablet Commonly known as:  SYNTHROID, LEVOTHROID TAKE 1 TABLET (50 MCG TOTAL) BY MOUTH DAILY.   losartan 50 MG tablet Commonly known as:  COZAAR TAKE 1 TABLET BY MOUTH DAILY.   metFORMIN 1000 MG tablet Commonly known as:  GLUCOPHAGE TAKE 1 TABLET BY MOUTH TWICE A DAY WITH A MEAL. What changed:  See the new instructions.   metoCLOPramide 10 MG tablet Commonly known as:  REGLAN Take 1 tablet (10 mg total) by mouth 3 (three) times daily with meals.   Vitamin D (Ergocalciferol) 1.25 MG (50000 UT) Caps capsule Commonly known as:  DRISDOL TAKE 1 CAPSULE BY MOUTH EVERY 7 DAYS. What changed:  See the new instructions.       Allergies  Allergen Reactions  .  Ace Inhibitors Swelling    Consultations:  *GI   Procedures/Studies: Dg Abd 1 View  Result Date: 10/21/2018 CLINICAL DATA:  Abdominal pain and bloating. EXAM: ABDOMEN - 1 VIEW COMPARISON:  None. FINDINGS: Air distended bowel with air-fluid levels. Most of this appears to be the colon. Findings suggest colonic ileus or low colonic obstruction. I do not see any obvious air or stool in the rectum. CT may be helpful for further evaluation. IMPRESSION: Air distended bowel with air-fluid levels, mainly in the colon. Possible ileus or low colonic obstruction.  Electronically Signed   By: Rudie Meyer M.D.   On: 10/21/2018 12:22   Ct Abdomen Pelvis W Contrast  Result Date: 10/25/2018 CLINICAL DATA:  Abdominal pain and distention. Constipation for 2 days. EXAM: CT ABDOMEN AND PELVIS WITH CONTRAST TECHNIQUE: Multidetector CT imaging of the abdomen and pelvis was performed using the standard protocol following bolus administration of intravenous contrast. CONTRAST:  OMNIPAQUE IOHEXOL 300 MG/ML  SOLN COMPARISON:  KUB October 21, 2018 FINDINGS: Lower chest: No acute abnormality. Hepatobiliary: Cholelithiasis is identified in a decompressed gallbladder. No liver masses identified. The portal vein is normal. Pancreas: Unremarkable. No pancreatic ductal dilatation or surrounding inflammatory changes. Spleen: Normal in size without focal abnormality. Adrenals/Urinary Tract: Adrenal glands are unremarkable. Kidneys are normal, without renal calculi, focal lesion, or hydronephrosis. Bladder is unremarkable. Stomach/Bowel: The stomach and small bowel are normal. The colon demonstrates no obstruction. Colon is somewhat prominent in caliber from the cecum through to through the transverse colon measuring up to 7.8 cm in the cecum. No wall thickening or pericolonic stranding. The appendix is normal. Vascular/Lymphatic: There is atherosclerotic change in the nonaneurysmal aorta, iliac vessels, and femoral vessels. The proximal celiac artery is significantly narrowed best seen on sagittal image 123. More distally, the celiac artery branches are normal in caliber. The SMA is normal in caliber. No adenopathy. Reproductive: Status post hysterectomy. No adnexal masses. Other: No abdominal wall hernia or abnormality. No abdominopelvic ascites. Musculoskeletal: No acute or significant osseous findings. IMPRESSION: 1. The colon is prominent in caliber from the cecum through the transverse colon measuring up to 7.8 cm at the cecum. No obvious obstruction is seen. No wall thickening or  pericolonic fat stranding. The findings likely represent a colonic ileus. 2. Significant narrowing of the proximal celiac artery best seen on sagittal image 123. 3. Cholelithiasis. Electronically Signed   By: Gerome Sam III M.D   On: 10/25/2018 13:23   Dg Abd Acute W/chest  Result Date: 11/01/2018 CLINICAL DATA:  Patient reports known ileus. Abdominal distention and pain. No bowel movement for a week. EXAM: DG ABDOMEN ACUTE W/ 1V CHEST COMPARISON:  October 21, 2018 KUB.  October 25, 2018 CT scan. FINDINGS: The heart, hila, and mediastinum are normal. No pneumothorax. No nodules or masses. No focal infiltrates. No free air under the diaphragm on the upright view. No portal venous gas identified. No convincing evidence of pneumatosis. Prominence of the colon is identified from cecum into the descending colon. There is moderate to severe fecal loading. No definitive small bowel dilatation noted. IMPRESSION: Continued prominence of the colon from cecum into the distal descending colon with moderate to severe fecal loading. Electronically Signed   By: Gerome Sam III M.D   On: 11/01/2018 12:23   (Echo, Carotid, EGD, Colonoscopy, ERCP)    Subjective: Complaints feels great. Discharge Exam: Vitals:   11/01/18 1417 11/02/18 0332  BP: 134/83 (!) 117/59  Pulse: 68 76  Resp: 18 16  Temp: 98.2 F (36.8 C) 98.2 F (36.8 C)  SpO2: 99% 96%     General: Pt is alert, awake, not in acute distress Cardiovascular: RRR, S1/S2 +, no rubs, no gallops Respiratory: CTA bilaterally, no wheezing, no rhonchi Abdominal: Soft, NT, ND, bowel sounds + Extremities: no edema, no cyanosis    The results of significant diagnostics from this hospitalization (including imaging, microbiology, ancillary and laboratory) are listed below for reference.     Microbiology: No results found for this or any previous visit (from the past 240 hour(s)).   Labs: BNP (last 3 results) No results for input(s): BNP in  the last 8760 hours. Basic Metabolic Panel: Recent Labs  Lab 11/01/18 1107 11/02/18 0527  NA 136 138  K 4.0 4.0  CL 103 105  CO2 24 28  GLUCOSE 139* 115*  BUN 7 <5*  CREATININE 0.69 0.78  CALCIUM 9.3 8.9   Liver Function Tests: Recent Labs  Lab 11/01/18 1107  AST 14*  ALT 11  ALKPHOS 140*  BILITOT 0.4  PROT 6.6  ALBUMIN 3.6   Recent Labs  Lab 11/01/18 1107  LIPASE 24   No results for input(s): AMMONIA in the last 168 hours. CBC: Recent Labs  Lab 11/01/18 1107  WBC 9.2  NEUTROABS 6.2  HGB 11.6*  HCT 37.5  MCV 90.8  PLT 250   Cardiac Enzymes: No results for input(s): CKTOTAL, CKMB, CKMBINDEX, TROPONINI in the last 168 hours. BNP: Invalid input(s): POCBNP CBG: Recent Labs  Lab 11/01/18 1418 11/01/18 2101  GLUCAP 74 107*   D-Dimer No results for input(s): DDIMER in the last 72 hours. Hgb A1c No results for input(s): HGBA1C in the last 72 hours. Lipid Profile No results for input(s): CHOL, HDL, LDLCALC, TRIG, CHOLHDL, LDLDIRECT in the last 72 hours. Thyroid function studies No results for input(s): TSH, T4TOTAL, T3FREE, THYROIDAB in the last 72 hours.  Invalid input(s): FREET3 Anemia work up No results for input(s): VITAMINB12, FOLATE, FERRITIN, TIBC, IRON, RETICCTPCT in the last 72 hours. Urinalysis    Component Value Date/Time   COLORURINE STRAW (A) 11/01/2018 1219   APPEARANCEUR HAZY (A) 11/01/2018 1219   LABSPEC 1.002 (L) 11/01/2018 1219   PHURINE 6.0 11/01/2018 1219   GLUCOSEU NEGATIVE 11/01/2018 1219   HGBUR SMALL (A) 11/01/2018 1219   BILIRUBINUR NEGATIVE 11/01/2018 1219   KETONESUR NEGATIVE 11/01/2018 1219   PROTEINUR NEGATIVE 11/01/2018 1219   UROBILINOGEN 0.2 12/25/2009 1033   NITRITE NEGATIVE 11/01/2018 1219   LEUKOCYTESUR NEGATIVE 11/01/2018 1219   Sepsis Labs Invalid input(s): PROCALCITONIN,  WBC,  LACTICIDVEN Microbiology No results found for this or any previous visit (from the past 240 hour(s)).   Time coordinating  discharge: 40 minutes  SIGNED:   Marinda Elk, MD  [facility]

## 2018-11-02 NOTE — Telephone Encounter (Signed)
LMOM for patient of the below message  

## 2018-11-02 NOTE — Progress Notes (Signed)
RT called to assist patient with home CPAP set up.  Patient has everything set up but machine will not power on. Plugged unit into 3 different receptables.Unplugged power cord and reinserted power cord. Machine will not power on.  Patient aware.  Offered hospital CPAP but patient declined at this time. Patient encouraged to call for Respiratory if CPAP needed during hospital stay.

## 2018-11-03 ENCOUNTER — Ambulatory Visit (INDEPENDENT_AMBULATORY_CARE_PROVIDER_SITE_OTHER): Payer: No Typology Code available for payment source | Admitting: Orthopaedic Surgery

## 2018-11-03 MED FILL — GENERLAC 10 GM/15 ML SOLN: 10 | 15 days supply | Qty: 450 | Fill #1

## 2018-11-04 DIAGNOSIS — I10 Essential (primary) hypertension: Secondary | ICD-10-CM

## 2018-11-04 MED ORDER — BLOOD PRESSURE KIT DEVI: 1.0000 | Freq: Every day | 0 refills | Status: DC

## 2018-11-04 MED ORDER — ONETOUCH ULTRASOFT LANCETS MISC: 12 refills | Status: DC

## 2018-11-04 MED ORDER — GLUCOSE BLOOD VI STRP: ORAL_STRIP | 12 refills | Status: DC

## 2018-11-04 MED ORDER — ONETOUCH VERIO W/DEVICE KIT: 1.0000 | PACK | Freq: Every day | 0 refills | Status: DC

## 2018-11-04 MED FILL — SM BLOOD PRESSURE MONITOR: 30 days supply | Qty: 1 | Fill #0

## 2018-11-04 MED FILL — FREESTYLE LITE TEST STRIP: 90 days supply | Qty: 100 | Fill #0

## 2018-11-04 MED FILL — FREESTYLE LITE METER: 30 days supply | Qty: 1 | Fill #0

## 2018-11-04 MED FILL — FREESTYLE LANCETS: 90 days supply | Qty: 100 | Fill #0

## 2018-11-20 ENCOUNTER — Other Ambulatory Visit: Payer: Self-pay | Admitting: Family

## 2018-11-20 DIAGNOSIS — F419 Anxiety disorder, unspecified: Secondary | ICD-10-CM

## 2018-11-20 DIAGNOSIS — G8929 Other chronic pain: Secondary | ICD-10-CM

## 2018-11-20 DIAGNOSIS — E119 Type 2 diabetes mellitus without complications: Secondary | ICD-10-CM

## 2018-11-20 DIAGNOSIS — M546 Pain in thoracic spine: Secondary | ICD-10-CM

## 2018-11-20 DIAGNOSIS — F329 Major depressive disorder, single episode, unspecified: Secondary | ICD-10-CM

## 2018-11-20 DIAGNOSIS — E7800 Pure hypercholesterolemia, unspecified: Secondary | ICD-10-CM

## 2018-11-20 DIAGNOSIS — E039 Hypothyroidism, unspecified: Secondary | ICD-10-CM

## 2018-11-20 DIAGNOSIS — I10 Essential (primary) hypertension: Secondary | ICD-10-CM

## 2018-11-20 DIAGNOSIS — E785 Hyperlipidemia, unspecified: Secondary | ICD-10-CM

## 2018-11-20 DIAGNOSIS — E559 Vitamin D deficiency, unspecified: Secondary | ICD-10-CM

## 2018-11-20 DIAGNOSIS — Z0289 Encounter for other administrative examinations: Secondary | ICD-10-CM

## 2018-11-20 DIAGNOSIS — F32A Depression, unspecified: Secondary | ICD-10-CM

## 2018-11-20 MED FILL — LEVOTHYROXINE 50 MCG TABLET: 50 | 90 days supply | Qty: 90 | Fill #2

## 2018-11-20 MED FILL — LOSARTAN POTASSIUM 50 MG TA: 50 | 90 days supply | Qty: 90 | Fill #1

## 2018-11-20 MED FILL — CYCLOBENZAPRINE 10 MG TAB: 10 | 30 days supply | Qty: 90 | Fill #1

## 2018-11-23 MED ORDER — LACTULOSE 20 GM/30ML PO SOLN: 20.0000 g | Freq: Every day | ORAL | 6 refills | Status: DC

## 2018-11-23 MED ORDER — ESCITALOPRAM OXALATE 20 MG PO TABS: 20.0000 mg | ORAL_TABLET | Freq: Every day | ORAL | 0 refills | Status: DC

## 2018-11-23 MED ORDER — ATORVASTATIN CALCIUM 20 MG PO TABS: 20.0000 mg | ORAL_TABLET | Freq: Every day | ORAL | 0 refills | Status: DC

## 2018-11-23 MED ORDER — LEVOTHYROXINE SODIUM 50 MCG PO TABS: 50.0000 ug | ORAL_TABLET | Freq: Every day | ORAL | 3 refills | Status: DC

## 2018-11-23 MED ORDER — DULOXETINE HCL 30 MG PO CPEP: 60.0000 mg | ORAL_CAPSULE | Freq: Every day | ORAL | 0 refills | Status: DC

## 2018-11-23 MED ORDER — METOCLOPRAMIDE HCL 10 MG PO TABS: ORAL_TABLET | ORAL | 0 refills | Status: DC

## 2018-11-23 MED ORDER — METFORMIN HCL 1000 MG PO TABS: ORAL_TABLET | ORAL | 0 refills | Status: DC

## 2018-11-23 MED ORDER — LOSARTAN POTASSIUM 50 MG PO TABS: 50.0000 mg | ORAL_TABLET | Freq: Every day | ORAL | 0 refills | Status: DC

## 2018-11-23 MED FILL — ESCITALOPRAM 20 MG TABLET: 20 | 90 days supply | Qty: 90 | Fill #0

## 2018-11-23 MED FILL — metFORMIN HCL 1000 MG TABS: 1000 | 90 days supply | Qty: 180 | Fill #0

## 2018-11-23 MED FILL — LACTULOSE 10 GM/15 ML SOLUT: 10 | 15 days supply | Qty: 450 | Fill #0

## 2018-11-23 MED FILL — METOCLOPRAMIDE 10 MG TABLET: 10 | 30 days supply | Qty: 90 | Fill #0

## 2018-11-23 MED FILL — DULoxetine HCL 30 MG CPEP: 30 | 90 days supply | Qty: 180 | Fill #0

## 2018-11-26 ENCOUNTER — Ambulatory Visit: Payer: No Typology Code available for payment source | Admitting: Family

## 2018-12-24 MED FILL — VIT D2 1.25 MG (50,000 UNIT: 1.25 MG | 84 days supply | Qty: 12 | Fill #0

## 2019-01-12 MED FILL — LINZESS 145 MCG CAPSULE: 145 | 30 days supply | Qty: 30 | Fill #0

## 2019-01-12 MED FILL — ATORVASTATIN 20 MG TABLET: 20 | 90 days supply | Qty: 90 | Fill #0

## 2019-01-14 IMAGING — DX DG CHEST 2V
2 series · 2 of 2 positions shown · non-contrast
Comparison: None.

CLINICAL DATA: Shortness of breath.  Cough.

EXAM:
CHEST  2 VIEW

[chest pa]
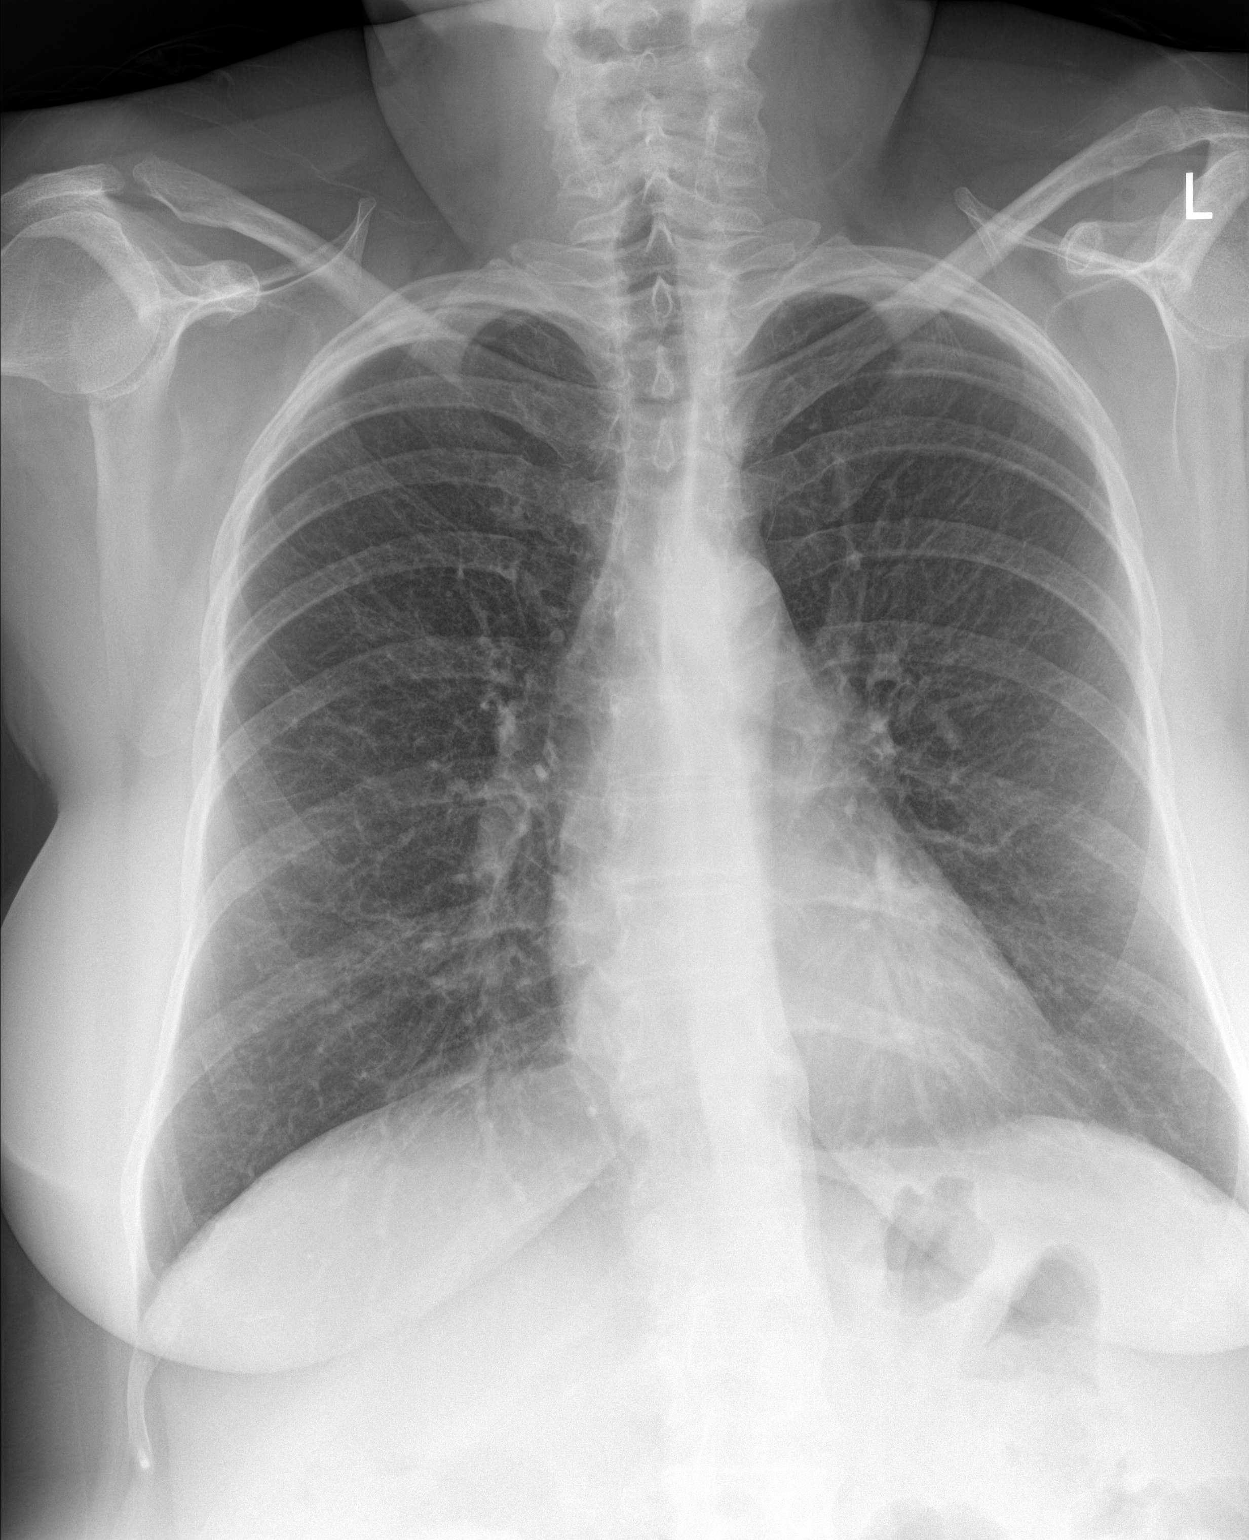

[chest lat]
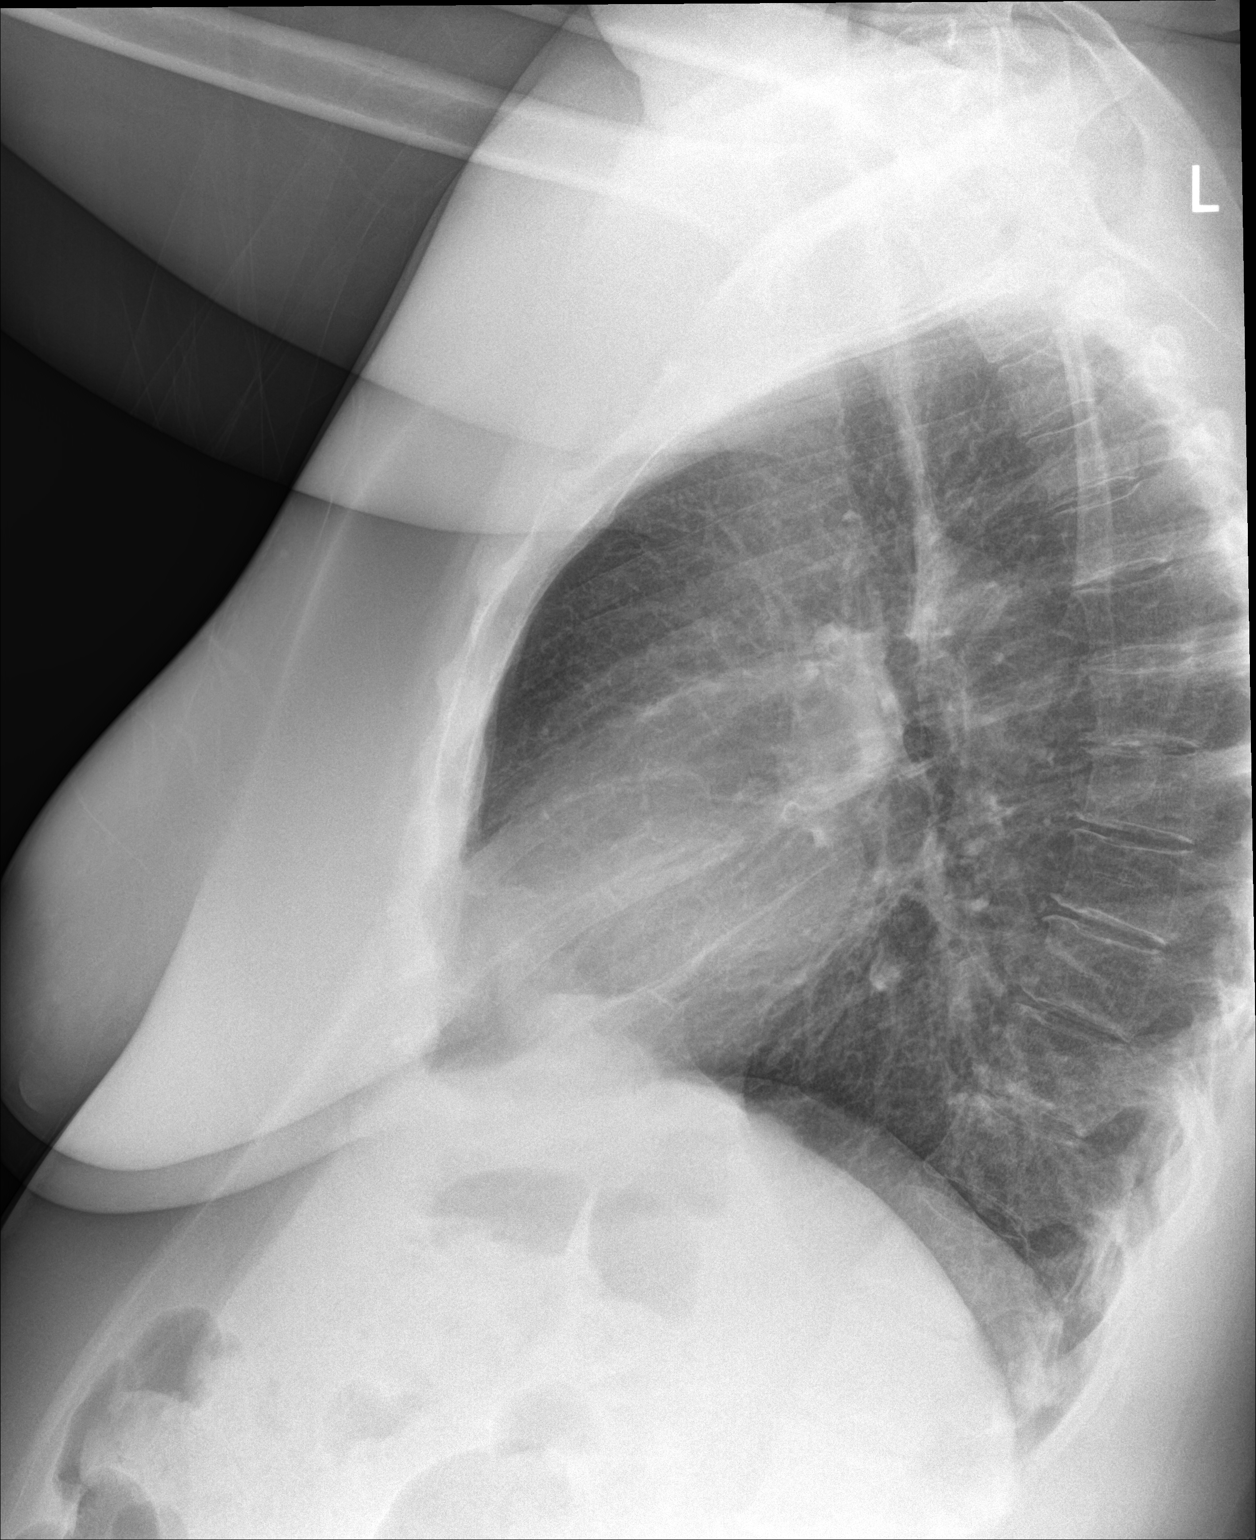

[2 of 2 positions shown; findings below may reference images not displayed]

FINDINGS: The heart size is normal. The lungs are clear. The visualized soft
tissues and bony thorax are unremarkable. Atherosclerotic
calcifications are present at the aortic arch.
IMPRESSION: 1. No acute cardiopulmonary disease.
2. Atherosclerosis of the aorta.

## 2019-02-01 ENCOUNTER — Ambulatory Visit: Payer: No Typology Code available for payment source | Admitting: Family

## 2019-02-01 MED FILL — LEVOTHYROXINE 50 MCG TABLET: 50 | 90 days supply | Qty: 90 | Fill #0

## 2019-02-02 ENCOUNTER — Encounter: Payer: Self-pay | Admitting: Family

## 2019-02-02 ENCOUNTER — Ambulatory Visit (INDEPENDENT_AMBULATORY_CARE_PROVIDER_SITE_OTHER): Payer: No Typology Code available for payment source | Admitting: Family

## 2019-02-02 ENCOUNTER — Other Ambulatory Visit: Payer: Self-pay

## 2019-02-02 DIAGNOSIS — G4733 Obstructive sleep apnea (adult) (pediatric): Secondary | ICD-10-CM

## 2019-02-02 DIAGNOSIS — F329 Major depressive disorder, single episode, unspecified: Secondary | ICD-10-CM

## 2019-02-02 DIAGNOSIS — E669 Obesity, unspecified: Secondary | ICD-10-CM | POA: Diagnosis not present

## 2019-02-02 DIAGNOSIS — F419 Anxiety disorder, unspecified: Secondary | ICD-10-CM

## 2019-02-02 DIAGNOSIS — E1169 Type 2 diabetes mellitus with other specified complication: Secondary | ICD-10-CM

## 2019-02-02 DIAGNOSIS — K5904 Chronic idiopathic constipation: Secondary | ICD-10-CM

## 2019-02-02 DIAGNOSIS — G8929 Other chronic pain: Secondary | ICD-10-CM

## 2019-02-02 DIAGNOSIS — E039 Hypothyroidism, unspecified: Secondary | ICD-10-CM | POA: Diagnosis not present

## 2019-02-02 DIAGNOSIS — E785 Hyperlipidemia, unspecified: Secondary | ICD-10-CM

## 2019-02-02 DIAGNOSIS — M546 Pain in thoracic spine: Secondary | ICD-10-CM

## 2019-02-02 DIAGNOSIS — F32A Depression, unspecified: Secondary | ICD-10-CM

## 2019-02-02 DIAGNOSIS — I10 Essential (primary) hypertension: Secondary | ICD-10-CM

## 2019-02-02 MED ORDER — CYCLOBENZAPRINE HCL 10 MG PO TABS: 10.0000 mg | ORAL_TABLET | Freq: Every day | ORAL | 2 refills | Status: DC

## 2019-02-02 MED ORDER — LINACLOTIDE 290 MCG PO CAPS: 290.0000 ug | ORAL_CAPSULE | Freq: Every day | ORAL | 1 refills | Status: DC

## 2019-02-02 MED FILL — LINZESS 290 MCG CAPSULE: 290 | 90 days supply | Qty: 90 | Fill #0

## 2019-02-02 MED FILL — CYCLOBENZAPRINE HCL 10 MG T: 10 | 90 days supply | Qty: 90 | Fill #0

## 2019-02-02 NOTE — Progress Notes (Signed)
Virtual Visit via telephone Note  I connected with Brenda Meyer on 02/02/19 at 10:19 AM by telephone and verified that I am speaking with the correct person using two identifiers. Brenda Meyer is currently located at home and no one  is currently with her during visit. The provider, Evelina Dun, FNP is located in their office at time of visit.  I discussed the limitations, risks, security and privacy concerns of performing an evaluation and management service by telephone and the availability of in person appointments. I also discussed with the patient that there may be a patient responsible charge related to this service. The patient expressed understanding and agreed to proceed.   History and Present Illness:  PT presents to the office today for chronic follow up and FMLA paperwork.  Pt had ileus in 10/2018 and still has constipation. She is scheduled for colonoscopy 02/24/19. She is having linzess, lactulose, enemas, and Doculax with no relief.  Hypertension  This is a chronic problem. The current episode started more than 1 year ago. The problem has been resolved (120/78) since onset. Associated symptoms include peripheral edema (trace). Pertinent negatives include no headaches or shortness of breath. Risk factors for coronary artery disease include dyslipidemia, diabetes mellitus, obesity and sedentary lifestyle. There is no history of CAD/MI, CVA or heart failure. Identifiable causes of hypertension include a thyroid problem.  Thyroid Problem  Presents for follow-up visit. Symptoms include constipation. Patient reports no diarrhea, dry skin or fatigue. The symptoms have been stable. Her past medical history is significant for hyperlipidemia. There is no history of heart failure.  Diabetes  She presents for her follow-up diabetic visit. She has type 2 diabetes mellitus. There are no hypoglycemic associated symptoms. Pertinent negatives for hypoglycemia include no headaches. Pertinent  negatives for diabetes include no fatigue and no foot paresthesias. Symptoms are stable. Pertinent negatives for diabetic complications include no CVA. Risk factors for coronary artery disease include dyslipidemia, diabetes mellitus, hypertension, post-menopausal and sedentary lifestyle. Her overall blood glucose range is 130-140 mg/dl. Eye exam is not current.  Constipation  This is a chronic problem. The current episode started more than 1 year ago. The problem has been waxing and waning since onset. Associated symptoms include back pain. Pertinent negatives include no diarrhea. She has tried diet changes, fiber, enemas, laxatives and stool softeners for the symptoms. The treatment provided mild relief.  Hyperlipidemia  This is a chronic problem. The current episode started more than 1 year ago. The problem is controlled. Recent lipid tests were reviewed and are normal. Exacerbating diseases include obesity. Pertinent negatives include no shortness of breath. Current antihyperlipidemic treatment includes statins. The current treatment provides moderate improvement of lipids. Risk factors for coronary artery disease include dyslipidemia, diabetes mellitus, hypertension, a sedentary lifestyle and post-menopausal.  Back Pain  This is a chronic problem. The current episode started more than 1 year ago. The problem occurs intermittently. The problem has been waxing and waning since onset. The pain is present in the lumbar spine. The quality of the pain is described as aching. The pain is moderate. Pertinent negatives include no headaches.  OSA Uses CPAP nightly. Stable    Review of Systems  Constitutional: Negative for fatigue.  Respiratory: Negative for shortness of breath.   Gastrointestinal: Positive for constipation. Negative for diarrhea.  Musculoskeletal: Positive for back pain.  Neurological: Negative for headaches.  All other systems reviewed and are negative.    Observations/Objective:  No SOB or distress noted  Assessment  and Plan: Brenda Meyer comes in today with chief complaint of No chief complaint on file.   Diagnosis and orders addressed:  1. Anxiety and depression - CMP14+EGFR; Future  2. Type 2 diabetes mellitus with other specified complication, without long-term current use of insulin (HCC) - Bayer DCA Hb A1c Waived; Future - CMP14+EGFR; Future - Microalbumin / creatinine urine ratio; Future  3. Obesity (BMI 30-39.9) - CMP14+EGFR; Future  4. Hypothyroidism, unspecified type - CMP14+EGFR; Future - TSH; Future  5. Hyperlipidemia, unspecified hyperlipidemia type - CMP14+EGFR; Future - Lipid panel; Future  6. Essential hypertension - CMP14+EGFR; Future  7. Chronic idiopathic constipation Will increase Linzess to 290 mcg from 140 mcg Keep GI appts - linaclotide (LINZESS) 290 MCG CAPS capsule; Take 1 capsule (290 mcg total) by mouth daily before breakfast.  Dispense: 90 capsule; Refill: 1 - CMP14+EGFR; Future  8. OSA (obstructive sleep apnea) - CMP14+EGFR; Future  9. Chronic midline thoracic back pain - cyclobenzaprine (FLEXERIL) 10 MG tablet; Take 1 tablet (10 mg total) by mouth at bedtime.  Dispense: 90 tablet; Refill: 2   Labs pending Health Maintenance reviewed Diet and exercise encouraged  Follow up plan: 3 months      I discussed the assessment and treatment plan with the patient. The patient was provided an opportunity to ask questions and all were answered. The patient agreed with the plan and demonstrated an understanding of the instructions.   The patient was advised to call back or seek an in-person evaluation if the symptoms worsen or if the condition fails to improve as anticipated.  The above assessment and management plan was discussed with the patient. The patient verbalized understanding of and has agreed to the management plan. Patient is aware to call the clinic if symptoms persist or worsen. Patient is aware when to  return to the clinic for a follow-up visit. Patient educated on when it is appropriate to go to the emergency department.   Time call ended: 10:36 AM   I provided 17 minutes of non-face-to-face time during this encounter.    Evelina Dun, FNP

## 2019-02-14 MED FILL — PEG-3350 SOLUTION: 420 | 1 days supply | Qty: 4000 | Fill #0

## 2019-02-16 MED FILL — metFORMIN HCL 1000 MG TABS: 1000 | 90 days supply | Qty: 180 | Fill #0

## 2019-02-18 ENCOUNTER — Other Ambulatory Visit: Payer: Self-pay | Admitting: Family

## 2019-02-18 MED ORDER — NYSTATIN 100000 UNIT/GM EX CREA: 1.0000 "application " | TOPICAL_CREAM | Freq: Two times a day (BID) | CUTANEOUS | 0 refills | Status: DC

## 2019-02-18 MED FILL — NYSTATIN 100,000 UNIT/GM CR: 100000 | 2 days supply | Qty: 30 | Fill #0

## 2019-03-01 ENCOUNTER — Other Ambulatory Visit: Payer: No Typology Code available for payment source

## 2019-03-01 ENCOUNTER — Other Ambulatory Visit: Payer: Self-pay

## 2019-03-01 DIAGNOSIS — E039 Hypothyroidism, unspecified: Secondary | ICD-10-CM

## 2019-03-01 DIAGNOSIS — E669 Obesity, unspecified: Secondary | ICD-10-CM

## 2019-03-01 DIAGNOSIS — G4733 Obstructive sleep apnea (adult) (pediatric): Secondary | ICD-10-CM

## 2019-03-01 DIAGNOSIS — F329 Major depressive disorder, single episode, unspecified: Secondary | ICD-10-CM

## 2019-03-01 DIAGNOSIS — F32A Depression, unspecified: Secondary | ICD-10-CM

## 2019-03-01 DIAGNOSIS — E1169 Type 2 diabetes mellitus with other specified complication: Secondary | ICD-10-CM

## 2019-03-01 DIAGNOSIS — K5904 Chronic idiopathic constipation: Secondary | ICD-10-CM

## 2019-03-01 DIAGNOSIS — I10 Essential (primary) hypertension: Secondary | ICD-10-CM

## 2019-03-01 DIAGNOSIS — E785 Hyperlipidemia, unspecified: Secondary | ICD-10-CM

## 2019-03-01 DIAGNOSIS — F419 Anxiety disorder, unspecified: Secondary | ICD-10-CM

## 2019-03-01 LAB — BAYER DCA HB A1C WAIVED: HB A1C (BAYER DCA - WAIVED): 7.4 % — ABNORMAL HIGH (ref <7.0)

## 2019-03-02 LAB — CMP14+EGFR
ALT: 8 IU/L (ref 0–32)
AST: 11 IU/L (ref 0–40)
Albumin/Globulin Ratio: 1.8 (ref 1.2–2.2)
Albumin: 4.2 g/dL (ref 3.8–4.9)
Alkaline Phosphatase: 163 IU/L — ABNORMAL HIGH (ref 39–117)
BUN/Creatinine Ratio: 9 (ref 9–23)
BUN: 6 mg/dL (ref 6–24)
Bilirubin Total: <0.2 mg/dL (ref 0.0–1.2)
CO2: 21 mmol/L (ref 20–29)
Calcium: 10.1 mg/dL (ref 8.7–10.2)
Chloride: 102 mmol/L (ref 96–106)
Creatinine, Ser: 0.68 mg/dL (ref 0.57–1.00)
GFR calc Af Amer: 111 mL/min/{1.73_m2} (ref >59)
GFR calc non Af Amer: 96 mL/min/{1.73_m2} (ref >59)
Globulin, Total: 2.3 g/dL (ref 1.5–4.5)
Glucose: 149 mg/dL — ABNORMAL HIGH (ref 65–99)
Potassium: 4.2 mmol/L (ref 3.5–5.2)
Sodium: 138 mmol/L (ref 134–144)
Total Protein: 6.5 g/dL (ref 6.0–8.5)

## 2019-03-02 LAB — LIPID PANEL
Chol/HDL Ratio: 3.4 ratio (ref 0.0–4.4)
Cholesterol, Total: 151 mg/dL (ref 100–199)
HDL: 45 mg/dL (ref >39)
LDL Calculated: 69 mg/dL (ref 0–99)
Triglycerides: 186 mg/dL — ABNORMAL HIGH (ref 0–149)
VLDL Cholesterol Cal: 37 mg/dL (ref 5–40)

## 2019-03-02 LAB — MICROALBUMIN / CREATININE URINE RATIO
Creatinine, Urine: 12.1 mg/dL
Microalb/Creat Ratio: <25 mg/g creat (ref 0–29)
Microalbumin, Urine: <3 ug/mL

## 2019-03-02 LAB — TSH: TSH: 3.76 u[IU]/mL (ref 0.450–4.500)

## 2019-03-03 ENCOUNTER — Other Ambulatory Visit: Payer: Self-pay | Admitting: Family

## 2019-03-03 MED ORDER — JARDIANCE 10 MG PO TABS: 10.0000 mg | ORAL_TABLET | Freq: Every day | ORAL | 1 refills | Status: DC

## 2019-03-09 MED FILL — JARDIANCE 10 MG TABLET: 10 | 90 days supply | Qty: 90 | Fill #0

## 2019-03-14 NOTE — Telephone Encounter (Signed)
Patient is active in [place] E-mail sent to patient letting her know Nothing further needed; will sign off

## 2019-03-16 ENCOUNTER — Other Ambulatory Visit: Payer: Self-pay | Admitting: Gastroenterology

## 2019-03-17 ENCOUNTER — Other Ambulatory Visit: Payer: Self-pay | Admitting: Gastroenterology

## 2019-03-24 ENCOUNTER — Other Ambulatory Visit: Payer: Self-pay | Admitting: Family

## 2019-03-24 DIAGNOSIS — E559 Vitamin D deficiency, unspecified: Secondary | ICD-10-CM

## 2019-03-24 MED FILL — DULoxetine HCL 30 MG CPEP: 30 | 90 days supply | Qty: 180 | Fill #0

## 2019-03-24 MED FILL — VIT D2 1.25 MG (50,000 UNIT: 1.25 MG | 84 days supply | Qty: 12 | Fill #0

## 2019-03-31 ENCOUNTER — Ambulatory Visit: Payer: No Typology Code available for payment source | Admitting: Pulmonary Disease

## 2019-03-31 ENCOUNTER — Encounter: Payer: Self-pay | Admitting: Pulmonary Disease

## 2019-03-31 ENCOUNTER — Other Ambulatory Visit: Payer: Self-pay

## 2019-03-31 VITALS — BP 132/78 | HR 76 | Temp 98.3°F | Ht 67.0 in | Wt 215.8 lb

## 2019-03-31 DIAGNOSIS — Z9989 Dependence on other enabling machines and devices: Secondary | ICD-10-CM | POA: Diagnosis not present

## 2019-03-31 DIAGNOSIS — G4733 Obstructive sleep apnea (adult) (pediatric): Secondary | ICD-10-CM | POA: Diagnosis not present

## 2019-03-31 NOTE — Progress Notes (Signed)
[facility]  Chief Complaint  Patient presents with  . Follow-up    F/U re: Cpap. She reports hre cpap is working well but she does need supplies.     Constitutional:  BP 132/78   Pulse 76   Temp 98.3 F (36.8 C) (Oral)   Ht _0  (1.702 m)   Wt 215 lb 12.8 oz (97.9 kg)   SpO2 97%   BMI 33.80 kg/m   Past Medical History:  Hypothyroidism, HTN, HLD, DM  Brief Summary:  Brenda Meyer is a 58 y.o. female with obstructive sleep apnea.  She uses CPAP nightly.  No issues with mask fit.  Denies sinus congestin, sore throat, dry mouth, or aerophagia.  Had an ileus recently.  Found to have colon polyps. Bowel function better.  Switched to working full time in [employer].  Likes this transition.   Physical Exam:   Appearance - well kempt   ENMT - clear nasal mucosa, midline nasal  septum, no oral exudates, no LAN, trachea midline  Respiratory - normal chest wall, normal respiratory effort, no accessory muscle use, no wheeze/rales  CV - s1s2 regular rate and rhythm, no murmurs, no peripheral edema, radial pulses symmetric  GI - soft, non tender, no masses  Lymph - no adenopathy noted in neck and axillary areas  MSK - normal gait  Ext - no cyanosis, clubbing, or joint inflammation noted  Skin - no rashes, lesions, or ulcers  Neuro - normal strength, oriented x 3  Psych - normal mood and affect   Assessment/Plan:   Obstructive sleep apnea. - she is compliant with CPAP and reports benefit - continue auto CPAP   Patient Instructions  Follow up in 1 year   Chesley Mires, MD [place] Pager: [phone] 03/31/2019, 12:39 PM  Flow Sheet    Sleep tests:  PSG 2012 >> AHI 29 Auto CPAP 02/28/19 to 03/29/19 >> used on 30 of 30 nights with average 8 hrs 50 min.  Average AHI 2.5 with median CPAP 10 and 95 th percentile CPAP 13 cm H2O  Medications:   Allergies as of 03/31/2019      Reactions   Ace  Inhibitors Swelling      Medication List       Accurate as of March 31, 2019 12:39 PM. If you have any questions, ask your nurse or doctor.        STOP taking these medications   escitalopram 20 MG tablet Commonly known as: LEXAPRO Stopped by: Chesley Mires, MD   linaclotide 290 MCG Caps capsule Commonly known as: Linzess Stopped by: Chesley Mires, MD     TAKE these medications   aspirin EC 81 MG tablet Take 1 tablet (81 mg total) by mouth daily.   atorvastatin 20 MG tablet Commonly known as: LIPITOR Take 1 tablet (20 mg total) by mouth daily.   Blood Pressure Kit Devi 1 kit by Does not apply route daily.   CALCIUM + D PO Take 600 mg by mouth 2 (two) times daily.   cyclobenzaprine 10 MG tablet Commonly known as: FLEXERIL Take 1 tablet (10 mg total) by mouth at bedtime.   diphenhydrAMINE 25 MG tablet Commonly known as: BENADRYL Take 25 mg by mouth at bedtime as needed for sleep.   DULoxetine 30 MG capsule Commonly known as: CYMBALTA Take 2 capsules (60 mg total) by mouth daily.   glucose blood test strip Commonly known as: OneTouch Verio Use as instructed  Jardiance 10 MG Tabs tablet Generic drug: empagliflozin Take 10 mg by mouth daily.   Lactulose 20 GM/30ML Soln Take 30 mLs (20 g total) by mouth daily.   levothyroxine 50 MCG tablet Commonly known as: SYNTHROID Take 1 tablet (50 mcg total) by mouth daily.   losartan 50 MG tablet Commonly known as: COZAAR Take 1 tablet (50 mg total) by mouth daily.   metFORMIN 1000 MG tablet Commonly known as: GLUCOPHAGE TAKE 1 TABLET BY MOUTH TWICE A DAY WITH A MEAL.   metoCLOPramide 10 MG tablet Commonly known as: REGLAN TAKE 1 TABLET BY MOUTH 3 TIMES DAILY WITH MEALS.   nystatin cream Commonly known as: MYCOSTATIN Apply 1 application topically 2 (two) times daily.   onetouch ultrasoft lancets Use as instructed   OneTouch Verio w/Device Kit 1 kit by Does not apply route daily.   Vitamin D  (Ergocalciferol) 1.25 MG (50000 UT) Caps capsule Commonly known as: DRISDOL TAKE 1 CAPSULE BY MOUTH EVERY 7 DAYS.       Past Surgical History:  She  has a past surgical history that includes Shoulder surgery and Colonoscopy (11/17/2011).  Family History:  Her family history includes Brain cancer in her brother; Heart disease in her father and mother; Lung cancer in her sister.  Social History:  She  reports that she quit smoking about 8 years ago. Her smoking use included cigarettes. She has a 30.00 pack-year smoking history. She has never used smokeless tobacco. She reports that she does not drink alcohol or use drugs.

## 2019-03-31 NOTE — Patient Instructions (Signed)
Follow up in 1 year.

## 2019-04-13 MED FILL — ATORVASTATIN 20 MG TABLET: 20 | 90 days supply | Qty: 90 | Fill #0

## 2019-04-14 ENCOUNTER — Other Ambulatory Visit: Payer: Self-pay | Admitting: *Deleted

## 2019-04-14 DIAGNOSIS — E7800 Pure hypercholesterolemia, unspecified: Secondary | ICD-10-CM

## 2019-04-14 DIAGNOSIS — E785 Hyperlipidemia, unspecified: Secondary | ICD-10-CM

## 2019-04-14 MED ORDER — ATORVASTATIN CALCIUM 20 MG PO TABS: 20.0000 mg | ORAL_TABLET | Freq: Every day | ORAL | 0 refills | Status: DC

## 2019-04-16 ENCOUNTER — Other Ambulatory Visit (HOSPITAL_COMMUNITY): Payer: No Typology Code available for payment source

## 2019-04-20 LAB — HM DIABETES EYE EXAM

## 2019-04-23 ENCOUNTER — Other Ambulatory Visit (HOSPITAL_COMMUNITY)
Admission: RE | Admit: 2019-04-23 | Discharge: 2019-04-23 | Disposition: A | Discharge status: Home / Self Care | Payer: No Typology Code available for payment source | Source: Ambulatory Visit | Attending: Gastroenterology | Admitting: Gastroenterology

## 2019-04-23 DIAGNOSIS — Z20828 Contact with and (suspected) exposure to other viral communicable diseases: Secondary | ICD-10-CM | POA: Insufficient documentation

## 2019-04-23 DIAGNOSIS — Z01812 Encounter for preprocedural laboratory examination: Secondary | ICD-10-CM | POA: Insufficient documentation

## 2019-04-23 LAB — SARS CORONAVIRUS 2 (TAT 6-24 HRS): SARS Coronavirus 2: NEGATIVE

## 2019-04-25 ENCOUNTER — Other Ambulatory Visit: Payer: Self-pay | Admitting: General Surgery

## 2019-04-25 DIAGNOSIS — G4733 Obstructive sleep apnea (adult) (pediatric): Secondary | ICD-10-CM

## 2019-04-25 MED FILL — LEVOTHYROXINE 50 MCG TABLET: 50 | 90 days supply | Qty: 90 | Fill #1

## 2019-04-25 MED FILL — LOSARTAN POTASSIUM 50 MG TA: 50 | 30 days supply | Qty: 30 | Fill #0

## 2019-04-25 MED FILL — LINZESS 290 MCG CAPSULE: 290 | 90 days supply | Qty: 90 | Fill #1

## 2019-04-26 MED FILL — CYCLOBENZAPRINE HCL 10 MG T: 10 | 90 days supply | Qty: 90 | Fill #1

## 2019-04-27 ENCOUNTER — Ambulatory Visit (HOSPITAL_COMMUNITY)
Admission: RE | Admit: 2019-04-27 | Discharge: 2019-04-27 | Disposition: A | Discharge status: Home / Self Care | Payer: No Typology Code available for payment source | Attending: Gastroenterology | Admitting: Gastroenterology

## 2019-04-27 ENCOUNTER — Encounter (HOSPITAL_COMMUNITY)
Admission: RE | Disposition: A | Discharge status: Home / Self Care | Payer: Self-pay | Source: Home / Self Care | Attending: Gastroenterology

## 2019-04-27 DIAGNOSIS — K5900 Constipation, unspecified: Secondary | ICD-10-CM | POA: Insufficient documentation

## 2019-04-27 HISTORY — PX: ANAL RECTAL MANOMETRY: SHX6358

## 2019-04-27 SURGERY — MANOMETRY, ANORECTAL

## 2019-04-27 NOTE — Progress Notes (Signed)
Anal Manometry done per protocol. Pt tolerated well without distress or complication.  

## 2019-04-28 ENCOUNTER — Encounter (HOSPITAL_COMMUNITY): Payer: Self-pay | Admitting: Gastroenterology

## 2019-05-05 ENCOUNTER — Other Ambulatory Visit: Payer: Self-pay

## 2019-05-05 ENCOUNTER — Ambulatory Visit (INDEPENDENT_AMBULATORY_CARE_PROVIDER_SITE_OTHER): Payer: No Typology Code available for payment source | Admitting: Family

## 2019-05-05 ENCOUNTER — Encounter: Payer: Self-pay | Admitting: Family

## 2019-05-05 VITALS — BP 128/73 | HR 79 | Temp 96.6°F | Ht 67.0 in | Wt 212.0 lb

## 2019-05-05 DIAGNOSIS — F419 Anxiety disorder, unspecified: Secondary | ICD-10-CM

## 2019-05-05 DIAGNOSIS — Z87891 Personal history of nicotine dependence: Secondary | ICD-10-CM

## 2019-05-05 DIAGNOSIS — I10 Essential (primary) hypertension: Secondary | ICD-10-CM

## 2019-05-05 DIAGNOSIS — E559 Vitamin D deficiency, unspecified: Secondary | ICD-10-CM

## 2019-05-05 DIAGNOSIS — F329 Major depressive disorder, single episode, unspecified: Secondary | ICD-10-CM

## 2019-05-05 DIAGNOSIS — I700 Atherosclerosis of aorta: Secondary | ICD-10-CM

## 2019-05-05 DIAGNOSIS — Z23 Encounter for immunization: Secondary | ICD-10-CM | POA: Diagnosis not present

## 2019-05-05 DIAGNOSIS — F32A Depression, unspecified: Secondary | ICD-10-CM

## 2019-05-05 DIAGNOSIS — E1169 Type 2 diabetes mellitus with other specified complication: Secondary | ICD-10-CM

## 2019-05-05 DIAGNOSIS — K5904 Chronic idiopathic constipation: Secondary | ICD-10-CM

## 2019-05-05 DIAGNOSIS — G4733 Obstructive sleep apnea (adult) (pediatric): Secondary | ICD-10-CM

## 2019-05-05 DIAGNOSIS — E669 Obesity, unspecified: Secondary | ICD-10-CM

## 2019-05-05 DIAGNOSIS — M546 Pain in thoracic spine: Secondary | ICD-10-CM

## 2019-05-05 DIAGNOSIS — G8929 Other chronic pain: Secondary | ICD-10-CM

## 2019-05-05 DIAGNOSIS — E785 Hyperlipidemia, unspecified: Secondary | ICD-10-CM

## 2019-05-05 DIAGNOSIS — E039 Hypothyroidism, unspecified: Secondary | ICD-10-CM

## 2019-05-05 LAB — BAYER DCA HB A1C WAIVED: HB A1C (BAYER DCA - WAIVED): 7.3 % — ABNORMAL HIGH (ref <7.0)

## 2019-05-05 NOTE — Progress Notes (Signed)
Subjective:    Patient ID: Brenda Meyer, female    DOB: [birth year], 59 y.o.   MRN: [id number]  Chief Complaint  Patient presents with  . Medical Management of Chronic Issues   Pt presents to the office today for chronic follow up. Followed by GI for chronic constipation and Ileus.  Pt is followed by Pulmonologist annually for CPAP. Followed by GYN annually for pap and mammogram.  Hypertension This is a chronic problem. The current episode started more than 1 year ago. The problem has been resolved since onset. The problem is controlled. Associated symptoms include anxiety and malaise/fatigue. Pertinent negatives include no blurred vision, peripheral edema or shortness of breath. Risk factors for coronary artery disease include dyslipidemia, diabetes mellitus, obesity and sedentary lifestyle. The current treatment provides moderate improvement. There is no history of kidney disease, CAD/MI, CVA or heart failure. Identifiable causes of hypertension include a thyroid problem.  Thyroid Problem Presents for follow-up visit. Symptoms include anxiety, constipation, depressed mood and fatigue. Patient reports no diaphoresis or visual change. The symptoms have been stable. Her past medical history is significant for hyperlipidemia. There is no history of heart failure.  Diabetes She presents for her follow-up diabetic visit. She has type 2 diabetes mellitus. Her disease course has been stable. Hypoglycemia symptoms include nervousness/anxiousness. Associated symptoms include fatigue. Pertinent negatives for diabetes include no blurred vision, no foot paresthesias and no visual change. There are no hypoglycemic complications. Symptoms are stable. Diabetic complications include nephropathy. Pertinent negatives for diabetic complications include no CVA or heart disease. Risk factors for coronary artery disease include dyslipidemia, diabetes mellitus, hypertension, sedentary lifestyle and post-menopausal. She  is following a generally unhealthy diet. Her overall blood glucose range is 130-140 mg/dl. Eye exam is current.  Hyperlipidemia This is a chronic problem. The current episode started more than 1 year ago. The problem is controlled. Recent lipid tests were reviewed and are normal. Exacerbating diseases include obesity. Pertinent negatives include no leg pain or shortness of breath. Current antihyperlipidemic treatment includes statins. The current treatment provides moderate improvement of lipids. Risk factors for coronary artery disease include dyslipidemia, diabetes mellitus, hypertension, a sedentary lifestyle and post-menopausal.  Constipation This is a chronic problem. The current episode started more than 1 year ago. The problem has been resolved since onset. Her stool frequency is 4 to 5 times per week. Associated symptoms include back pain. Risk factors include obesity. She has tried laxatives for the symptoms. The treatment provided moderate relief.  Back Pain This is a chronic problem. The current episode started more than 1 year ago. The problem occurs intermittently. The problem has been waxing and waning since onset. The pain is present in the lumbar spine. The quality of the pain is described as aching. The pain is at a severity of 0/10. The patient is experiencing no pain. Pertinent negatives include no bladder incontinence, bowel incontinence, leg pain or numbness. She has tried NSAIDs and bed rest for the symptoms. The treatment provided mild relief.  Anxiety Presents for follow-up visit. Symptoms include decreased concentration, depressed mood, excessive worry, irritability, nervous/anxious behavior and restlessness. Patient reports no shortness of breath. Symptoms occur occasionally. The severity of symptoms is moderate. The quality of sleep is good.    Depression        This is a chronic problem.  The current episode started more than 1 year ago.   The onset quality is sudden.   The  problem occurs intermittently.  The problem has been  waxing and waning since onset.  Associated symptoms include decreased concentration, fatigue, restlessness and sad.  Associated symptoms include no helplessness, no hopelessness and not irritable.  Past medical history includes thyroid problem and anxiety.   OSA Pt using CPAP every night. Stable.     Review of Systems  Constitutional: Positive for fatigue, irritability and malaise/fatigue. Negative for diaphoresis.  Eyes: Negative for blurred vision.  Respiratory: Negative for shortness of breath.   Gastrointestinal: Positive for constipation. Negative for bowel incontinence.  Genitourinary: Negative for bladder incontinence.  Musculoskeletal: Positive for back pain.  Neurological: Negative for numbness.  Psychiatric/Behavioral: Positive for decreased concentration and depression. The patient is nervous/anxious.   All other systems reviewed and are negative.      Objective:   Physical Exam Vitals signs reviewed.  Constitutional:      General: She is not irritable.She is not in acute distress.    Appearance: She is well-developed.  HENT:     Head: Normocephalic and atraumatic.     Right Ear: Tympanic membrane normal.     Left Ear: Tympanic membrane normal.  Eyes:     Pupils: Pupils are equal, round, and reactive to light.  Neck:     Musculoskeletal: Normal range of motion and neck supple.     Thyroid: No thyromegaly.  Cardiovascular:     Rate and Rhythm: Normal rate and regular rhythm.     Heart sounds: Normal heart sounds. No murmur.  Pulmonary:     Effort: Pulmonary effort is normal. No respiratory distress.     Breath sounds: Normal breath sounds. No wheezing.  Abdominal:     General: Bowel sounds are normal. There is no distension.     Palpations: Abdomen is soft.     Tenderness: There is no abdominal tenderness.  Musculoskeletal: Normal range of motion.        General: No tenderness.  Skin:    General: Skin is  warm and dry.  Neurological:     Mental Status: She is alert and oriented to person, place, and time.     Cranial Nerves: No cranial nerve deficit.     Deep Tendon Reflexes: Reflexes are normal and symmetric.  Psychiatric:        Behavior: Behavior normal.        Thought Content: Thought content normal.        Judgment: Judgment normal.     Diabetic Foot Exam - Simple   Simple Foot Form Diabetic Foot exam was performed with the following findings: Yes 05/05/2019 10:39 AM  Visual Inspection No deformities, no ulcerations, no other skin breakdown bilaterally: Yes Sensation Testing Intact to touch and monofilament testing bilaterally: Yes Pulse Check Posterior Tibialis and Dorsalis pulse intact bilaterally: Yes Comments      BP 128/73   Pulse 79   Temp (!) 96.6 F (35.9 C) (Temporal)   Ht 5' 7"  (1.702 m)   Wt 212 lb (96.2 kg)   BMI 33.20 kg/m      Assessment & Plan:  TAMYKA BEZIO comes in today with chief complaint of Medical Management of Chronic Issues   Diagnosis and orders addressed:  1. Need for immunization against influenza - Flu Vaccine QUAD 36+ mos IM - CMP14+EGFR - CBC with Differential/Platelet  2. Atherosclerosis of aorta (HCC) - CMP14+EGFR - CBC with Differential/Platelet  3. Essential hypertension - CMP14+EGFR - CBC with Differential/Platelet  4. Type 2 diabetes mellitus with other specified complication, without long-term current use of insulin (HCC) - CMP14+EGFR -  CBC with Differential/Platelet - Bayer DCA Hb A1c Waived  5. Vitamin D deficiency - CMP14+EGFR - CBC with Differential/Platelet  6. Obesity (BMI 30-39.9) - CMP14+EGFR - CBC with Differential/Platelet  7. Hyperlipidemia, unspecified hyperlipidemia type - CMP14+EGFR - CBC with Differential/Platelet  8. Hx of smoking - CMP14+EGFR - CBC with Differential/Platelet  9. Chronic idiopathic constipation - CMP14+EGFR - CBC with Differential/Platelet  10. Anxiety and  depression - CMP14+EGFR - CBC with Differential/Platelet  11. Hypothyroidism, unspecified type - CMP14+EGFR - CBC with Differential/Platelet - TSH  12. OSA (obstructive sleep apnea) - CMP14+EGFR - CBC with Differential/Platelet  13. Chronic midline thoracic back pain - CMP14+EGFR - CBC with Differential/Platelet   Labs pending Health Maintenance reviewed Diet and exercise encouraged  Follow up plan: 3 months    Evelina Dun, FNP

## 2019-05-05 NOTE — Patient Instructions (Signed)
Health Maintenance, Female Adopting a healthy lifestyle and getting preventive care are important in promoting health and wellness. Ask your health care provider about:  The right schedule for you to have regular tests and exams.  Things you can do on your own to prevent diseases and keep yourself healthy. What should I know about diet, weight, and exercise? Eat a healthy diet   Eat a diet that includes plenty of vegetables, fruits, low-fat dairy products, and lean protein.  Do not eat a lot of foods that are high in solid fats, added sugars, or sodium. Maintain a healthy weight Body mass index (BMI) is used to identify weight problems. It estimates body fat based on height and weight. Your health care provider can help determine your BMI and help you achieve or maintain a healthy weight. Get regular exercise Get regular exercise. This is one of the most important things you can do for your health. Most adults should:  Exercise for at least 150 minutes each week. The exercise should increase your heart rate and make you sweat (moderate-intensity exercise).  Do strengthening exercises at least twice a week. This is in addition to the moderate-intensity exercise.  Spend less time sitting. Even light physical activity can be beneficial. Watch cholesterol and blood lipids Have your blood tested for lipids and cholesterol at 59 years of age, then have this test every 5 years. Have your cholesterol levels checked more often if:  Your lipid or cholesterol levels are high.  You are older than 59 years of age.  You are at high risk for heart disease. What should I know about cancer screening? Depending on your health history and family history, you may need to have cancer screening at various ages. This may include screening for:  Breast cancer.  Cervical cancer.  Colorectal cancer.  Skin cancer.  Lung cancer. What should I know about heart disease, diabetes, and high blood  pressure? Blood pressure and heart disease  High blood pressure causes heart disease and increases the risk of stroke. This is more likely to develop in people who have high blood pressure readings, are of African descent, or are overweight.  Have your blood pressure checked: ? Every 3-5 years if you are 18-39 years of age. ? Every year if you are 40 years old or older. Diabetes Have regular diabetes screenings. This checks your fasting blood sugar level. Have the screening done:  Once every three years after age 40 if you are at a normal weight and have a low risk for diabetes.  More often and at a younger age if you are overweight or have a high risk for diabetes. What should I know about preventing infection? Hepatitis B If you have a higher risk for hepatitis B, you should be screened for this virus. Talk with your health care provider to find out if you are at risk for hepatitis B infection. Hepatitis C Testing is recommended for:  Everyone born from 1945 through 1965.  Anyone with known risk factors for hepatitis C. Sexually transmitted infections (STIs)  Get screened for STIs, including gonorrhea and chlamydia, if: ? You are sexually active and are younger than 59 years of age. ? You are older than 59 years of age and your health care provider tells you that you are at risk for this type of infection. ? Your sexual activity has changed since you were last screened, and you are at increased risk for chlamydia or gonorrhea. Ask your health care provider if   you are at risk.  Ask your health care provider about whether you are at high risk for HIV. Your health care provider may recommend a prescription medicine to help prevent HIV infection. If you choose to take medicine to prevent HIV, you should first get tested for HIV. You should then be tested every 3 months for as long as you are taking the medicine. Pregnancy  If you are about to stop having your period (premenopausal) and  you may become pregnant, seek counseling before you get pregnant.  Take 400 to 800 micrograms (mcg) of folic acid every day if you become pregnant.  Ask for birth control (contraception) if you want to prevent pregnancy. Osteoporosis and menopause Osteoporosis is a disease in which the bones lose minerals and strength with aging. This can result in bone fractures. If you are 65 years old or older, or if you are at risk for osteoporosis and fractures, ask your health care provider if you should:  Be screened for bone loss.  Take a calcium or vitamin D supplement to lower your risk of fractures.  Be given hormone replacement therapy (HRT) to treat symptoms of menopause. Follow these instructions at home: Lifestyle  Do not use any products that contain nicotine or tobacco, such as cigarettes, e-cigarettes, and chewing tobacco. If you need help quitting, ask your health care provider.  Do not use street drugs.  Do not share needles.  Ask your health care provider for help if you need support or information about quitting drugs. Alcohol use  Do not drink alcohol if: ? Your health care provider tells you not to drink. ? You are pregnant, may be pregnant, or are planning to become pregnant.  If you drink alcohol: ? Limit how much you use to 0-1 drink a day. ? Limit intake if you are breastfeeding.  Be aware of how much alcohol is in your drink. In the [place] one drink equals one 12 oz bottle of beer (355 mL), one 5 oz glass of wine (148 mL), or one 1 oz glass of hard liquor (44 mL). General instructions  Schedule regular health, dental, and eye exams.  Stay current with your vaccines.  Tell your health care provider if: ? You often feel depressed. ? You have ever been abused or do not feel safe at home. Summary  Adopting a healthy lifestyle and getting preventive care are important in promoting health and wellness.  Follow your health care provider's instructions about healthy  diet, exercising, and getting tested or screened for diseases.  Follow your health care provider's instructions on monitoring your cholesterol and blood pressure. This information is not intended to replace advice given to you by your health care provider. Make sure you discuss any questions you have with your health care provider. Document Released: 03/03/2011 Document Revised: 08/11/2018 Document Reviewed: 08/11/2018 Elsevier Patient Education  2020 [employer]  

## 2019-05-06 LAB — CMP14+EGFR
ALT: 10 IU/L (ref 0–32)
AST: 13 IU/L (ref 0–40)
Albumin/Globulin Ratio: 1.8 (ref 1.2–2.2)
Albumin: 4.4 g/dL (ref 3.8–4.9)
Alkaline Phosphatase: 155 IU/L — ABNORMAL HIGH (ref 39–117)
BUN/Creatinine Ratio: 9 (ref 9–23)
BUN: 7 mg/dL (ref 6–24)
Bilirubin Total: 0.2 mg/dL (ref 0.0–1.2)
CO2: 23 mmol/L (ref 20–29)
Calcium: 10 mg/dL (ref 8.7–10.2)
Chloride: 99 mmol/L (ref 96–106)
Creatinine, Ser: 0.76 mg/dL (ref 0.57–1.00)
GFR calc Af Amer: 99 mL/min/{1.73_m2} (ref >59)
GFR calc non Af Amer: 86 mL/min/{1.73_m2} (ref >59)
Globulin, Total: 2.4 g/dL (ref 1.5–4.5)
Glucose: 99 mg/dL (ref 65–99)
Potassium: 4.3 mmol/L (ref 3.5–5.2)
Sodium: 137 mmol/L (ref 134–144)
Total Protein: 6.8 g/dL (ref 6.0–8.5)

## 2019-05-06 LAB — CBC WITH DIFFERENTIAL/PLATELET
Basophils Absolute: 0.1 10*3/uL (ref 0.0–0.2)
Basos: 1 %
EOS (ABSOLUTE): 0.2 10*3/uL (ref 0.0–0.4)
Eos: 2 %
Hematocrit: 40.9 % (ref 34.0–46.6)
Hemoglobin: 13.2 g/dL (ref 11.1–15.9)
Immature Grans (Abs): 0.1 10*3/uL (ref 0.0–0.1)
Immature Granulocytes: 1 %
Lymphocytes Absolute: 3.7 10*3/uL — ABNORMAL HIGH (ref 0.7–3.1)
Lymphs: 30 %
MCH: 27.4 pg (ref 26.6–33.0)
MCHC: 32.3 g/dL (ref 31.5–35.7)
MCV: 85 fL (ref 79–97)
Monocytes Absolute: 0.6 10*3/uL (ref 0.1–0.9)
Monocytes: 5 %
Neutrophils Absolute: 7.6 10*3/uL — ABNORMAL HIGH (ref 1.4–7.0)
Neutrophils: 61 %
Platelets: 332 10*3/uL (ref 150–450)
RBC: 4.82 x10E6/uL (ref 3.77–5.28)
RDW: 15 % (ref 11.7–15.4)
WBC: 12.4 10*3/uL — ABNORMAL HIGH (ref 3.4–10.8)

## 2019-05-06 LAB — TSH: TSH: 3 u[IU]/mL (ref 0.450–4.500)

## 2019-05-18 ENCOUNTER — Other Ambulatory Visit: Payer: Self-pay

## 2019-05-18 DIAGNOSIS — E119 Type 2 diabetes mellitus without complications: Secondary | ICD-10-CM

## 2019-05-18 MED ORDER — METFORMIN HCL 1000 MG PO TABS: ORAL_TABLET | ORAL | 0 refills | Status: DC

## 2019-05-18 MED FILL — metFORMIN HCL 1000 MG TABS: 1000 | 90 days supply | Qty: 180 | Fill #0

## 2019-05-27 MED FILL — JARDIANCE 10 MG TABLET: 10 | 90 days supply | Qty: 90 | Fill #1

## 2019-05-27 MED FILL — LOSARTAN POTASSIUM 50 MG TA: 50 | 30 days supply | Qty: 30 | Fill #1

## 2019-06-01 MED ORDER — PANTOPRAZOLE SODIUM 20 MG PO TBEC: 20.0000 mg | DELAYED_RELEASE_TABLET | Freq: Every day | ORAL | 0 refills | Status: DC

## 2019-06-02 ENCOUNTER — Other Ambulatory Visit: Payer: Self-pay | Admitting: Family

## 2019-06-02 MED ORDER — PANTOPRAZOLE SODIUM 20 MG PO TBEC: 20.0000 mg | DELAYED_RELEASE_TABLET | Freq: Every day | ORAL | 0 refills | Status: DC

## 2019-06-06 ENCOUNTER — Other Ambulatory Visit: Payer: Self-pay | Admitting: Family

## 2019-06-06 DIAGNOSIS — F419 Anxiety disorder, unspecified: Secondary | ICD-10-CM

## 2019-06-06 DIAGNOSIS — M546 Pain in thoracic spine: Secondary | ICD-10-CM

## 2019-06-06 DIAGNOSIS — F32A Depression, unspecified: Secondary | ICD-10-CM

## 2019-06-06 DIAGNOSIS — F329 Major depressive disorder, single episode, unspecified: Secondary | ICD-10-CM

## 2019-06-06 MED FILL — DULoxetine HCL 30 MG CPEP: 30 | 90 days supply | Qty: 180 | Fill #0

## 2019-06-07 ENCOUNTER — Other Ambulatory Visit: Payer: Self-pay

## 2019-06-07 MED FILL — PANTOPRAZOLE SOD DR 20 MG T: 20 | 30 days supply | Qty: 30 | Fill #0

## 2019-06-24 ENCOUNTER — Other Ambulatory Visit: Payer: Self-pay | Admitting: Family

## 2019-06-24 DIAGNOSIS — E559 Vitamin D deficiency, unspecified: Secondary | ICD-10-CM

## 2019-06-24 MED FILL — DULoxetine HCL 30 MG CPEP: 30 | 90 days supply | Qty: 180 | Fill #0

## 2019-06-24 MED FILL — VIT D2 1.25 MG (50,000 UNIT: 1.25 MG | 84 days supply | Qty: 12 | Fill #0

## 2019-06-29 MED FILL — LOSARTAN POTASSIUM 50 MG TA: 50 | 30 days supply | Qty: 30 | Fill #2

## 2019-07-26 MED FILL — CYCLOBENZAPRINE HCL 10 MG T: 10 | 90 days supply | Qty: 90 | Fill #2

## 2019-07-26 MED FILL — ATORVASTATIN 20 MG TABLET: 20 | 90 days supply | Qty: 90 | Fill #0

## 2019-08-02 ENCOUNTER — Other Ambulatory Visit: Payer: Self-pay | Admitting: Family

## 2019-08-02 DIAGNOSIS — I10 Essential (primary) hypertension: Secondary | ICD-10-CM

## 2019-08-02 MED FILL — LEVOTHYROXINE 50 MCG TABLET: 50 | 90 days supply | Qty: 90 | Fill #2

## 2019-08-02 MED FILL — LOSARTAN POTASSIUM 50 MG TA: 50 | 30 days supply | Qty: 30 | Fill #0

## 2019-08-02 MED FILL — LINZESS 290 MCG CAPSULE: 290 | 90 days supply | Qty: 90 | Fill #0

## 2019-08-24 ENCOUNTER — Other Ambulatory Visit: Payer: Self-pay | Admitting: Family

## 2019-08-24 DIAGNOSIS — E119 Type 2 diabetes mellitus without complications: Secondary | ICD-10-CM

## 2019-08-24 DIAGNOSIS — I10 Essential (primary) hypertension: Secondary | ICD-10-CM

## 2019-08-24 MED FILL — metFORMIN HCL 1000 MG TABS: 1000 | 30 days supply | Qty: 60 | Fill #0

## 2019-08-24 MED FILL — JARDIANCE 10 MG TABLET: 10 | 30 days supply | Qty: 30 | Fill #0

## 2019-08-29 MED FILL — LOSARTAN POTASSIUM 50 MG TA: 50 | 30 days supply | Qty: 30 | Fill #0

## 2019-09-27 ENCOUNTER — Other Ambulatory Visit: Payer: Self-pay | Admitting: Family

## 2019-09-27 DIAGNOSIS — F32A Depression, unspecified: Secondary | ICD-10-CM

## 2019-09-27 DIAGNOSIS — E559 Vitamin D deficiency, unspecified: Secondary | ICD-10-CM

## 2019-09-27 DIAGNOSIS — F329 Major depressive disorder, single episode, unspecified: Secondary | ICD-10-CM

## 2019-09-27 DIAGNOSIS — F419 Anxiety disorder, unspecified: Secondary | ICD-10-CM

## 2019-09-27 DIAGNOSIS — I10 Essential (primary) hypertension: Secondary | ICD-10-CM

## 2019-09-27 DIAGNOSIS — E119 Type 2 diabetes mellitus without complications: Secondary | ICD-10-CM

## 2019-09-27 DIAGNOSIS — M546 Pain in thoracic spine: Secondary | ICD-10-CM

## 2019-09-27 MED FILL — VIT D2 1.25 MG (50,000 UNIT: 1.25 MG | 84 days supply | Qty: 12 | Fill #0

## 2019-09-27 MED FILL — DULoxetine HCL 30 MG CPEP: 30 | 90 days supply | Qty: 180 | Fill #0

## 2019-09-27 MED FILL — LOSARTAN POTASSIUM 50 MG TA: 50 | 30 days supply | Qty: 30 | Fill #0

## 2019-09-27 MED FILL — metFORMIN HCL 1000 MG TABS: 1000 | 30 days supply | Qty: 60 | Fill #0

## 2019-09-27 MED FILL — JARDIANCE 10 MG TABLET: 10 | 30 days supply | Qty: 30 | Fill #0

## 2019-10-19 ENCOUNTER — Other Ambulatory Visit: Payer: Self-pay | Admitting: Family

## 2019-10-19 DIAGNOSIS — E7800 Pure hypercholesterolemia, unspecified: Secondary | ICD-10-CM

## 2019-10-19 DIAGNOSIS — G8929 Other chronic pain: Secondary | ICD-10-CM

## 2019-10-19 DIAGNOSIS — E785 Hyperlipidemia, unspecified: Secondary | ICD-10-CM

## 2019-10-19 MED FILL — ATORVASTATIN 20 MG TABLET: 20 | 90 days supply | Qty: 90 | Fill #0

## 2019-10-19 MED FILL — PANTOPRAZOLE SOD DR 20 MG T: 20 | 30 days supply | Qty: 30 | Fill #0

## 2019-10-21 MED FILL — CYCLOBENZAPRINE HCL 10 MG T: 10 | 90 days supply | Qty: 90 | Fill #0

## 2019-10-26 ENCOUNTER — Other Ambulatory Visit: Payer: Self-pay | Admitting: Family

## 2019-10-26 DIAGNOSIS — I10 Essential (primary) hypertension: Secondary | ICD-10-CM

## 2019-10-26 MED FILL — LINZESS 290 MCG CAPSULE: 290 | 90 days supply | Qty: 90 | Fill #1

## 2019-10-26 MED FILL — LOSARTAN POTASSIUM 50 MG TA: 50 | 30 days supply | Qty: 30 | Fill #0

## 2019-10-26 MED FILL — LEVOTHYROXINE 50 MCG TABLET: 50 | 90 days supply | Qty: 90 | Fill #3

## 2019-11-02 ENCOUNTER — Other Ambulatory Visit: Payer: Self-pay | Admitting: Family

## 2019-11-02 DIAGNOSIS — E119 Type 2 diabetes mellitus without complications: Secondary | ICD-10-CM

## 2019-11-02 MED FILL — metFORMIN HCL 1000 MG TABS: 1000 | 30 days supply | Qty: 60 | Fill #0

## 2019-11-02 MED FILL — JARDIANCE 10 MG TABLET: 10 | 30 days supply | Qty: 30 | Fill #0

## 2019-11-22 ENCOUNTER — Encounter: Payer: Self-pay | Admitting: Family

## 2019-11-22 ENCOUNTER — Ambulatory Visit (INDEPENDENT_AMBULATORY_CARE_PROVIDER_SITE_OTHER): Payer: No Typology Code available for payment source | Admitting: Family

## 2019-11-22 ENCOUNTER — Other Ambulatory Visit: Payer: Self-pay

## 2019-11-22 ENCOUNTER — Other Ambulatory Visit: Payer: Self-pay | Admitting: Family

## 2019-11-22 VITALS — BP 132/71 | HR 71 | Temp 97.3°F | Ht 67.0 in | Wt 204.8 lb

## 2019-11-22 DIAGNOSIS — F32A Depression, unspecified: Secondary | ICD-10-CM

## 2019-11-22 DIAGNOSIS — E785 Hyperlipidemia, unspecified: Secondary | ICD-10-CM

## 2019-11-22 DIAGNOSIS — G8929 Other chronic pain: Secondary | ICD-10-CM

## 2019-11-22 DIAGNOSIS — Z23 Encounter for immunization: Secondary | ICD-10-CM | POA: Diagnosis not present

## 2019-11-22 DIAGNOSIS — M546 Pain in thoracic spine: Secondary | ICD-10-CM

## 2019-11-22 DIAGNOSIS — E7800 Pure hypercholesterolemia, unspecified: Secondary | ICD-10-CM

## 2019-11-22 DIAGNOSIS — F419 Anxiety disorder, unspecified: Secondary | ICD-10-CM

## 2019-11-22 DIAGNOSIS — E1169 Type 2 diabetes mellitus with other specified complication: Secondary | ICD-10-CM

## 2019-11-22 DIAGNOSIS — E039 Hypothyroidism, unspecified: Secondary | ICD-10-CM

## 2019-11-22 DIAGNOSIS — E119 Type 2 diabetes mellitus without complications: Secondary | ICD-10-CM

## 2019-11-22 DIAGNOSIS — F329 Major depressive disorder, single episode, unspecified: Secondary | ICD-10-CM

## 2019-11-22 DIAGNOSIS — Z029 Encounter for administrative examinations, unspecified: Secondary | ICD-10-CM

## 2019-11-22 DIAGNOSIS — I10 Essential (primary) hypertension: Secondary | ICD-10-CM

## 2019-11-22 DIAGNOSIS — E559 Vitamin D deficiency, unspecified: Secondary | ICD-10-CM

## 2019-11-22 LAB — BAYER DCA HB A1C WAIVED: HB A1C (BAYER DCA - WAIVED): 7.3 % — ABNORMAL HIGH (ref <7.0)

## 2019-11-22 MED ORDER — LINACLOTIDE 290 MCG PO CAPS: ORAL_CAPSULE | ORAL | 1 refills | Status: DC

## 2019-11-22 MED ORDER — LOSARTAN POTASSIUM 50 MG PO TABS: 50.0000 mg | ORAL_TABLET | Freq: Every day | ORAL | 4 refills | Status: DC

## 2019-11-22 MED ORDER — ATORVASTATIN CALCIUM 20 MG PO TABS: 20.0000 mg | ORAL_TABLET | Freq: Every day | ORAL | 0 refills | Status: DC

## 2019-11-22 MED ORDER — CYCLOBENZAPRINE HCL 10 MG PO TABS: 10.0000 mg | ORAL_TABLET | Freq: Every day | ORAL | 2 refills | Status: DC

## 2019-11-22 MED ORDER — DULOXETINE HCL 60 MG PO CPEP: 60.0000 mg | ORAL_CAPSULE | Freq: Every day | ORAL | 3 refills | Status: DC

## 2019-11-22 MED ORDER — METFORMIN HCL 1000 MG PO TABS: 1000.0000 mg | ORAL_TABLET | Freq: Two times a day (BID) | ORAL | 3 refills | Status: DC

## 2019-11-22 MED ORDER — LEVOTHYROXINE SODIUM 50 MCG PO TABS: 50.0000 ug | ORAL_TABLET | Freq: Every day | ORAL | 3 refills | Status: DC

## 2019-11-22 MED ORDER — VITAMIN D (ERGOCALCIFEROL) 1.25 MG (50000 UNIT) PO CAPS: ORAL_CAPSULE | ORAL | 4 refills | Status: DC

## 2019-11-22 MED ORDER — PANTOPRAZOLE SODIUM 20 MG PO TBEC: 20.0000 mg | DELAYED_RELEASE_TABLET | Freq: Every day | ORAL | 0 refills | Status: DC

## 2019-11-22 MED ORDER — JARDIANCE 10 MG PO TABS: ORAL_TABLET | ORAL | 4 refills | Status: DC

## 2019-11-22 MED FILL — PANTOPRAZOLE SOD DR 20 MG T: 20 | 30 days supply | Qty: 30 | Fill #0

## 2019-11-22 MED FILL — LOSARTAN POTASSIUM 50 MG TA: 50 | 90 days supply | Qty: 90 | Fill #0

## 2019-11-22 MED FILL — DULOXETINE HCL 60 MG CPEP: 60 | 90 days supply | Qty: 90 | Fill #0

## 2019-11-22 NOTE — Progress Notes (Signed)
Subjective:    Patient ID: Brenda Meyer, female    DOB: 1961-04-20, 59 y.o.   MRN: [id number]  Chief Complaint  Patient presents with  . Medical Management of Chronic Issues   Pt presents to the office today for chronic follow up. Followed by GI for chronic constipation and Ileus.  Pt is followed by Pulmonologist annually for CPAP. Followed by GYN annually for pap and mammogram.  Hypertension This is a chronic problem. The current episode started more than 1 year ago. The problem has been resolved since onset. The problem is controlled. Associated symptoms include anxiety. Pertinent negatives include no blurred vision, malaise/fatigue, peripheral edema, shortness of breath or sweats. Risk factors for coronary artery disease include dyslipidemia, obesity and sedentary lifestyle. The current treatment provides moderate improvement. There is no history of kidney disease, CAD/MI, CVA or heart failure. Identifiable causes of hypertension include a thyroid problem.  Thyroid Problem Presents for follow-up visit. Symptoms include anxiety, constipation, depressed mood and fatigue. The symptoms have been stable. Her past medical history is significant for hyperlipidemia. There is no history of heart failure.  Diabetes She presents for her follow-up diabetic visit. She has type 2 diabetes mellitus. Her disease course has been stable. Hypoglycemia symptoms include nervousness/anxiousness. Pertinent negatives for hypoglycemia include no confusion or sweats. Associated symptoms include fatigue. Pertinent negatives for diabetes include no blurred vision. There are no hypoglycemic complications. Symptoms are stable. Pertinent negatives for diabetic complications include no CVA or heart disease. Risk factors for coronary artery disease include diabetes mellitus, dyslipidemia, hypertension and sedentary lifestyle. She is following a generally unhealthy diet. Her overall blood glucose range is 130-140 mg/dl. Eye exam  is current.  Hyperlipidemia This is a chronic problem. The current episode started more than 1 year ago. The problem is controlled. Recent lipid tests were reviewed and are normal. Exacerbating diseases include obesity. Pertinent negatives include no leg pain or shortness of breath. Current antihyperlipidemic treatment includes statins. The current treatment provides moderate improvement of lipids. Risk factors for coronary artery disease include diabetes mellitus, hypertension, post-menopausal and a sedentary lifestyle.  Constipation This is a chronic problem. The current episode started more than 1 year ago. The problem has been waxing and waning since onset. Associated symptoms include back pain. Risk factors include obesity. She has tried fiber and laxatives for the symptoms. The treatment provided moderate relief.  Back Pain This is a chronic problem. The current episode started more than 1 year ago. The problem occurs intermittently. The problem has been waxing and waning since onset. The pain is present in the lumbar spine. The quality of the pain is described as aching. The pain is at a severity of 2/10. The pain is moderate. The symptoms are aggravated by bending. Pertinent negatives include no bladder incontinence, bowel incontinence, leg pain, pelvic pain or perianal numbness. Risk factors include obesity. The treatment provided moderate relief.  Anxiety Presents for follow-up visit. Symptoms include depressed mood, excessive worry, irritability, nervous/anxious behavior and restlessness. Patient reports no confusion or shortness of breath. Symptoms occur occasionally. The severity of symptoms is moderate. The quality of sleep is good.    Depression        This is a chronic problem.  The current episode started more than 1 year ago.   The onset quality is gradual.   The problem occurs intermittently.  Associated symptoms include fatigue, irritable, restlessness and sad.  Associated symptoms  include no helplessness and no hopelessness.  Past medical history includes  thyroid problem and anxiety.       Review of Systems  Constitutional: Positive for fatigue and irritability. Negative for malaise/fatigue.  Eyes: Negative for blurred vision.  Respiratory: Negative for shortness of breath.   Gastrointestinal: Positive for constipation. Negative for bowel incontinence.  Genitourinary: Negative for bladder incontinence and pelvic pain.  Musculoskeletal: Positive for back pain.  Psychiatric/Behavioral: Positive for depression. Negative for confusion. The patient is nervous/anxious.   All other systems reviewed and are negative.      Objective:   Physical Exam Vitals reviewed.  Constitutional:      General: She is irritable. She is not in acute distress.    Appearance: She is well-developed.  HENT:     Head: Normocephalic and atraumatic.     Right Ear: Tympanic membrane normal.     Left Ear: Tympanic membrane normal.  Eyes:     Pupils: Pupils are equal, round, and reactive to light.  Neck:     Thyroid: No thyromegaly.  Cardiovascular:     Rate and Rhythm: Normal rate and regular rhythm.     Heart sounds: Normal heart sounds. No murmur.  Pulmonary:     Effort: Pulmonary effort is normal. No respiratory distress.     Breath sounds: Normal breath sounds. No wheezing.  Abdominal:     General: Bowel sounds are normal. There is no distension.     Palpations: Abdomen is soft.     Tenderness: There is no abdominal tenderness.  Musculoskeletal:        General: No tenderness. Normal range of motion.     Cervical back: Normal range of motion and neck supple.     Right lower leg: Edema (trace) present.     Left lower leg: Edema (trace) present.  Skin:    General: Skin is warm and dry.  Neurological:     Mental Status: She is alert and oriented to person, place, and time.     Cranial Nerves: No cranial nerve deficit.     Deep Tendon Reflexes: Reflexes are normal and  symmetric.  Psychiatric:        Behavior: Behavior normal.        Thought Content: Thought content normal.        Judgment: Judgment normal.       BP 132/71   Pulse 71   Temp (!) 97.3 F (36.3 C) (Temporal)   Ht 5' 7"  (1.702 m)   Wt 204 lb 12.8 oz (92.9 kg)   SpO2 100%   BMI 32.08 kg/m      Assessment & Plan:  Brenda Meyer comes in today with chief complaint of Medical Management of Chronic Issues   Diagnosis and orders addressed:  1. Type 2 diabetes mellitus with other specified complication, without long-term current use of insulin (HCC - Bayer DCA Hb A1c Waived - CMP14+EGFR - CBC with Differential/Platelet - Microalbumin / creatinine urine ratio  2. Pure hypercholesterolemia  - atorvastatin (LIPITOR) 20 MG tablet; Take 1 tablet (20 mg total) by mouth daily.  Dispense: 90 tablet; Refill: 0 - CMP14+EGFR - CBC with Differential/Platelet  3. Hyperlipidemia, unspecified hyperlipidemia type - atorvastatin (LIPITOR) 20 MG tablet; Take 1 tablet (20 mg total) by mouth daily.  Dispense: 90 tablet; Refill: 0 - CMP14+EGFR - CBC with Differential/Platelet  4. Chronic midline thoracic back pain - cyclobenzaprine (FLEXERIL) 10 MG tablet; Take 1 tablet (10 mg total) by mouth at bedtime.  Dispense: 90 tablet; Refill: 2 - CMP14+EGFR - CBC with Differential/Platelet  5. Midline thoracic back pain, unspecified chronicity - CMP14+EGFR - CBC with Differential/Platelet  6. Anxiety and depression - CMP14+EGFR - CBC with Differential/Platelet  7. Hypothyroidism, unspecified type - levothyroxine (SYNTHROID) 50 MCG tablet; Take 1 tablet (50 mcg total) by mouth daily.  Dispense: 90 tablet; Refill: 3 - CMP14+EGFR - CBC with Differential/Platelet - TSH  8. Essential hypertension - losartan (COZAAR) 50 MG tablet; Take 1 tablet (50 mg total) by mouth daily.  Dispense: 90 tablet; Refill: 4 - CMP14+EGFR - CBC with Differential/Platelet  9. Type 2 diabetes mellitus without  complication, without long-term current use of insulin (HCC) - metFORMIN (GLUCOPHAGE) 1000 MG tablet; Take 1 tablet (1,000 mg total) by mouth 2 (two) times daily with a meal.  Dispense: 180 tablet; Refill: 3 - CMP14+EGFR - CBC with Differential/Platelet  10. Vitamin D deficiency - Vitamin D, Ergocalciferol, (DRISDOL) 1.25 MG (50000 UNIT) CAPS capsule; TAKE 1 CAPSULE BY MOUTH EVERY 7 DAYS.  Dispense: 12 capsule; Refill: 4 - CMP14+EGFR - CBC with Differential/Platelet   Labs pending Health Maintenance reviewed Diet and exercise encouraged  Follow up plan: 6 months    Evelina Dun, FNP

## 2019-11-22 NOTE — Patient Instructions (Signed)
Health Maintenance, Female Adopting a healthy lifestyle and getting preventive care are important in promoting health and wellness. Ask your health care provider about:  The right schedule for you to have regular tests and exams.  Things you can do on your own to prevent diseases and keep yourself healthy. What should I know about diet, weight, and exercise? Eat a healthy diet   Eat a diet that includes plenty of vegetables, fruits, low-fat dairy products, and lean protein.  Do not eat a lot of foods that are high in solid fats, added sugars, or sodium. Maintain a healthy weight Body mass index (BMI) is used to identify weight problems. It estimates body fat based on height and weight. Your health care provider can help determine your BMI and help you achieve or maintain a healthy weight. Get regular exercise Get regular exercise. This is one of the most important things you can do for your health. Most adults should:  Exercise for at least 150 minutes each week. The exercise should increase your heart rate and make you sweat (moderate-intensity exercise).  Do strengthening exercises at least twice a week. This is in addition to the moderate-intensity exercise.  Spend less time sitting. Even light physical activity can be beneficial. Watch cholesterol and blood lipids Have your blood tested for lipids and cholesterol at 60 years of age, then have this test every 5 years. Have your cholesterol levels checked more often if:  Your lipid or cholesterol levels are high.  You are older than 60 years of age.  You are at high risk for heart disease. What should I know about cancer screening? Depending on your health history and family history, you may need to have cancer screening at various ages. This may include screening for:  Breast cancer.  Cervical cancer.  Colorectal cancer.  Skin cancer.  Lung cancer. What should I know about heart disease, diabetes, and high blood  pressure? Blood pressure and heart disease  High blood pressure causes heart disease and increases the risk of stroke. This is more likely to develop in people who have high blood pressure readings, are of African descent, or are overweight.  Have your blood pressure checked: ? Every 3-5 years if you are 18-39 years of age. ? Every year if you are 40 years old or older. Diabetes Have regular diabetes screenings. This checks your fasting blood sugar level. Have the screening done:  Once every three years after age 40 if you are at a normal weight and have a low risk for diabetes.  More often and at a younger age if you are overweight or have a high risk for diabetes. What should I know about preventing infection? Hepatitis B If you have a higher risk for hepatitis B, you should be screened for this virus. Talk with your health care provider to find out if you are at risk for hepatitis B infection. Hepatitis C Testing is recommended for:  Everyone born from 1945 through 1965.  Anyone with known risk factors for hepatitis C. Sexually transmitted infections (STIs)  Get screened for STIs, including gonorrhea and chlamydia, if: ? You are sexually active and are younger than 60 years of age. ? You are older than 60 years of age and your health care provider tells you that you are at risk for this type of infection. ? Your sexual activity has changed since you were last screened, and you are at increased risk for chlamydia or gonorrhea. Ask your health care provider if   you are at risk.  Ask your health care provider about whether you are at high risk for HIV. Your health care provider may recommend a prescription medicine to help prevent HIV infection. If you choose to take medicine to prevent HIV, you should first get tested for HIV. You should then be tested every 3 months for as long as you are taking the medicine. Pregnancy  If you are about to stop having your period (premenopausal) and  you may become pregnant, seek counseling before you get pregnant.  Take 400 to 800 micrograms (mcg) of folic acid every day if you become pregnant.  Ask for birth control (contraception) if you want to prevent pregnancy. Osteoporosis and menopause Osteoporosis is a disease in which the bones lose minerals and strength with aging. This can result in bone fractures. If you are 65 years old or older, or if you are at risk for osteoporosis and fractures, ask your health care provider if you should:  Be screened for bone loss.  Take a calcium or vitamin D supplement to lower your risk of fractures.  Be given hormone replacement therapy (HRT) to treat symptoms of menopause. Follow these instructions at home: Lifestyle  Do not use any products that contain nicotine or tobacco, such as cigarettes, e-cigarettes, and chewing tobacco. If you need help quitting, ask your health care provider.  Do not use street drugs.  Do not share needles.  Ask your health care provider for help if you need support or information about quitting drugs. Alcohol use  Do not drink alcohol if: ? Your health care provider tells you not to drink. ? You are pregnant, may be pregnant, or are planning to become pregnant.  If you drink alcohol: ? Limit how much you use to 0-1 drink a day. ? Limit intake if you are breastfeeding.  Be aware of how much alcohol is in your drink. In the [place] one drink equals one 12 oz bottle of beer (355 mL), one 5 oz glass of wine (148 mL), or one 1 oz glass of hard liquor (44 mL). General instructions  Schedule regular health, dental, and eye exams.  Stay current with your vaccines.  Tell your health care provider if: ? You often feel depressed. ? You have ever been abused or do not feel safe at home. Summary  Adopting a healthy lifestyle and getting preventive care are important in promoting health and wellness.  Follow your health care provider's instructions about healthy  diet, exercising, and getting tested or screened for diseases.  Follow your health care provider's instructions on monitoring your cholesterol and blood pressure. This information is not intended to replace advice given to you by your health care provider. Make sure you discuss any questions you have with your health care provider. Document Revised: 08/11/2018 Document Reviewed: 08/11/2018 Elsevier Patient Education  2020 Elsevier Inc.  

## 2019-11-23 LAB — CBC WITH DIFFERENTIAL/PLATELET
Basophils Absolute: 0.1 10*3/uL (ref 0.0–0.2)
Basos: 1 %
EOS (ABSOLUTE): 0.3 10*3/uL (ref 0.0–0.4)
Eos: 3 %
Hematocrit: 39 % (ref 34.0–46.6)
Hemoglobin: 12.6 g/dL (ref 11.1–15.9)
Immature Grans (Abs): 0.1 10*3/uL (ref 0.0–0.1)
Immature Granulocytes: 1 %
Lymphocytes Absolute: 2.8 10*3/uL (ref 0.7–3.1)
Lymphs: 27 %
MCH: 27.6 pg (ref 26.6–33.0)
MCHC: 32.3 g/dL (ref 31.5–35.7)
MCV: 86 fL (ref 79–97)
Monocytes Absolute: 0.5 10*3/uL (ref 0.1–0.9)
Monocytes: 5 %
Neutrophils Absolute: 6.6 10*3/uL (ref 1.4–7.0)
Neutrophils: 63 %
Platelets: 285 10*3/uL (ref 150–450)
RBC: 4.56 x10E6/uL (ref 3.77–5.28)
RDW: 15.5 % — ABNORMAL HIGH (ref 11.7–15.4)
WBC: 10.3 10*3/uL (ref 3.4–10.8)

## 2019-11-23 LAB — CMP14+EGFR
ALT: 8 IU/L (ref 0–32)
AST: 11 IU/L (ref 0–40)
Albumin/Globulin Ratio: 1.7 (ref 1.2–2.2)
Albumin: 4 g/dL (ref 3.8–4.9)
Alkaline Phosphatase: 159 IU/L — ABNORMAL HIGH (ref 39–117)
BUN/Creatinine Ratio: 10 — ABNORMAL LOW (ref 12–28)
BUN: 7 mg/dL — ABNORMAL LOW (ref 8–27)
Bilirubin Total: 0.2 mg/dL (ref 0.0–1.2)
CO2: 23 mmol/L (ref 20–29)
Calcium: 9.9 mg/dL (ref 8.7–10.3)
Chloride: 104 mmol/L (ref 96–106)
Creatinine, Ser: 0.69 mg/dL (ref 0.57–1.00)
GFR calc Af Amer: 109 mL/min/{1.73_m2} (ref >59)
GFR calc non Af Amer: 95 mL/min/{1.73_m2} (ref >59)
Globulin, Total: 2.3 g/dL (ref 1.5–4.5)
Glucose: 98 mg/dL (ref 65–99)
Potassium: 4.6 mmol/L (ref 3.5–5.2)
Sodium: 140 mmol/L (ref 134–144)
Total Protein: 6.3 g/dL (ref 6.0–8.5)

## 2019-11-23 LAB — MICROALBUMIN / CREATININE URINE RATIO
Creatinine, Urine: 10.8 mg/dL
Microalb/Creat Ratio: 40 mg/g creat — ABNORMAL HIGH (ref 0–29)
Microalbumin, Urine: 4.3 ug/mL

## 2019-11-23 LAB — TSH: TSH: 2.34 u[IU]/mL (ref 0.450–4.500)

## 2019-11-25 ENCOUNTER — Other Ambulatory Visit: Payer: Self-pay | Admitting: Family

## 2019-11-26 MED FILL — JARDIANCE 10 MG TABLET: 10 | 90 days supply | Qty: 90 | Fill #0

## 2019-11-29 MED FILL — METFORMIN HCL 1000 MG TABS: 1000 | 90 days supply | Qty: 180 | Fill #0

## 2019-12-19 MED FILL — VIT D2 1.25 MG (50,000 UNIT: 1.25 MG | 84 days supply | Qty: 12 | Fill #0

## 2020-01-16 MED FILL — ATORVASTATIN 20 MG TABLET: 20 | 90 days supply | Qty: 90 | Fill #0

## 2020-01-16 MED FILL — LEVOTHYROXINE 50 MCG TABLET: 50 | 90 days supply | Qty: 90 | Fill #0

## 2020-01-16 MED FILL — CYCLOBENZAPRINE HCL 10 MG T: 10 | 90 days supply | Qty: 90 | Fill #1

## 2020-01-18 ENCOUNTER — Other Ambulatory Visit: Payer: Self-pay

## 2020-01-18 ENCOUNTER — Ambulatory Visit (INDEPENDENT_AMBULATORY_CARE_PROVIDER_SITE_OTHER): Payer: No Typology Code available for payment source | Admitting: Family Medicine

## 2020-01-18 DIAGNOSIS — Z23 Encounter for immunization: Secondary | ICD-10-CM

## 2020-02-04 MED FILL — LINZESS 290 MCG CAPSULE: 290 | 90 days supply | Qty: 90 | Fill #0

## 2020-02-25 MED FILL — LOSARTAN POTASSIUM 50 MG TA: 50 | 90 days supply | Qty: 90 | Fill #1

## 2020-03-03 MED FILL — METFORMIN HCL 1000 MG TABS: 1000 | 90 days supply | Qty: 180 | Fill #1

## 2020-03-03 MED FILL — JARDIANCE 10 MG TABLET: 10 | 90 days supply | Qty: 90 | Fill #1

## 2020-03-21 ENCOUNTER — Other Ambulatory Visit: Payer: Self-pay

## 2020-03-21 ENCOUNTER — Ambulatory Visit (INDEPENDENT_AMBULATORY_CARE_PROVIDER_SITE_OTHER): Payer: No Typology Code available for payment source | Admitting: *Deleted

## 2020-03-21 DIAGNOSIS — Z23 Encounter for immunization: Secondary | ICD-10-CM | POA: Diagnosis not present

## 2020-03-21 MED FILL — VIT D2 1.25 MG (50,000 UNIT: 1.25 MG | 84 days supply | Qty: 12 | Fill #1

## 2020-03-21 MED FILL — DULOXETINE HCL 60 MG CPEP: 60 | 90 days supply | Qty: 90 | Fill #1

## 2020-04-19 ENCOUNTER — Other Ambulatory Visit: Payer: Self-pay | Admitting: Family

## 2020-04-19 MED FILL — CYCLOBENZAPRINE HCL 10 MG T: 10 | 90 days supply | Qty: 90 | Fill #2

## 2020-04-19 MED FILL — LEVOTHYROXINE 50 MCG TABLET: 50 | 90 days supply | Qty: 90 | Fill #1

## 2020-04-19 MED FILL — PANTOPRAZOLE SOD DR 20 MG T: 20 | 30 days supply | Qty: 30 | Fill #0

## 2020-04-19 NOTE — Telephone Encounter (Signed)
30 day supply given today, ntbs for further refills 

## 2020-04-19 NOTE — Telephone Encounter (Signed)
Appt made

## 2020-04-25 ENCOUNTER — Encounter: Payer: Self-pay | Admitting: Family

## 2020-04-28 ENCOUNTER — Other Ambulatory Visit: Payer: Self-pay | Admitting: Family

## 2020-04-28 DIAGNOSIS — E7800 Pure hypercholesterolemia, unspecified: Secondary | ICD-10-CM

## 2020-04-28 DIAGNOSIS — E785 Hyperlipidemia, unspecified: Secondary | ICD-10-CM

## 2020-04-28 MED FILL — LINZESS 290 MCG CAPSULE: 290 | 90 days supply | Qty: 90 | Fill #1

## 2020-04-30 MED FILL — ATORVASTATIN CALCIUM 20 MG: 20 | 90 days supply | Qty: 90 | Fill #0

## 2020-05-03 ENCOUNTER — Other Ambulatory Visit: Payer: Self-pay

## 2020-05-03 ENCOUNTER — Ambulatory Visit (INDEPENDENT_AMBULATORY_CARE_PROVIDER_SITE_OTHER): Payer: No Typology Code available for payment source

## 2020-05-03 ENCOUNTER — Encounter (HOSPITAL_COMMUNITY): Payer: Self-pay | Admitting: Emergency Medicine

## 2020-05-03 ENCOUNTER — Ambulatory Visit (HOSPITAL_COMMUNITY)
Admission: EM | Admit: 2020-05-03 | Discharge: 2020-05-03 | Disposition: A | Discharge status: Home / Self Care | Payer: No Typology Code available for payment source | Attending: Family Medicine | Admitting: Family Medicine

## 2020-05-03 DIAGNOSIS — M79644 Pain in right finger(s): Secondary | ICD-10-CM | POA: Diagnosis not present

## 2020-05-03 DIAGNOSIS — S6991XA Unspecified injury of right wrist, hand and finger(s), initial encounter: Secondary | ICD-10-CM

## 2020-05-03 MED ORDER — DOXYCYCLINE HYCLATE 100 MG PO CAPS: 100.0000 mg | ORAL_CAPSULE | Freq: Two times a day (BID) | ORAL | 0 refills | Status: DC

## 2020-05-03 NOTE — ED Triage Notes (Addendum)
PT fell and injured right thumb. It has not improved and is throbbing. Her right thumbnail bent backward in the fall.

## 2020-05-08 NOTE — ED Provider Notes (Signed)
[facility]   [id number] 05/03/20 Arrival Time: 1904  ASSESSMENT & PLAN:  1. Pain of right thumb     I have personally viewed the imaging studies ordered this visit. No thumb fracture identified.  Question beginning infection under nail. Trephination attempted with scant opaque yellow drainage. Begin: Meds ordered this encounter  Medications  . doxycycline (VIBRAMYCIN) 100 MG capsule    Sig: Take 1 capsule (100 mg total) by mouth 2 (two) times daily.    Dispense:  14 capsule    Refill:  0    Orders Placed This Encounter  Procedures  . DG Finger Thumb Right    Recommend:  Follow-up Information    [facility].   Specialty: Urgent Care Why: If worsening or failing to improve as anticipated. Contact information: [address] [place] [address] [phone]              Reviewed expectations re: course of current medical issues. Questions answered. Outlined signs and symptoms indicating need for more acute intervention. Patient verbalized understanding. After Visit Summary given.  SUBJECTIVE: History from: patient. Brenda Meyer is a 59 y.o. female who reports R thumb injury after fall. Distal throbbing and tenderness. No bleeding. No extremity sensation changes or weakness. She questions if getting infection under nail. Afebrile. No drainage.   Past Surgical History:  Procedure Laterality Date  . ANAL RECTAL MANOMETRY N/A 04/27/2019   Procedure: ANO RECTAL MANOMETRY/balloon expulsion;  Surgeon: Willis Modena, MD;  Location: [facility];  Service: Endoscopy;  Laterality: N/A;  . COLONOSCOPY  11/17/2011   Procedure: COLONOSCOPY;  Surgeon: Charna Elizabeth, MD;  Location: [facility];  Service: Endoscopy;  Laterality: N/A;  . SHOULDER SURGERY     right      OBJECTIVE:  Vitals:   05/03/20 1957  BP: (!) 152/65  Pulse: 73  Resp: 16  Temp: 98.1 F (36.7 C)  SpO2: 98%    General appearance: alert; no  distress HEENT: [place]; AT Neck: supple with FROM Resp: unlabored respirations Extremities: . R thumb: warm with well perfused appearance; distal TTP without swelling; approx 4 mm yellowish area under nail CV: brisk extremity capillary refill of RUE; 2+ radial pulse of RUE. Skin: warm and dry; no visible rashes Neurologic: gait normal; normal sensation and strength of RUE Psychological: alert and cooperative; normal mood and affect     Allergies  Allergen Reactions  . Ace Inhibitors Swelling    Past Medical History:  Diagnosis Date  . Diabetes mellitus (HCC)   . Hyperlipidemia   . Hypertension   . Hypothyroidism   . OSA (obstructive sleep apnea)    Social History   Socioeconomic History  . Marital status: Married    Spouse name: Not on file  . Number of children: Not on file  . Years of education: Not on file  . Highest education level: Not on file  Occupational History  . Occupation: Academic librarian: [employer]  Tobacco Use  . Smoking status: Former Smoker    Packs/day: 1.00    Years: 30.00    Pack years: 30.00    Types: Cigarettes    Quit date: 10/02/2010    Years since quitting: 9.6  . Smokeless tobacco: Never Used  Vaping Use  . Vaping Use: Never used  Substance and Sexual Activity  . Alcohol use: No  . Drug use: Never  . Sexual activity: Not on file  Other Topics Concern  .  Not on file  Social History Narrative  . Not on file   Social Determinants of Health   Financial Resource Strain:   . Difficulty of Paying Living Expenses: Not on file  Food Insecurity:   . Worried About Programme researcher, broadcasting/film/video in the Last Year: Not on file  . Ran Out of Food in the Last Year: Not on file  Transportation Needs:   . Lack of Transportation (Medical): Not on file  . Lack of Transportation (Non-Medical): Not on file  Physical Activity:   . Days of Exercise per Week: Not on file  . Minutes of Exercise per Session: Not on file  Stress:   . Feeling of Stress : Not on  file  Social Connections:   . Frequency of Communication with Friends and Family: Not on file  . Frequency of Social Gatherings with Friends and Family: Not on file  . Attends Religious Services: Not on file  . Active Member of Clubs or Organizations: Not on file  . Attends Banker Meetings: Not on file  . Marital Status: Not on file   Family History  Problem Relation Age of Onset  . Heart disease Mother   . Heart disease Father   . Brain cancer Brother   . Lung cancer Sister    Past Surgical History:  Procedure Laterality Date  . ANAL RECTAL MANOMETRY N/A 04/27/2019   Procedure: ANO RECTAL MANOMETRY/balloon expulsion;  Surgeon: Willis Modena, MD;  Location: [facility];  Service: Endoscopy;  Laterality: N/A;  . COLONOSCOPY  11/17/2011   Procedure: COLONOSCOPY;  Surgeon: Charna Elizabeth, MD;  Location: [facility];  Service: Endoscopy;  Laterality: N/A;  . SHOULDER SURGERY     right      Mardella Layman, MD 05/08/20 [phone]

## 2020-05-09 ENCOUNTER — Encounter: Payer: Self-pay | Admitting: Pulmonary Disease

## 2020-05-09 ENCOUNTER — Ambulatory Visit (INDEPENDENT_AMBULATORY_CARE_PROVIDER_SITE_OTHER): Payer: No Typology Code available for payment source | Admitting: Pulmonary Disease

## 2020-05-09 ENCOUNTER — Other Ambulatory Visit: Payer: Self-pay

## 2020-05-09 VITALS — BP 128/78 | HR 71 | Temp 97.1°F | Ht 67.0 in | Wt 203.8 lb

## 2020-05-09 DIAGNOSIS — G4733 Obstructive sleep apnea (adult) (pediatric): Secondary | ICD-10-CM

## 2020-05-09 DIAGNOSIS — Z9989 Dependence on other enabling machines and devices: Secondary | ICD-10-CM | POA: Diagnosis not present

## 2020-05-09 NOTE — Progress Notes (Signed)
[facility]  Chief Complaint  Patient presents with  . Follow-up    pt is here for cpap compliance    Constitutional:  BP 128/78 (BP Location: Left Arm, Cuff Size: Normal)   Pulse 71   Temp (!) 97.1 F (36.2 C)   Ht _0  (1.702 m)   Wt 203 lb 12.8 oz (92.4 kg)   SpO2 98%   BMI 31.92 kg/m   Past Medical History:  Hypothyroidism, HTN, HLD, DM  Past Surgical History:  Her  has a past surgical history that includes Shoulder surgery; Colonoscopy (11/17/2011); and Anal Rectal manometry (N/A, 04/27/2019).  Brief Summary:  Brenda Meyer is a 59 y.o. female with obstructive sleep apnea.      Subjective:  Uses CPAP nightly.  Has full face mask.  No issue with mask fit.  Not having dry mouth, sore throat, or aerophagia.  Had trouble with hay fever over past few weeks, but has been able to keep up with CPAP.  Lost about 20 lbs since being started on jardiance for diabetes.  Physical Exam:   Appearance - well kempt   ENMT - no sinus tenderness, no oral exudate, no LAN, Mallampati 4 airway, no stridor  Respiratory - equal breath sounds bilaterally, no wheezing or rales  CV - s1s2 regular rate and rhythm, no murmurs  Ext - no clubbing, no edema  Skin - no rashes  Psych - normal mood and affect   Sleep Tests:  PSG 2012 >> AHI 29 Auto CPAP 04/08/20 to 05/07/20 >> used on 30 of 30 nights with average 8 hrs 55 min.  Average AHI 1.7 with median CPAP 10 and 95 th percentile CPAP 13 cm H2O  Social History:  She  reports that she quit smoking about 9 years ago. Her smoking use included cigarettes. She has a 30.00 pack-year smoking history. She has never used smokeless tobacco. She reports that she does not drink alcohol and does not use drugs.  Family History:  Her family history includes Brain cancer in her brother; Heart disease in her father and mother; Lung cancer in her sister.     Assessment/Plan:   Obstructive sleep apnea. -  she is compliant with CPAP - she uses [employer] for her DME - continue auto CPAP 5 to 15 cm H2O - she might be eligible for a new machine in 2022  Time Spent Involved in Patient Care on Day of Examination:  21 minutes  Follow up:  Patient Instructions  Follow up in 1 year   Medication List:   Allergies as of 05/09/2020      Reactions   Ace Inhibitors Swelling      Medication List       Accurate as of May 09, 2020  9:15 AM. If you have any questions, ask your nurse or doctor.        STOP taking these medications   Lactulose 20 GM/30ML Soln Stopped by: Chesley Mires, MD     TAKE these medications   aspirin EC 81 MG tablet Take 1 tablet (81 mg total) by mouth daily.   atorvastatin 20 MG tablet Commonly known as: LIPITOR TAKE 1 TABLET BY MOUTH ONCE DAILY   Blood Pressure Kit Devi 1 kit by Does not apply route daily.   CALCIUM + D PO Take 600 mg by mouth 2 (two) times daily.   cyclobenzaprine 10 MG tablet Commonly known as: FLEXERIL Take 1 tablet (10 mg total)  by mouth at bedtime.   diphenhydrAMINE 25 MG tablet Commonly known as: BENADRYL Take 25 mg by mouth at bedtime as needed for sleep.   doxycycline 100 MG capsule Commonly known as: VIBRAMYCIN Take 1 capsule (100 mg total) by mouth 2 (two) times daily.   DULoxetine 60 MG capsule Commonly known as: Cymbalta Take 1 capsule (60 mg total) by mouth daily.   glucose blood test strip Commonly known as: Civil engineer, contracting Use as instructed   Jardiance 10 MG Tabs tablet Generic drug: empagliflozin TAKE 1 TABLET BY MOUTH DAILY. (NEEDS TO BE SEEN BEFORE NEXT REFILL)   levothyroxine 50 MCG tablet Commonly known as: SYNTHROID Take 1 tablet (50 mcg total) by mouth daily.   linaclotide 290 MCG Caps capsule Commonly known as: Linzess TAKE 1 CAPSULE BY MOUTH ONCE DAILY BEFORE BREAKFAST   losartan 50 MG tablet Commonly known as: COZAAR Take 1 tablet (50 mg total) by mouth daily.   metFORMIN 1000 MG  tablet Commonly known as: GLUCOPHAGE Take 1 tablet (1,000 mg total) by mouth 2 (two) times daily with a meal.   nystatin cream Commonly known as: MYCOSTATIN Apply 1 application topically 2 (two) times daily.   onetouch ultrasoft lancets Use as instructed   OneTouch Verio w/Device Kit 1 kit by Does not apply route daily.   pantoprazole 20 MG tablet Commonly known as: PROTONIX Take 1 tablet (20 mg total) by mouth daily. Needs appointment for further refills   Vitamin D (Ergocalciferol) 1.25 MG (50000 UNIT) Caps capsule Commonly known as: DRISDOL TAKE 1 CAPSULE BY MOUTH EVERY 7 DAYS.       Signature:  Chesley Mires, MD [place] Pager - [phone] 05/09/2020, 9:15 AM

## 2020-05-09 NOTE — Patient Instructions (Signed)
Follow up in 1 year.

## 2020-05-24 ENCOUNTER — Other Ambulatory Visit: Payer: Self-pay

## 2020-05-24 ENCOUNTER — Encounter: Payer: Self-pay | Admitting: Family

## 2020-05-24 ENCOUNTER — Ambulatory Visit (INDEPENDENT_AMBULATORY_CARE_PROVIDER_SITE_OTHER): Payer: No Typology Code available for payment source | Admitting: Family

## 2020-05-24 VITALS — BP 159/66 | HR 65 | Temp 97.2°F | Ht 67.0 in | Wt 208.6 lb

## 2020-05-24 DIAGNOSIS — E785 Hyperlipidemia, unspecified: Secondary | ICD-10-CM

## 2020-05-24 DIAGNOSIS — Z23 Encounter for immunization: Secondary | ICD-10-CM

## 2020-05-24 DIAGNOSIS — F329 Major depressive disorder, single episode, unspecified: Secondary | ICD-10-CM

## 2020-05-24 DIAGNOSIS — E1169 Type 2 diabetes mellitus with other specified complication: Secondary | ICD-10-CM | POA: Diagnosis not present

## 2020-05-24 DIAGNOSIS — I10 Essential (primary) hypertension: Secondary | ICD-10-CM

## 2020-05-24 DIAGNOSIS — E039 Hypothyroidism, unspecified: Secondary | ICD-10-CM

## 2020-05-24 DIAGNOSIS — I700 Atherosclerosis of aorta: Secondary | ICD-10-CM | POA: Diagnosis not present

## 2020-05-24 DIAGNOSIS — G8929 Other chronic pain: Secondary | ICD-10-CM

## 2020-05-24 DIAGNOSIS — E669 Obesity, unspecified: Secondary | ICD-10-CM

## 2020-05-24 DIAGNOSIS — F32A Depression, unspecified: Secondary | ICD-10-CM

## 2020-05-24 DIAGNOSIS — K5904 Chronic idiopathic constipation: Secondary | ICD-10-CM

## 2020-05-24 DIAGNOSIS — E559 Vitamin D deficiency, unspecified: Secondary | ICD-10-CM

## 2020-05-24 DIAGNOSIS — F419 Anxiety disorder, unspecified: Secondary | ICD-10-CM

## 2020-05-24 DIAGNOSIS — G4733 Obstructive sleep apnea (adult) (pediatric): Secondary | ICD-10-CM

## 2020-05-24 DIAGNOSIS — M546 Pain in thoracic spine: Secondary | ICD-10-CM

## 2020-05-24 LAB — BAYER DCA HB A1C WAIVED: HB A1C (BAYER DCA - WAIVED): 7 % — ABNORMAL HIGH (ref <7.0)

## 2020-05-24 LAB — CMP14+EGFR
ALT: 8 IU/L (ref 0–32)
AST: 9 IU/L (ref 0–40)
Albumin/Globulin Ratio: 1.8 (ref 1.2–2.2)
Albumin: 4.2 g/dL (ref 3.8–4.9)
Alkaline Phosphatase: 140 IU/L — ABNORMAL HIGH (ref 44–121)
BUN/Creatinine Ratio: 8 — ABNORMAL LOW (ref 12–28)
BUN: 6 mg/dL — ABNORMAL LOW (ref 8–27)
Bilirubin Total: 0.3 mg/dL (ref 0.0–1.2)
CO2: 23 mmol/L (ref 20–29)
Calcium: 9.9 mg/dL (ref 8.7–10.3)
Chloride: 98 mmol/L (ref 96–106)
Creatinine, Ser: 0.71 mg/dL (ref 0.57–1.00)
GFR calc Af Amer: 107 mL/min/{1.73_m2} (ref >59)
GFR calc non Af Amer: 93 mL/min/{1.73_m2} (ref >59)
Globulin, Total: 2.4 g/dL (ref 1.5–4.5)
Glucose: 115 mg/dL — ABNORMAL HIGH (ref 65–99)
Potassium: 4.6 mmol/L (ref 3.5–5.2)
Sodium: 137 mmol/L (ref 134–144)
Total Protein: 6.6 g/dL (ref 6.0–8.5)

## 2020-05-24 NOTE — Patient Instructions (Signed)

## 2020-05-24 NOTE — Progress Notes (Signed)
Subjective:    Patient ID: Brenda Meyer, female    DOB: 1961/05/15, 59 y.o.   MRN: [id number]  Chief Complaint  Patient presents with  . Diabetes  . Hypertension  . Nail Problem    infection went to urgent care put on doxy still not all the way better    Pt presents to the office today for chronic follow up. Followed by GI for chronic constipation and Ileus. Pt is followed by Pulmonologist annually for CPAP. Followed by GYN annually for pap and mammogram.   Pt fell a few weeks ago, and hit her thumb. She went to Urgent and had a hole drilled in her nail. Pt states she continues to have intermittent pain.  Diabetes She presents for her follow-up diabetic visit. She has type 2 diabetes mellitus. Her disease course has been fluctuating. Pertinent negatives for diabetes include no blurred vision and no foot paresthesias. There are no hypoglycemic complications. Symptoms are stable. Pertinent negatives for diabetic complications include no CVA, heart disease or peripheral neuropathy. Risk factors for coronary artery disease include post-menopausal, sedentary lifestyle, hypertension, diabetes mellitus and dyslipidemia. She is following a generally healthy diet. Her overall blood glucose range is 110-130 mg/dl. An ACE inhibitor/angiotensin II receptor blocker is being taken. Eye exam is current.  Hypertension This is a chronic problem. The current episode started more than 1 year ago. The problem has been waxing and waning since onset. The problem is uncontrolled. Associated symptoms include anxiety. Pertinent negatives include no blurred vision, malaise/fatigue, peripheral edema or shortness of breath. Risk factors for coronary artery disease include dyslipidemia, obesity and sedentary lifestyle. The current treatment provides moderate improvement. There is no history of CVA. Identifiable causes of hypertension include a thyroid problem.  Anxiety Presents for follow-up visit. Symptoms include  depressed mood, excessive worry, irritability, panic and restlessness. Patient reports no shortness of breath. Symptoms occur occasionally. The severity of symptoms is moderate. The quality of sleep is good.    Depression        This is a chronic problem.  The current episode started more than 1 year ago.   The problem occurs intermittently.  Associated symptoms include irritable, restlessness and sad.  Associated symptoms include no helplessness and no hopelessness.  Past treatments include SSRIs - Selective serotonin reuptake inhibitors.  Compliance with treatment is good.  Past medical history includes thyroid problem and anxiety.   Back Pain This is a chronic problem. The current episode started more than 1 year ago. The problem occurs intermittently. The problem has been waxing and waning since onset. The pain is present in the lumbar spine. The pain is mild.  Constipation This is a chronic problem. The current episode started more than 1 year ago. The problem has been waxing and waning since onset. Associated symptoms include back pain. Pertinent negatives include no fecal incontinence or vomiting. Risk factors include obesity. She has tried laxatives for the symptoms. The treatment provided moderate relief.  Thyroid Problem Presents for follow-up visit. Symptoms include constipation and depressed mood. The symptoms have been stable.  OSA Uses CPAP nightly. Stable.     Review of Systems  Constitutional: Positive for irritability. Negative for malaise/fatigue.  Eyes: Negative for blurred vision.  Respiratory: Negative for shortness of breath.   Gastrointestinal: Positive for constipation. Negative for vomiting.  Musculoskeletal: Positive for back pain.  Psychiatric/Behavioral: Positive for depression.  All other systems reviewed and are negative.      Objective:   Physical Exam Vitals  reviewed.  Constitutional:      General: She is irritable. She is not in acute distress.     Appearance: She is well-developed.  HENT:     Head: Normocephalic and atraumatic.     Right Ear: Tympanic membrane normal.     Left Ear: Tympanic membrane normal.  Eyes:     Pupils: Pupils are equal, round, and reactive to light.  Neck:     Thyroid: No thyromegaly.  Cardiovascular:     Rate and Rhythm: Normal rate and regular rhythm.     Heart sounds: Normal heart sounds. No murmur heard.   Pulmonary:     Effort: Pulmonary effort is normal. No respiratory distress.     Breath sounds: Normal breath sounds. No wheezing.  Abdominal:     General: Bowel sounds are normal. There is no distension.     Palpations: Abdomen is soft.     Tenderness: There is no abdominal tenderness.  Musculoskeletal:        General: No tenderness. Normal range of motion.     Cervical back: Normal range of motion and neck supple.  Skin:    General: Skin is warm and dry.     Findings: Bruising (right thumb nail) present.  Neurological:     Mental Status: She is alert and oriented to person, place, and time.     Cranial Nerves: No cranial nerve deficit.     Deep Tendon Reflexes: Reflexes are normal and symmetric.  Psychiatric:        Behavior: Behavior normal.        Thought Content: Thought content normal.        Judgment: Judgment normal.       BP (!) 159/66   Pulse 65   Temp (!) 97.2 F (36.2 C) (Temporal)   Ht 5' 7"  (1.702 m)   Wt 208 lb 9.6 oz (94.6 kg)   SpO2 100%   BMI 32.67 kg/m      Assessment & Plan:  Brenda Meyer comes in today with chief complaint of Diabetes, Hypertension, and Nail Problem (infection went to urgent care put on doxy still not all the way better )   Diagnosis and orders addressed:  1. Type 2 diabetes mellitus with other specified complication, without long-term current use of insulin (HCC) - Bayer DCA Hb A1c Waived - CMP14+EGFR  2. Need for immunization against influenza - Flu Vaccine QUAD 36+ mos IM - CMP14+EGFR  3. Atherosclerosis of aorta (HCC) -  CMP14+EGFR  4. Essential hypertension - CMP14+EGFR  5. OSA (obstructive sleep apnea) - CMP14+EGFR  6. Hypothyroidism, unspecified type - CMP14+EGFR  7. Anxiety and depression - CMP14+EGFR  8. Chronic midline thoracic back pain - CMP14+EGFR  9. Chronic idiopathic constipation - CMP14+EGFR  10. Obesity (BMI 30-39.9) - CMP14+EGFR  11. Hyperlipidemia, unspecified hyperlipidemia type - CMP14+EGFR  12. Vitamin D deficiency - CMP14+EGFR   Labs pending Health Maintenance reviewed Diet and exercise encouraged  Follow up plan: 4 months    Evelina Dun, FNP

## 2020-05-26 MED FILL — METFORMIN HCL 1000 MG TABS: 1000 | 90 days supply | Qty: 180 | Fill #2

## 2020-05-26 MED FILL — LOSARTAN POTASSIUM 50 MG TA: 50 | 90 days supply | Qty: 90 | Fill #2

## 2020-06-01 ENCOUNTER — Ambulatory Visit: Payer: No Typology Code available for payment source | Admitting: Family

## 2020-06-09 MED FILL — VIT D2 1.25 MG (50,000 UNIT: 1.25 MG | 84 days supply | Qty: 12 | Fill #2

## 2020-06-09 MED FILL — JARDIANCE 10 MG TABLET: 10 | 90 days supply | Qty: 90 | Fill #2

## 2020-06-20 ENCOUNTER — Ambulatory Visit (INDEPENDENT_AMBULATORY_CARE_PROVIDER_SITE_OTHER): Payer: No Typology Code available for payment source

## 2020-06-20 ENCOUNTER — Other Ambulatory Visit: Payer: Self-pay

## 2020-06-20 DIAGNOSIS — Z23 Encounter for immunization: Secondary | ICD-10-CM

## 2020-06-20 NOTE — Progress Notes (Signed)
   Covid-19 Vaccination Clinic  Name:  Brenda Meyer    MRN: [id number] DOB: [birth year]  06/20/2020  [name] was observed post Covid-19 immunization for 15 minutes without incident. She was provided with Vaccine Information Sheet and instruction to access the V-Safe system.   [name] was instructed to call 911 with any severe reactions post vaccine: Marland Kitchen Difficulty breathing  . Swelling of face and throat  . A fast heartbeat  . A bad rash all over body  . Dizziness and weakness

## 2020-06-23 ENCOUNTER — Other Ambulatory Visit: Payer: Self-pay | Admitting: Family

## 2020-06-23 MED FILL — DULOXETINE HCL 60 MG CPEP: 60 | 90 days supply | Qty: 90 | Fill #2

## 2020-06-25 ENCOUNTER — Other Ambulatory Visit: Payer: Self-pay | Admitting: Family

## 2020-06-25 MED FILL — PANTOPRAZOLE SOD DR 20 MG T: 20 | 90 days supply | Qty: 90 | Fill #0

## 2020-07-18 ENCOUNTER — Other Ambulatory Visit: Payer: Self-pay | Admitting: Family

## 2020-07-18 DIAGNOSIS — E785 Hyperlipidemia, unspecified: Secondary | ICD-10-CM

## 2020-07-18 DIAGNOSIS — M546 Pain in thoracic spine: Secondary | ICD-10-CM

## 2020-07-18 DIAGNOSIS — G8929 Other chronic pain: Secondary | ICD-10-CM

## 2020-07-18 DIAGNOSIS — E7800 Pure hypercholesterolemia, unspecified: Secondary | ICD-10-CM

## 2020-07-18 MED ORDER — CYCLOBENZAPRINE HCL 10 MG PO TABS: 10.0000 mg | ORAL_TABLET | Freq: Every day | ORAL | 2 refills | Status: DC

## 2020-07-18 MED FILL — CYCLOBENZAPRINE HCL 10 MG T: 10 | 90 days supply | Qty: 90 | Fill #0

## 2020-07-18 MED FILL — LEVOTHYROXINE 50 MCG TABLET: 50 | 90 days supply | Qty: 90 | Fill #2

## 2020-07-18 MED FILL — LINZESS 290 MCG CAPSULE: 290 | 90 days supply | Qty: 90 | Fill #0

## 2020-07-18 NOTE — Addendum Note (Signed)
Addended by: Julious Payer D on: 07/18/2020 11:52 AM   Modules accepted: Orders

## 2020-07-21 MED FILL — ATORVASTATIN CALCIUM 20 MG: 20 | 90 days supply | Qty: 90 | Fill #0

## 2020-08-18 MED FILL — METFORMIN HCL 1000 MG TABS: 1000 | 90 days supply | Qty: 180 | Fill #3

## 2020-08-18 MED FILL — LOSARTAN POTASSIUM 50 MG TA: 50 | 90 days supply | Qty: 90 | Fill #3

## 2020-09-12 MED FILL — JARDIANCE 10 MG TABLET: 10 | 90 days supply | Qty: 90 | Fill #3

## 2020-09-12 MED FILL — DULOXETINE HCL 60 MG CPEP: 60 | 90 days supply | Qty: 90 | Fill #3

## 2020-09-12 MED FILL — VIT D2 1.25 MG (50,000 UNIT: 1.25 MG | 84 days supply | Qty: 12 | Fill #3

## 2020-09-30 DIAGNOSIS — G4733 Obstructive sleep apnea (adult) (pediatric): Secondary | ICD-10-CM

## 2020-09-30 DIAGNOSIS — Z9989 Dependence on other enabling machines and devices: Secondary | ICD-10-CM

## 2020-10-17 ENCOUNTER — Other Ambulatory Visit: Payer: Self-pay | Admitting: Family

## 2020-10-17 MED FILL — LINZESS 290 MCG CAPSULE: 290 | 90 days supply | Qty: 90 | Fill #0

## 2020-10-17 MED FILL — LEVOTHYROXINE 50 MCG TABLET: 50 | 90 days supply | Qty: 90 | Fill #3

## 2020-10-17 MED FILL — CYCLOBENZAPRINE HCL 10 MG T: 10 | 90 days supply | Qty: 90 | Fill #1

## 2020-10-27 ENCOUNTER — Other Ambulatory Visit: Payer: Self-pay | Admitting: Family

## 2020-10-27 DIAGNOSIS — E7800 Pure hypercholesterolemia, unspecified: Secondary | ICD-10-CM

## 2020-10-27 DIAGNOSIS — E785 Hyperlipidemia, unspecified: Secondary | ICD-10-CM

## 2020-10-29 ENCOUNTER — Other Ambulatory Visit: Payer: Self-pay | Admitting: Family

## 2020-10-29 MED FILL — ATORVASTATIN CALCIUM 20 MG: 20 | 90 days supply | Qty: 90 | Fill #0

## 2020-10-31 MED FILL — LOSARTAN POTASSIUM 50 MG TA: 50 | 90 days supply | Qty: 90 | Fill #4

## 2020-11-20 ENCOUNTER — Other Ambulatory Visit (HOSPITAL_BASED_OUTPATIENT_CLINIC_OR_DEPARTMENT_OTHER): Payer: Self-pay

## 2020-11-21 ENCOUNTER — Other Ambulatory Visit: Payer: Self-pay | Admitting: Family

## 2020-11-21 DIAGNOSIS — E119 Type 2 diabetes mellitus without complications: Secondary | ICD-10-CM

## 2020-11-22 ENCOUNTER — Other Ambulatory Visit: Payer: Self-pay | Admitting: Family

## 2020-11-22 MED FILL — METFORMIN HCL 1000 MG TABS: 1000 | 90 days supply | Qty: 180 | Fill #0

## 2020-11-23 ENCOUNTER — Ambulatory Visit: Payer: No Typology Code available for payment source | Admitting: Family

## 2020-11-30 ENCOUNTER — Other Ambulatory Visit: Payer: Self-pay | Admitting: Family

## 2020-11-30 DIAGNOSIS — E559 Vitamin D deficiency, unspecified: Secondary | ICD-10-CM

## 2020-11-30 MED ORDER — VITAMIN D (ERGOCALCIFEROL) 1.25 MG (50000 UNIT) PO CAPS: ORAL_CAPSULE | ORAL | 0 refills | Status: DC

## 2020-11-30 MED ORDER — EMPAGLIFLOZIN 10 MG PO TABS: ORAL_TABLET | ORAL | 0 refills | Status: DC

## 2020-11-30 MED FILL — JARDIANCE 10 MG TABLET: 10 | 90 days supply | Qty: 90 | Fill #0

## 2020-12-01 ENCOUNTER — Other Ambulatory Visit (HOSPITAL_COMMUNITY): Payer: Self-pay

## 2020-12-01 MED FILL — Ergocalciferol Cap 1.25 MG (50000 Unit): ORAL | 84 days supply | Qty: 12 | Fill #0 | Status: AC

## 2020-12-02 ENCOUNTER — Other Ambulatory Visit (HOSPITAL_COMMUNITY): Payer: Self-pay

## 2020-12-03 ENCOUNTER — Other Ambulatory Visit (HOSPITAL_COMMUNITY): Payer: Self-pay

## 2020-12-16 MED FILL — Pantoprazole Sodium EC Tab 20 MG (Base Equiv): ORAL | 90 days supply | Qty: 90 | Fill #0 | Status: AC

## 2020-12-17 ENCOUNTER — Other Ambulatory Visit (HOSPITAL_COMMUNITY): Payer: Self-pay

## 2020-12-17 MED ORDER — DULOXETINE HCL 60 MG PO CPEP
ORAL_CAPSULE | Freq: Every day | ORAL | 1 refills | Status: DC
  Filled 2020-12-17: qty 90, 90d supply, fill #0

## 2020-12-19 ENCOUNTER — Other Ambulatory Visit: Payer: Self-pay | Admitting: Family Medicine

## 2020-12-19 DIAGNOSIS — E039 Hypothyroidism, unspecified: Secondary | ICD-10-CM

## 2020-12-19 DIAGNOSIS — E785 Hyperlipidemia, unspecified: Secondary | ICD-10-CM

## 2020-12-19 DIAGNOSIS — E7800 Pure hypercholesterolemia, unspecified: Secondary | ICD-10-CM

## 2020-12-19 DIAGNOSIS — G8929 Other chronic pain: Secondary | ICD-10-CM

## 2020-12-19 DIAGNOSIS — E119 Type 2 diabetes mellitus without complications: Secondary | ICD-10-CM

## 2020-12-19 DIAGNOSIS — I10 Essential (primary) hypertension: Secondary | ICD-10-CM

## 2020-12-19 DIAGNOSIS — M546 Pain in thoracic spine: Secondary | ICD-10-CM

## 2020-12-21 ENCOUNTER — Ambulatory Visit (INDEPENDENT_AMBULATORY_CARE_PROVIDER_SITE_OTHER): Payer: No Typology Code available for payment source | Admitting: Family

## 2020-12-21 ENCOUNTER — Encounter: Payer: Self-pay | Admitting: Family

## 2020-12-21 ENCOUNTER — Other Ambulatory Visit: Payer: Self-pay

## 2020-12-21 ENCOUNTER — Other Ambulatory Visit (HOSPITAL_COMMUNITY): Payer: Self-pay

## 2020-12-21 VITALS — BP 131/57 | HR 69 | Temp 97.7°F | Ht 67.0 in | Wt 207.2 lb

## 2020-12-21 DIAGNOSIS — G8929 Other chronic pain: Secondary | ICD-10-CM

## 2020-12-21 DIAGNOSIS — I10 Essential (primary) hypertension: Secondary | ICD-10-CM

## 2020-12-21 DIAGNOSIS — E119 Type 2 diabetes mellitus without complications: Secondary | ICD-10-CM

## 2020-12-21 DIAGNOSIS — M546 Pain in thoracic spine: Secondary | ICD-10-CM

## 2020-12-21 DIAGNOSIS — M5412 Radiculopathy, cervical region: Secondary | ICD-10-CM | POA: Diagnosis not present

## 2020-12-21 DIAGNOSIS — E7800 Pure hypercholesterolemia, unspecified: Secondary | ICD-10-CM | POA: Diagnosis not present

## 2020-12-21 DIAGNOSIS — M25511 Pain in right shoulder: Secondary | ICD-10-CM | POA: Diagnosis not present

## 2020-12-21 DIAGNOSIS — K5904 Chronic idiopathic constipation: Secondary | ICD-10-CM

## 2020-12-21 DIAGNOSIS — E559 Vitamin D deficiency, unspecified: Secondary | ICD-10-CM

## 2020-12-21 DIAGNOSIS — E785 Hyperlipidemia, unspecified: Secondary | ICD-10-CM

## 2020-12-21 DIAGNOSIS — I700 Atherosclerosis of aorta: Secondary | ICD-10-CM | POA: Diagnosis not present

## 2020-12-21 DIAGNOSIS — E039 Hypothyroidism, unspecified: Secondary | ICD-10-CM

## 2020-12-21 DIAGNOSIS — E669 Obesity, unspecified: Secondary | ICD-10-CM

## 2020-12-21 DIAGNOSIS — G4733 Obstructive sleep apnea (adult) (pediatric): Secondary | ICD-10-CM

## 2020-12-21 LAB — BAYER DCA HB A1C WAIVED: HB A1C (BAYER DCA - WAIVED): 7.1 % — ABNORMAL HIGH (ref <7.0)

## 2020-12-21 MED ORDER — LIDOCAINE-EPINEPHRINE 2 %-1:100000 IJ SOLN: 1.0000 mL | Freq: Once | INTRAMUSCULAR | Status: DC

## 2020-12-21 MED ORDER — EMPAGLIFLOZIN 10 MG PO TABS
ORAL_TABLET | ORAL | 0 refills | Status: DC
  Filled 2020-12-21 – 2021-03-02 (×2): qty 90, 90d supply, fill #0

## 2020-12-21 MED ORDER — LINACLOTIDE 290 MCG PO CAPS
ORAL_CAPSULE | ORAL | 0 refills | Status: DC
  Filled 2020-12-21: qty 90, 90d supply, fill #0

## 2020-12-21 MED ORDER — DULOXETINE HCL 60 MG PO CPEP
60.0000 mg | ORAL_CAPSULE | Freq: Every day | ORAL | 1 refills | Status: DC
  Filled 2020-12-21 – 2021-03-16 (×2): qty 90, 90d supply, fill #0
  Filled 2021-06-12: qty 90, 90d supply, fill #1

## 2020-12-21 MED ORDER — LOSARTAN POTASSIUM 50 MG PO TABS
ORAL_TABLET | Freq: Every day | ORAL | 4 refills | Status: DC
  Filled 2020-12-21 – 2021-02-19 (×2): qty 90, 90d supply, fill #0
  Filled 2021-04-18: qty 90, 90d supply, fill #1
  Filled 2021-08-02: qty 90, 90d supply, fill #2
  Filled 2021-10-26: qty 90, 90d supply, fill #3

## 2020-12-21 MED ORDER — LUBIPROSTONE 24 MCG PO CAPS
24.0000 ug | ORAL_CAPSULE | Freq: Two times a day (BID) | ORAL | 2 refills | Status: DC
  Filled 2020-12-21: qty 180, 90d supply, fill #0
  Filled 2021-04-18: qty 90, 45d supply, fill #1

## 2020-12-21 MED ORDER — METFORMIN HCL 1000 MG PO TABS
ORAL_TABLET | Freq: Two times a day (BID) | ORAL | 0 refills | Status: DC
  Filled 2020-12-21 – 2021-02-19 (×2): qty 180, 90d supply, fill #0

## 2020-12-21 MED ORDER — BETAMETHASONE SOD PHOS & ACET 30 MG/5ML IJ KIT: 6.0000 mg | PACK | Freq: Once | INTRAMUSCULAR | Status: DC

## 2020-12-21 MED ORDER — LEVOTHYROXINE SODIUM 50 MCG PO TABS
ORAL_TABLET | Freq: Every day | ORAL | 3 refills | Status: DC
  Filled 2020-12-21: qty 90, 90d supply, fill #0
  Filled 2021-04-19: qty 90, 90d supply, fill #1
  Filled 2021-07-05: qty 90, 90d supply, fill #2
  Filled 2021-10-02: qty 90, 90d supply, fill #3

## 2020-12-21 MED ORDER — CYCLOBENZAPRINE HCL 10 MG PO TABS
10.0000 mg | ORAL_TABLET | Freq: Every day | ORAL | 2 refills | Status: DC
Start: 2020-12-21 — End: 2021-10-02
  Filled 2020-12-21: qty 90, 90d supply, fill #0
  Filled 2021-03-16: qty 90, 90d supply, fill #1
  Filled 2021-07-05: qty 90, 90d supply, fill #2

## 2020-12-21 MED ORDER — VITAMIN D (ERGOCALCIFEROL) 1.25 MG (50000 UNIT) PO CAPS
ORAL_CAPSULE | ORAL | 0 refills | Status: DC
  Filled 2020-12-21 – 2021-02-19 (×2): qty 12, 84d supply, fill #0

## 2020-12-21 MED ORDER — PANTOPRAZOLE SODIUM 20 MG PO TBEC
DELAYED_RELEASE_TABLET | Freq: Every day | ORAL | 1 refills | Status: DC
  Filled 2020-12-21 – 2021-07-05 (×2): qty 90, 90d supply, fill #0
  Filled 2021-11-20: qty 90, 90d supply, fill #1

## 2020-12-21 MED ORDER — ATORVASTATIN CALCIUM 20 MG PO TABS
ORAL_TABLET | Freq: Every day | ORAL | 0 refills | Status: DC
  Filled 2020-12-21 – 2021-01-19 (×2): qty 90, 90d supply, fill #0

## 2020-12-21 NOTE — Progress Notes (Addendum)
Subjective:    Patient ID: Brenda Meyer, female    DOB: [birth year], 60 y.o.   MRN: [id number]  Chief Complaint  Patient presents with  . Diabetes    Fasting no concerns   . Anxiety   Pt presents to the office today for chronic follow up. Pt is followed by Pulmonologist annually for CPAP. Followed by GYN annually for pap and mammogram. Diabetes She presents for her follow-up diabetic visit. She has type 2 diabetes mellitus. Her disease course has been stable. Associated symptoms include fatigue. Pertinent negatives for diabetes include no blurred vision and no foot paresthesias. There are no hypoglycemic complications. Symptoms are stable. There are no diabetic complications. Risk factors for coronary artery disease include dyslipidemia, diabetes mellitus, hypertension and sedentary lifestyle. She is following a generally healthy diet. Her overall blood glucose range is 110-130 mg/dl. An ACE inhibitor/angiotensin II receptor blocker is being taken. Eye exam is current.  Anxiety Presents for follow-up visit. Patient reports no shortness of breath.    Hypertension This is a chronic problem. The current episode started more than 1 year ago. The problem has been resolved since onset. Associated symptoms include anxiety and peripheral edema. Pertinent negatives include no blurred vision, malaise/fatigue or shortness of breath. Risk factors for coronary artery disease include diabetes mellitus, sedentary lifestyle and dyslipidemia. The current treatment provides moderate improvement. Identifiable causes of hypertension include a thyroid problem.  Thyroid Problem Presents for follow-up visit. Symptoms include constipation and fatigue. Patient reports no dry skin or hoarse voice. The symptoms have been stable. Her past medical history is significant for hyperlipidemia.  Hyperlipidemia This is a chronic problem. The current episode started more than 1 year ago. The problem is controlled. Recent  lipid tests were reviewed and are high. Exacerbating diseases include obesity. Pertinent negatives include no shortness of breath. Current antihyperlipidemic treatment includes statins. The current treatment provides moderate improvement of lipids. Risk factors for coronary artery disease include dyslipidemia, diabetes mellitus, hypertension, a sedentary lifestyle and post-menopausal.  Constipation This is a chronic problem. The current episode started more than 1 year ago. Her stool frequency is 4 to 5 times per week. Risk factors include obesity. She has tried laxatives and diet changes for the symptoms. The treatment provided mild relief.  Shoulder Pain  The pain is present in the left shoulder. This is a chronic problem. The current episode started more than 1 year ago. The problem occurs intermittently. The pain is at a severity of 4/10. Associated symptoms include numbness (in ring and pinky finger) and tingling. The treatment provided mild relief.  OSA Uses CPAP nightly. Stable.     Review of Systems  Constitutional: Positive for fatigue. Negative for malaise/fatigue.  HENT: Negative for hoarse voice.   Eyes: Negative for blurred vision.  Respiratory: Negative for shortness of breath.   Gastrointestinal: Positive for constipation.  Neurological: Positive for tingling and numbness (in ring and pinky finger).  All other systems reviewed and are negative.      Objective:   Physical Exam Vitals reviewed.  Constitutional:      General: She is not in acute distress.    Appearance: She is well-developed. She is obese.  HENT:     Head: Normocephalic and atraumatic.     Right Ear: Tympanic membrane normal.     Left Ear: Tympanic membrane normal.  Eyes:     Pupils: Pupils are equal, round, and reactive to light.  Neck:     Thyroid: No thyromegaly.  Cardiovascular:     Rate and Rhythm: Normal rate and regular rhythm.     Heart sounds: Normal heart sounds. No murmur  heard.   Pulmonary:     Effort: Pulmonary effort is normal. No respiratory distress.     Breath sounds: Normal breath sounds. No wheezing.  Abdominal:     General: Bowel sounds are normal. There is no distension.     Palpations: Abdomen is soft.     Tenderness: There is no abdominal tenderness.  Musculoskeletal:        General: No tenderness.     Cervical back: Normal range of motion and neck supple.     Comments: Pain in left shoulder with internal rotation  Skin:    General: Skin is warm and dry.  Neurological:     Mental Status: She is alert and oriented to person, place, and time.     Cranial Nerves: No cranial nerve deficit.     Deep Tendon Reflexes: Reflexes are normal and symmetric.  Psychiatric:        Behavior: Behavior normal.        Thought Content: Thought content normal.        Judgment: Judgment normal.     leftshoulder prepped with betadine Injected with betamethaone and lidocaine with 25 guage needle x 1. Patient tolerated well.   BP (!) 131/57   Pulse 69   Temp 97.7 F (36.5 C) (Temporal)   Ht 5' 7"  (1.702 m)   Wt 207 lb 3.2 oz (94 kg)   SpO2 97%   BMI 32.45 kg/m      Assessment & Plan:  SHAVAUN OSTERLOH comes in today with chief complaint of Diabetes (Fasting no concerns ) and Anxiety   Diagnosis and orders addressed:  1. Chronic midline thoracic back pain - cyclobenzaprine (FLEXERIL) 10 MG tablet; Take 1 tablet (10 mg total) by mouth at bedtime.  Dispense: 90 tablet; Refill: 2 - CMP14+EGFR - CBC with Differential/Platelet  2. Essential hypertension - losartan (COZAAR) 50 MG tablet; TAKE 1 TABLET BY MOUTH ONCE DAILY  Dispense: 90 tablet; Refill: 4 - CMP14+EGFR - CBC with Differential/Platelet  3. Pure hypercholesterolemia - atorvastatin (LIPITOR) 20 MG tablet; TAKE 1 TABLET BY MOUTH ONCE DAILY  Dispense: 90 tablet; Refill: 0 - CMP14+EGFR - CBC with Differential/Platelet  4. Hyperlipidemia, unspecified hyperlipidemia type - atorvastatin  (LIPITOR) 20 MG tablet; TAKE 1 TABLET BY MOUTH ONCE DAILY  Dispense: 90 tablet; Refill: 0 - CMP14+EGFR - CBC with Differential/Platelet - Lipid panel  5. Hypothyroidism, unspecified type - levothyroxine (SYNTHROID) 50 MCG tablet; TAKE 1 TABLET BY MOUTH ONCE DAILY  Dispense: 90 tablet; Refill: 3 - CMP14+EGFR - CBC with Differential/Platelet  6. Type 2 diabetes mellitus without complication, without long-term current use of insulin (HCC) - metFORMIN (GLUCOPHAGE) 1000 MG tablet; TAKE 1 TABLET BY MOUTH TWO TIMES DAILY WITH A MEAL  Dispense: 180 tablet; Refill: 0 - CMP14+EGFR - CBC with Differential/Platelet - Bayer DCA Hb A1c Waived - Microalbumin / creatinine urine ratio  7. Vitamin D deficiency - Vitamin D, Ergocalciferol, (DRISDOL) 1.25 MG (50000 UNIT) CAPS capsule; TAKE 1 CAPSULE BY MOUTH EVERY 7 DAYS  Dispense: 12 capsule; Refill: 0 - CMP14+EGFR - CBC with Differential/Platelet  8. Atherosclerosis of aorta (HCC) - CMP14+EGFR - CBC with Differential/Platelet  9. OSA (obstructive sleep apnea) - CMP14+EGFR - CBC with Differential/Platelet  10. Obesity (BMI 30-39.9) - CMP14+EGFR - CBC with Differential/Platelet  11. Chronic idiopathic constipation - CMP14+EGFR - CBC with Differential/Platelet -  lubiprostone (AMITIZA) 24 MCG capsule; Take 1 capsule (24 mcg total) by mouth 2 (two) times daily with a meal.  Dispense: 90 capsule; Refill: 2  12. Acute pain of right shoulder - Ambulatory referral to Orthopedic Surgery  13. Cervical radiculitis - Ambulatory referral to Orthopedic Surgery   Labs pending Health Maintenance reviewed Diet and exercise encouraged  Follow up plan: 4 months    Evelina Dun, FNP

## 2020-12-21 NOTE — Patient Instructions (Signed)
Shoulder Pain Many things can cause shoulder pain, including:  An injury to the shoulder.  Overuse of the shoulder.  Arthritis. The source of the pain can be:  Inflammation.  An injury to the shoulder joint.  An injury to a tendon, ligament, or bone. Follow these instructions at home: Pay attention to changes in your symptoms. Let your health care provider know about them. Follow these instructions to relieve your pain. If you have a sling:  Wear the sling as told by your health care provider. Remove it only as told by your health care provider.  Loosen the sling if your fingers tingle, become numb, or turn cold and blue.  Keep the sling clean.  If the sling is not waterproof: ? Do not let it get wet. Remove it to shower or bathe.  Move your arm as little as possible, but keep your hand moving to prevent swelling. Managing pain, stiffness, and swelling  If directed, put ice on the painful area: ? Put ice in a plastic bag. ? Place a towel between your skin and the bag. ? Leave the ice on for 20 minutes, 2-3 times per day. Stop applying ice if it does not help with the pain.  Squeeze a soft ball or a foam pad as much as possible. This helps to keep the shoulder from swelling. It also helps to strengthen the arm.   General instructions  Take over-the-counter and prescription medicines only as told by your health care provider.  Keep all follow-up visits as told by your health care provider. This is important. Contact a health care provider if:  Your pain gets worse.  Your pain is not relieved with medicines.  New pain develops in your arm, hand, or fingers. Get help right away if:  Your arm, hand, or fingers: ? Tingle. ? Become numb. ? Become swollen. ? Become painful. ? Turn white or blue. Summary  Shoulder pain can be caused by an injury, overuse, or arthritis.  Pay attention to changes in your symptoms. Let your health care provider know about  them.  This condition may be treated with a sling, ice, and pain medicines.  Contact your health care provider if the pain gets worse or new pain develops. Get help right away if your arm, hand, or fingers tingle or become numb, swollen, or painful.  Keep all follow-up visits as told by your health care provider. This is important. This information is not intended to replace advice given to you by your health care provider. Make sure you discuss any questions you have with your health care provider. Document Revised: 03/02/2018 Document Reviewed: 03/02/2018 Elsevier Patient Education  2021 Elsevier Inc.  

## 2020-12-22 LAB — CBC WITH DIFFERENTIAL/PLATELET
Basophils Absolute: 0.1 10*3/uL (ref 0.0–0.2)
Basos: 0 %
EOS (ABSOLUTE): 0.3 10*3/uL (ref 0.0–0.4)
Eos: 2 %
Hematocrit: 40.5 % (ref 34.0–46.6)
Hemoglobin: 13.2 g/dL (ref 11.1–15.9)
Immature Grans (Abs): 0.1 10*3/uL (ref 0.0–0.1)
Immature Granulocytes: 1 %
Lymphocytes Absolute: 3 10*3/uL (ref 0.7–3.1)
Lymphs: 21 %
MCH: 26.6 pg (ref 26.6–33.0)
MCHC: 32.6 g/dL (ref 31.5–35.7)
MCV: 82 fL (ref 79–97)
Monocytes Absolute: 0.5 10*3/uL (ref 0.1–0.9)
Monocytes: 4 %
Neutrophils Absolute: 10.5 10*3/uL — ABNORMAL HIGH (ref 1.4–7.0)
Neutrophils: 72 %
Platelets: 363 10*3/uL (ref 150–450)
RBC: 4.96 x10E6/uL (ref 3.77–5.28)
RDW: 16.1 % — ABNORMAL HIGH (ref 11.7–15.4)
WBC: 14.5 10*3/uL — ABNORMAL HIGH (ref 3.4–10.8)

## 2020-12-22 LAB — LIPID PANEL
Chol/HDL Ratio: 3.2 ratio (ref 0.0–4.4)
Cholesterol, Total: 159 mg/dL (ref 100–199)
HDL: 50 mg/dL (ref >39)
LDL Chol Calc (NIH): 86 mg/dL (ref 0–99)
Triglycerides: 128 mg/dL (ref 0–149)
VLDL Cholesterol Cal: 23 mg/dL (ref 5–40)

## 2020-12-22 LAB — MICROALBUMIN / CREATININE URINE RATIO
Creatinine, Urine: 6.2 mg/dL
Microalbumin, Urine: 4.5 ug/mL

## 2020-12-22 LAB — CMP14+EGFR
ALT: 10 IU/L (ref 0–32)
AST: 13 IU/L (ref 0–40)
Albumin/Globulin Ratio: 1.7 (ref 1.2–2.2)
Albumin: 4.6 g/dL (ref 3.8–4.8)
Alkaline Phosphatase: 160 IU/L — ABNORMAL HIGH (ref 44–121)
BUN/Creatinine Ratio: 12 (ref 12–28)
BUN: 8 mg/dL (ref 8–27)
Bilirubin Total: 0.2 mg/dL (ref 0.0–1.2)
CO2: 22 mmol/L (ref 20–29)
Calcium: 10.4 mg/dL — ABNORMAL HIGH (ref 8.7–10.3)
Chloride: 98 mmol/L (ref 96–106)
Creatinine, Ser: 0.69 mg/dL (ref 0.57–1.00)
Globulin, Total: 2.7 g/dL (ref 1.5–4.5)
Glucose: 115 mg/dL — ABNORMAL HIGH (ref 65–99)
Potassium: 4.5 mmol/L (ref 3.5–5.2)
Sodium: 137 mmol/L (ref 134–144)
Total Protein: 7.3 g/dL (ref 6.0–8.5)
eGFR: 99 mL/min/{1.73_m2} (ref >59)

## 2020-12-22 LAB — TSH: TSH: 2.53 u[IU]/mL (ref 0.450–4.500)

## 2020-12-24 ENCOUNTER — Other Ambulatory Visit: Payer: Self-pay

## 2020-12-24 DIAGNOSIS — R748 Abnormal levels of other serum enzymes: Secondary | ICD-10-CM

## 2020-12-24 NOTE — Telephone Encounter (Signed)
Response to mychart labs add on sheet taken to lab

## 2020-12-25 LAB — SPECIMEN STATUS REPORT

## 2020-12-25 LAB — GAMMA GT: GGT: 17 IU/L (ref 0–60)

## 2020-12-26 ENCOUNTER — Other Ambulatory Visit (HOSPITAL_COMMUNITY): Payer: Self-pay

## 2020-12-27 ENCOUNTER — Telehealth: Payer: Self-pay | Admitting: Family Medicine

## 2020-12-28 ENCOUNTER — Other Ambulatory Visit (HOSPITAL_COMMUNITY): Payer: Self-pay

## 2020-12-28 NOTE — Telephone Encounter (Signed)
Appt made

## 2020-12-31 ENCOUNTER — Encounter: Payer: Self-pay | Admitting: Family Medicine

## 2020-12-31 NOTE — Telephone Encounter (Signed)
Hey I see in the message yall agreed on the 6th could you check into this please ?

## 2021-01-01 ENCOUNTER — Other Ambulatory Visit (HOSPITAL_COMMUNITY): Payer: Self-pay

## 2021-01-04 ENCOUNTER — Other Ambulatory Visit: Payer: Self-pay

## 2021-01-04 ENCOUNTER — Other Ambulatory Visit (INDEPENDENT_AMBULATORY_CARE_PROVIDER_SITE_OTHER): Payer: No Typology Code available for payment source

## 2021-01-04 DIAGNOSIS — Z780 Asymptomatic menopausal state: Secondary | ICD-10-CM

## 2021-01-09 ENCOUNTER — Encounter: Payer: Self-pay | Admitting: Family Medicine

## 2021-01-11 ENCOUNTER — Other Ambulatory Visit: Payer: No Typology Code available for payment source

## 2021-01-19 ENCOUNTER — Other Ambulatory Visit (HOSPITAL_COMMUNITY): Payer: Self-pay

## 2021-01-21 ENCOUNTER — Other Ambulatory Visit (HOSPITAL_COMMUNITY): Payer: Self-pay

## 2021-02-05 ENCOUNTER — Other Ambulatory Visit (HOSPITAL_COMMUNITY): Payer: Self-pay | Admitting: Orthopedic Surgery

## 2021-02-05 DIAGNOSIS — M25512 Pain in left shoulder: Secondary | ICD-10-CM

## 2021-02-19 ENCOUNTER — Other Ambulatory Visit (HOSPITAL_COMMUNITY): Payer: Self-pay

## 2021-02-22 ENCOUNTER — Other Ambulatory Visit: Payer: Self-pay

## 2021-02-22 ENCOUNTER — Ambulatory Visit (HOSPITAL_COMMUNITY)
Admission: RE | Admit: 2021-02-22 | Discharge: 2021-02-22 | Disposition: A | Discharge status: Home / Self Care | Payer: No Typology Code available for payment source | Source: Ambulatory Visit | Attending: Orthopedic Surgery | Admitting: Orthopedic Surgery

## 2021-02-22 DIAGNOSIS — M25512 Pain in left shoulder: Secondary | ICD-10-CM | POA: Diagnosis not present

## 2021-03-01 ENCOUNTER — Other Ambulatory Visit (HOSPITAL_COMMUNITY): Payer: Self-pay

## 2021-03-01 MED ORDER — PREDNISONE 5 MG (21) PO TBPK
ORAL_TABLET | ORAL | 0 refills | Status: DC
  Filled 2021-03-01: qty 21, 6d supply, fill #0

## 2021-03-02 ENCOUNTER — Other Ambulatory Visit (HOSPITAL_COMMUNITY): Payer: Self-pay

## 2021-03-11 ENCOUNTER — Other Ambulatory Visit (HOSPITAL_COMMUNITY): Payer: Self-pay

## 2021-03-16 ENCOUNTER — Other Ambulatory Visit (HOSPITAL_COMMUNITY): Payer: Self-pay

## 2021-03-18 ENCOUNTER — Other Ambulatory Visit (HOSPITAL_COMMUNITY): Payer: Self-pay

## 2021-03-18 ENCOUNTER — Encounter: Payer: Self-pay | Admitting: Family

## 2021-03-18 ENCOUNTER — Ambulatory Visit (INDEPENDENT_AMBULATORY_CARE_PROVIDER_SITE_OTHER): Payer: No Typology Code available for payment source | Admitting: Family

## 2021-03-18 DIAGNOSIS — R399 Unspecified symptoms and signs involving the genitourinary system: Secondary | ICD-10-CM | POA: Diagnosis not present

## 2021-03-18 MED ORDER — CEPHALEXIN 500 MG PO CAPS
500.0000 mg | ORAL_CAPSULE | Freq: Two times a day (BID) | ORAL | 0 refills | Status: DC
  Filled 2021-03-18: qty 14, 7d supply, fill #0

## 2021-03-18 NOTE — Progress Notes (Signed)
Virtual Visit  Note Due to COVID-19 pandemic this visit was conducted virtually. This visit type was conducted due to national recommendations for restrictions regarding the COVID-19 Pandemic (e.g. social distancing, sheltering in place) in an effort to limit this patient's exposure and mitigate transmission in our community. All issues noted in this document were discussed and addressed.  A physical exam was not performed with this format.  I connected with Brenda Meyer on 03/18/21 at 1:10 pm by telephone and verified that I am speaking with the correct person using two identifiers. Brenda Meyer is currently located at working and no one is currently with her during visit. The provider, Jannifer Rodney, FNP is located in their office at time of visit.  I discussed the limitations, risks, security and privacy concerns of performing an evaluation and management service by telephone and the availability of in person appointments. I also discussed with the patient that there may be a patient responsible charge related to this service. The patient expressed understanding and agreed to proceed.  Brenda Meyer, Brenda Meyer are scheduled for a virtual visit with your provider today.    Just as we do with appointments in the office, we must obtain your consent to participate.  Your consent will be active for this visit and any virtual visit you may have with one of our providers in the next 365 days.    If you have a MyChart account, I can also send a copy of this consent to you electronically.  All virtual visits are billed to your insurance company just like a traditional visit in the office.  As this is a virtual visit, video technology does not allow for your provider to perform a traditional examination.  This may limit your provider's ability to fully assess your condition.  If your provider identifies any concerns that need to be evaluated in person or the need to arrange testing such as labs, EKG, etc, we will  make arrangements to do so.    Although advances in technology are sophisticated, we cannot ensure that it will always work on either your end or our end.  If the connection with a video visit is poor, we may have to switch to a telephone visit.  With either a video or telephone visit, we are not always able to ensure that we have a secure connection.   I need to obtain your verbal consent now.   Are you willing to proceed with your visit today?   Brenda Meyer has provided verbal consent on 03/18/2021 for a virtual visit (video or telephone).   Jannifer Rodney, [place] 03/18/2021  1:07 PM    History and Present Illness:  Dysuria  This is a new problem. The current episode started yesterday. The problem occurs every urination. The problem has been waxing and waning. The quality of the pain is described as burning. The pain is at a severity of 8/10. The pain is mild. Associated symptoms include frequency, hesitancy and urgency. Pertinent negatives include no discharge, flank pain, hematuria, nausea or vomiting. She has tried increased fluids for the symptoms. The treatment provided mild relief.     Review of Systems  Gastrointestinal:  Negative for nausea and vomiting.  Genitourinary:  Positive for dysuria, frequency, hesitancy and urgency. Negative for flank pain and hematuria.    Observations/Objective: No SOB or distress noted   Assessment and Plan: 1. UTI symptoms Force fluids AZO over the counter X2 days RTO if symptoms worsen or do not  improve - cephALEXin (KEFLEX) 500 MG capsule; Take 1 capsule (500 mg total) by mouth 2 (two) times daily.  Dispense: 14 capsule; Refill: 0    I discussed the assessment and treatment plan with the patient. The patient was provided an opportunity to ask questions and all were answered. The patient agreed with the plan and demonstrated an understanding of the instructions.   The patient was advised to call back or seek an in-person evaluation if the  symptoms worsen or if the condition fails to improve as anticipated.  The above assessment and management plan was discussed with the patient. The patient verbalized understanding of and has agreed to the management plan. Patient is aware to call the clinic if symptoms persist or worsen. Patient is aware when to return to the clinic for a follow-up visit. Patient educated on when it is appropriate to go to the emergency department.   Time call ended:  1:21 pm  I provided 11 minutes of  non face-to-face time during this encounter.    Jannifer Rodney, FNP

## 2021-03-19 ENCOUNTER — Other Ambulatory Visit (HOSPITAL_COMMUNITY): Payer: Self-pay

## 2021-03-20 ENCOUNTER — Other Ambulatory Visit: Payer: Self-pay

## 2021-03-20 ENCOUNTER — Ambulatory Visit: Payer: No Typology Code available for payment source | Attending: Orthopedic Surgery

## 2021-03-20 DIAGNOSIS — R293 Abnormal posture: Secondary | ICD-10-CM | POA: Diagnosis present

## 2021-03-20 DIAGNOSIS — M5412 Radiculopathy, cervical region: Secondary | ICD-10-CM

## 2021-03-20 DIAGNOSIS — M542 Cervicalgia: Secondary | ICD-10-CM | POA: Diagnosis present

## 2021-03-20 DIAGNOSIS — M62838 Other muscle spasm: Secondary | ICD-10-CM

## 2021-03-20 DIAGNOSIS — M6281 Muscle weakness (generalized): Secondary | ICD-10-CM

## 2021-03-20 NOTE — Therapy (Signed)
[facility] [address] [place], [place], [address] Phone: [phone]   Fax:  [phone]  Physical Therapy Evaluation  Patient Details  Name: Brenda Meyer MRN: [id number] Date of Birth: 07-Jun-1961 Referring Provider (PT): Francena Hanly, MD   Encounter Date: 03/20/2021    Past Medical History:  Diagnosis Date   Diabetes mellitus (HCC)    Hyperlipidemia    Hypertension    Hypothyroidism    OSA (obstructive sleep apnea)     Past Surgical History:  Procedure Laterality Date   ANAL RECTAL MANOMETRY N/A 04/27/2019   Procedure: ANO RECTAL MANOMETRY/balloon expulsion;  Surgeon: Willis Modena, MD;  Location: [facility];  Service: Endoscopy;  Laterality: N/A;   COLONOSCOPY  11/17/2011   Procedure: COLONOSCOPY;  Surgeon: Charna Elizabeth, MD;  Location: [facility];  Service: Endoscopy;  Laterality: N/A;   SHOULDER SURGERY     right    There were no vitals filed for this visit.    Subjective Assessment - 03/20/21 1040     Subjective Patient states that she has been having shoulder pain on the L side when she is sleeping and driving, she has to shake her hand to reduce the tension in her fingers (4-5th digits). Pateint states that she has been feeling this for 6 months and had relief for one month after having an injection in the L shoulder.    How long can you sit comfortably? no limitation    Patient Stated Goals derease numbness and tingling and reduce the pain getting worse;    Currently in Pain? 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      Observation/Other Assessments   Focus on Therapeutic Outcomes (FOTO)  TBD      Posture/Postural Control   Posture/Postural Control Postural limitations    Postural Limitations Forward head;Rounded Shoulders      Deep Tendon Reflexes   DTR Assessment Site Biceps;Brachioradialis;Triceps    Biceps DTR 2+    Brachioradialis DTR 2+    Triceps DTR 2+      ROM / Strength   AROM / PROM / Strength AROM;Strength      AROM   Overall AROM  Within functional limits for tasks performed    Overall AROM Comments limitation at the L UE ER when supinated at the wrist  ( scapula elevates)      Strength   Overall Strength Within functional limits for tasks performed      Palpation   Palpation comment TTP at the LUE - UT, LS, rhomboids      Special Tests    Special Tests Cervical    Cervical Tests Spurling's;Dictraction      Spurling's   Findings Negative      Distraction Test   Findngs Negative  Objective measurements completed on examination: See above findings.       [facility] Adult PT Treatment/Exercise - 03/20/21 0001       Modalities   Modalities Electrical Stimulation;Ultrasound      Programme researcher, broadcasting/film/video Location L UT, LS    Electrical Stimulation Action combination    Electrical Stimulation Parameters 890pps; 115 v    Electrical Stimulation Goals Tone;Edema      Ultrasound   Ultrasound Location L UT, LS    Ultrasound Parameters 1.5cmw2    Ultrasound Goals Pain      Manual Therapy   Manual Therapy Soft tissue mobilization    Soft tissue mobilization STM To the UT and LS of the L UE                   PT Short Term Goals - 03/20/21 1437       PT SHORT TERM GOAL #1   Title Indep with initial HEP    Baseline lacks awareness of initial HEP    Time 1    Period Weeks    Status New    Target Date 03/27/21               PT  Long Term Goals - 03/20/21 1438       PT LONG TERM GOAL #1   Title Patient will be indep with advanced HEP    Time 4    Period Weeks    Status New    Target Date 04/17/21      PT LONG TERM GOAL #2   Title Patient will be able to report decreased reports of peripheral nerve symptoms    Baseline chronic with sleeping and driving    Time 4    Period Weeks    Status New    Target Date 04/17/21      PT LONG TERM GOAL #3   Title Patient will be able to demonstrate improved shoulder ER and reduced scapular elevation with ROM    Time 4    Period Weeks    Status New    Target Date 04/17/21                    Plan - 03/20/21 1127     Clinical Impression Statement [name] is a pleasant 60 yo female with history of chronic L shoulder pain. She reports to O[facility] with referral for cervical adiculopathy. Her signs and symptoms appear consistent with mechanical deficits and postural limitations. She has limitations at the latisimus dorsi, UE supinators and external rotators. She has palpable trigger points at the UT and LS region with presence of normal erythema at the site or treatment at end of session. Skilled PT recommended to promote external rotation mobility and postural endurance to reduce capsular restrictions at the L shoulder.    Personal Factors and Comorbidities Age;Comorbidity 1;Past/Current Experience;Time since onset of injury/illness/exacerbation;Profession    Comorbidities obesity, CHF, HTN    Examination-Activity Limitations Dressing;Reach Overhead;Carry;Caring for Others;Lift;Sleep    Examination-Participation Restrictions Driving    Stability/Clinical Decision Making Evolving/Moderate complexity    Clinical Decision Making Low    Rehab Potential Excellent    PT Frequency 2x / week    PT Duration 4 weeks    PT Treatment/Interventions Electrical Stimulation;Moist Heat;Ultrasound;Stair training;Therapeutic activities;Therapeutic exercise;Patient/family  education;Manual techniques;Neuromuscular re-education;Dry needling;Passive range of motion;ADLs/Self Care Home Management    PT Next Visit Plan continue with modalities as needed - isometric strength for shoulder  ER. seated SNAGs    PT Home Exercise Plan see pt instructions    Consulted and Agree with Plan of Care Patient             Patient will benefit from skilled therapeutic intervention in order to improve the following deficits and impairments:  Decreased endurance, Decreased mobility, Hypomobility, Increased muscle spasms, Impaired tone, Decreased range of motion, Decreased activity tolerance, Decreased strength, Increased fascial restricitons, Impaired UE functional use, Pain, Postural dysfunction  Visit Diagnosis: Radiculopathy, cervical region  Cervicalgia  Abnormal posture  Other muscle spasm  Muscle weakness (generalized)     Problem List Patient Active Problem List   Diagnosis Date Noted   Constipation 11/01/2018   Hx of smoking 11/12/2016   Atherosclerosis of aorta (HCC) 11/12/2016   Anxiety and depression 08/28/2016   Obesity (BMI 30-39.9) 07/29/2016   Essential hypertension 06/12/2015   Vitamin D deficiency 12/11/2014   Back pain 12/08/2014   Hypothyroidism 12/08/2014   Hyperlipidemia 12/08/2014   Diabetes mellitus, type 2 (HCC) 12/08/2014   OSA (obstructive sleep apnea) 01/21/2011    Levonne Spiller PT, DPT 03/20/2021, 2:54 PM  [facility] [address] [place], [place], [address] Phone: [phone]   Fax:  [phone]  Name: JAYRA CHOYCE MRN: [id number] Date of Birth: April 20, 1961

## 2021-03-21 ENCOUNTER — Other Ambulatory Visit (HOSPITAL_COMMUNITY): Payer: Self-pay

## 2021-03-22 ENCOUNTER — Other Ambulatory Visit: Payer: Self-pay

## 2021-03-22 ENCOUNTER — Ambulatory Visit: Payer: No Typology Code available for payment source

## 2021-03-22 DIAGNOSIS — R293 Abnormal posture: Secondary | ICD-10-CM

## 2021-03-22 DIAGNOSIS — M6281 Muscle weakness (generalized): Secondary | ICD-10-CM

## 2021-03-22 DIAGNOSIS — M62838 Other muscle spasm: Secondary | ICD-10-CM

## 2021-03-22 DIAGNOSIS — M5412 Radiculopathy, cervical region: Secondary | ICD-10-CM | POA: Diagnosis not present

## 2021-03-22 DIAGNOSIS — M542 Cervicalgia: Secondary | ICD-10-CM

## 2021-03-22 NOTE — Therapy (Signed)
[facility] [address] [place], [place], [address] Phone: [phone]   Fax:  [phone]  Physical Therapy Treatment  Patient Details  Name: Brenda Meyer MRN: [id number] Date of Birth: [birth year] Referring Provider (PT): Francena Hanly, MD   Encounter Date: 03/22/2021   PT End of Session - 03/22/21 1136     Visit Number 2    Number of Visits 8    Date for PT Re-Evaluation 04/17/21    PT Start Time 0945    PT Stop Time 1032    PT Time Calculation (min) 47 min    Activity Tolerance Patient tolerated treatment well    Behavior During Therapy [facility] for tasks assessed/performed             Past Medical History:  Diagnosis Date   Diabetes mellitus (HCC)    Hyperlipidemia    Hypertension    Hypothyroidism    OSA (obstructive sleep apnea)     Past Surgical History:  Procedure Laterality Date   ANAL RECTAL MANOMETRY N/A 04/27/2019   Procedure: ANO RECTAL MANOMETRY/balloon expulsion;  Surgeon: Willis Modena, MD;  Location: [facility];  Service: Endoscopy;  Laterality: N/A;   COLONOSCOPY  11/17/2011   Procedure: COLONOSCOPY;  Surgeon: Charna Elizabeth, MD;  Location: [facility];  Service: Endoscopy;  Laterality: N/A;   SHOULDER SURGERY     right    There were no vitals filed for this visit.   Subjective Assessment - 03/22/21 1012     Subjective COVID-19 screening performed upon arrival. Patient reports that she has been doing well overall . She brought in her TENs unit and requests to cover how to use it. Patient denies pain in the L shoulder today. She reports that the pain only woke her up one time.    Limitations Lifting    How long can you sit comfortably? no limitation    How long can you stand comfortably? no limitation    How long can you walk comfortably? no limitation    Patient Stated Goals derease numbness and tingling and reduce the pain getting worse;    Currently in Pain? No/denies                                [facility] Adult PT Treatment/Exercise - 03/22/21 0001       Posture/Postural Control   Posture/Postural Control Postural limitations    Postural Limitations Forward head;Rounded Shoulders      Therapeutic Activites    Therapeutic Activities Other Therapeutic Activities    Other Therapeutic Activities education on HEP and review use of home TENS unit      Exercises   Exercises Shoulder;Other Exercises    Other Exercises  physioball roll up the wall      Shoulder Exercises: Seated   Other Seated Exercises Review HEP : LD stretch      Modalities   Modalities Electrical Stimulation;Ultrasound      Electrical Stimulation   Electrical Stimulation Location L UT, LS    Electrical Stimulation Goals Tone;Edema      Manual Therapy   Manual Therapy Soft tissue mobilization;Joint mobilization    Joint Mobilization CPA at the C7 and upper thoracic region.    Soft tissue mobilization STM to the L Latissimus dorsi, STM to the UT and LS                      PT  Short Term Goals - 03/20/21 1437       PT SHORT TERM GOAL #1   Title Indep with initial HEP    Baseline lacks awareness of initial HEP    Time 1    Period Weeks    Status New    Target Date 03/27/21               PT Long Term Goals - 03/20/21 1438       PT LONG TERM GOAL #1   Title Patient will be indep with advanced HEP    Time 4    Period Weeks    Status New    Target Date 04/17/21      PT LONG TERM GOAL #2   Title Patient will be able to report decreased reports of peripheral nerve symptoms    Baseline chronic with sleeping and driving    Time 4    Period Weeks    Status New    Target Date 04/17/21      PT LONG TERM GOAL #3   Title Patient will be able to demonstrate improved shoulder ER and reduced scapular elevation with ROM    Time 4    Period Weeks    Status New    Target Date 04/17/21                   Plan - 03/22/21 1138     Clinical  Impression Statement Patient arrives to PT clinic with minimal irritability of symptoms. She demonstrates excellent recall of intiial HEP and was encouraged to continue wisymptoms addition of seated extension at work and seated SNAGs (image provided for the latter). Patent without any recreation of symptoms this visit. Plan to continue per plan of care with goals to encourage improved cervicothoracic mobility, improve seated posture, adnd promote ease of driving and sleeping.    Personal Factors and Comorbidities Age;Comorbidity 1;Past/Current Experience;Time since onset of injury/illness/exacerbation;Profession;Comorbidity 2;Comorbidity 3+    Comorbidities obesity, HTN, OSA, hypothyroidism, hyperlipidema, hx of R TSA    Examination-Activity Limitations Dressing;Reach Overhead;Carry;Caring for Others;Lift;Sleep    Examination-Participation Restrictions Driving    Stability/Clinical Decision Making Evolving/Moderate complexity    Clinical Decision Making Low    Rehab Potential Excellent    PT Frequency 2x / week    PT Duration 4 weeks    PT Treatment/Interventions Electrical Stimulation;Moist Heat;Ultrasound;Stair training;Therapeutic activities;Therapeutic exercise;Patient/family education;Manual techniques;Neuromuscular re-education;Dry needling;Passive range of motion;ADLs/Self Care Home Management    PT Next Visit Plan continue with modalities as needed - isometric strength for shoulder ER. seated SNAGs    PT Home Exercise Plan see pt instructions    Consulted and Agree with Plan of Care Patient             Patient will benefit from skilled therapeutic intervention in order to improve the following deficits and impairments:  Decreased endurance, Decreased mobility, Hypomobility, Increased muscle spasms, Impaired tone, Decreased range of motion, Decreased activity tolerance, Decreased strength, Increased fascial restricitons, Impaired UE functional use, Pain, Postural dysfunction  Visit  Diagnosis: Radiculopathy, cervical region  Cervicalgia  Abnormal posture  Other muscle spasm  Muscle weakness (generalized)     Problem List Patient Active Problem List   Diagnosis Date Noted   Constipation 11/01/2018   Hx of smoking 11/12/2016   Atherosclerosis of aorta (HCC) 11/12/2016   Anxiety and depression 08/28/2016   Obesity (BMI 30-39.9) 07/29/2016   Essential hypertension 06/12/2015   Vitamin D deficiency 12/11/2014   Back pain 12/08/2014  Hypothyroidism 12/08/2014   Hyperlipidemia 12/08/2014   Diabetes mellitus, type 2 (HCC) 12/08/2014   OSA (obstructive sleep apnea) 01/21/2011    Levonne Spiller PT, DPT 03/22/2021, 11:55 AM  [facility] [address] [place], [place], [address] Phone: [phone]   Fax:  [phone]  Name: ZAILYNN BRANDEL MRN: [id number] Date of Birth: 08/01/1961

## 2021-03-27 ENCOUNTER — Ambulatory Visit: Payer: No Typology Code available for payment source

## 2021-03-27 ENCOUNTER — Other Ambulatory Visit: Payer: Self-pay

## 2021-03-27 DIAGNOSIS — M5412 Radiculopathy, cervical region: Secondary | ICD-10-CM | POA: Diagnosis not present

## 2021-03-27 DIAGNOSIS — M6281 Muscle weakness (generalized): Secondary | ICD-10-CM

## 2021-03-27 DIAGNOSIS — M542 Cervicalgia: Secondary | ICD-10-CM

## 2021-03-27 DIAGNOSIS — R293 Abnormal posture: Secondary | ICD-10-CM

## 2021-03-27 NOTE — Therapy (Signed)
[facility] [address] [place], [place], [address] Phone: [phone]   Fax:  [phone]  Physical Therapy Treatment  Patient Details  Name: Brenda Meyer MRN: [id number] Date of Birth: [birth year] Referring Provider (PT): Francena Hanly, MD   Encounter Date: 03/27/2021   PT End of Session - 03/27/21 1513     Visit Number 3    Number of Visits 8    Date for PT Re-Evaluation 04/17/21    PT Start Time 0945    PT Stop Time 1030    PT Time Calculation (min) 45 min    Activity Tolerance Patient tolerated treatment well    Behavior During Therapy [facility] for tasks assessed/performed             Past Medical History:  Diagnosis Date   Diabetes mellitus (HCC)    Hyperlipidemia    Hypertension    Hypothyroidism    OSA (obstructive sleep apnea)     Past Surgical History:  Procedure Laterality Date   ANAL RECTAL MANOMETRY N/A 04/27/2019   Procedure: ANO RECTAL MANOMETRY/balloon expulsion;  Surgeon: Willis Modena, MD;  Location: [facility];  Service: Endoscopy;  Laterality: N/A;   COLONOSCOPY  11/17/2011   Procedure: COLONOSCOPY;  Surgeon: Charna Elizabeth, MD;  Location: [facility];  Service: Endoscopy;  Laterality: N/A;   SHOULDER SURGERY     right    There were no vitals filed for this visit.   Subjective Assessment - 03/27/21 1511     Subjective Patient arrived to PT clinic with reports of feeling overall a bit better where her fingers are still numb at times but she states that the symptoms are much more less than normal. Minimal tingling in the fingers when she notices anything,    Currently in Pain? No/denies    Pain Score 0-No pain                               [facility] Adult PT Treatment/Exercise - 03/27/21 0001       Neuro Re-ed    Neuro Re-ed Details  Nerve glides (ulnar nerve variation of mobility 1: UE abduction to elbow extension; palms together ulnar/median nerve glide      Exercises    Exercises Shoulder    Other Exercises  U/arche  - flexbar bending (VCs to maintain shoulder depressed/and elbows at 90deg )      Shoulder Exercises: Seated   Retraction Both;20 reps    Retraction Limitations VCs / tactile cues needed for movement-  performed best with back to wall    Row AROM;10 reps    External Rotation Left;10 reps    External Rotation Limitations with dowel (AAROM) with supination      Shoulder Exercises: Prone   External Rotation Left;10 reps    External Rotation Limitations sore at lateral deltoid and bicep    Internal Rotation Left;15 reps      Shoulder Exercises: ROM/Strengthening   UBE (Upper Arm Bike) 8 mins - 4 mins fwd/retro ez      Shoulder Exercises: Stretch   Internal Rotation Stretch 30 seconds    Wall Stretch - Flexion 3 reps;30 seconds    Other Shoulder Stretches tricep stretch standing ( 3  20 sec)      Manual Therapy   Manual Therapy Joint mobilization;Neural Stretch;Passive ROM;Muscle Energy Technique    Manual therapy comments Patient with normal responeto manual therapy with progression of tolerance to IR/ER  at the shoulder and limited but improving l UE supination    Joint Mobilization CPA thoracic spine in prone gr III-IV    Soft tissue mobilization STM to forearm ,biceps, and pectoral region    Passive ROM ER, IR, abduction    Muscle Energy Technique pin and stretch L tripcep    Neural Stretch ullnar nerve glide                      PT Short Term Goals - 03/20/21 1437       PT SHORT TERM GOAL #1   Title Indep with initial HEP    Baseline lacks awareness of initial HEP    Time 1    Period Weeks    Status New    Target Date 03/27/21               PT Long Term Goals - 03/20/21 1438       PT LONG TERM GOAL #1   Title Patient will be indep with advanced HEP    Time 4    Period Weeks    Status New    Target Date 04/17/21      PT LONG TERM GOAL #2   Title Patient will be able to report decreased reports of  peripheral nerve symptoms    Baseline chronic with sleeping and driving    Time 4    Period Weeks    Status New    Target Date 04/17/21      PT LONG TERM GOAL #3   Title Patient will be able to demonstrate improved shoulder ER and reduced scapular elevation with ROM    Time 4    Period Weeks    Status New    Target Date 04/17/21                   Plan - 03/27/21 1514     Clinical Impression Statement Patient presents with minimal irritability of symptoms. Able to reduce tenosion and radicular symptoms at the elbow and wrist with maunal therapy and nerve glides. Patient requires ample tactile and verbal cues to manage performing scapular retraction without shoulder elevation compensation. Skilled PT recommended to continue per plan of care.    Examination-Activity Limitations Reach Overhead;Lift;Carry    Examination-Participation Restrictions Driving;Other    Stability/Clinical Decision Making Stable/Uncomplicated    Clinical Decision Making Low    Rehab Potential Excellent    PT Frequency 2x / week    PT Duration 4 weeks    PT Next Visit Plan progress with scapular retraction - serratus punches - thoracic mobiltiy (open book/ thread the needle             Patient will benefit from skilled therapeutic intervention in order to improve the following deficits and impairments:  Decreased range of motion, Impaired tone, Decreased strength, Impaired flexibility, Pain, Impaired sensation, Increased muscle spasms  Visit Diagnosis: Radiculopathy, cervical region  Cervicalgia  Abnormal posture  Muscle weakness (generalized)     Problem List Patient Active Problem List   Diagnosis Date Noted   Constipation 11/01/2018   Hx of smoking 11/12/2016   Atherosclerosis of aorta (HCC) 11/12/2016   Anxiety and depression 08/28/2016   Obesity (BMI 30-39.9) 07/29/2016   Essential hypertension 06/12/2015   Vitamin D deficiency 12/11/2014   Back pain 12/08/2014    Hypothyroidism 12/08/2014   Hyperlipidemia 12/08/2014   Diabetes mellitus, type 2 (HCC) 12/08/2014   OSA (obstructive sleep apnea) 01/21/2011  Levonne Spiller PT, DPT 03/27/2021, 3:18 PM  [facility] [address] [place], [place], [address] Phone: [phone]   Fax:  [phone]  Name: Brenda Meyer MRN: [id number] Date of Birth: December 12, 1960

## 2021-04-02 ENCOUNTER — Telehealth: Payer: Self-pay | Admitting: Physical Therapy

## 2021-04-02 NOTE — Telephone Encounter (Signed)
SHE WANTS TO SEE HER MD BEFORE SHE COMES BACK FOR PT-PATIENT WILL CALL us BACK TO [facility] WED 8/3 AND FRIDAY 8/5

## 2021-04-03 ENCOUNTER — Ambulatory Visit: Payer: No Typology Code available for payment source | Admitting: Physical Therapy

## 2021-04-05 ENCOUNTER — Encounter: Payer: No Typology Code available for payment source | Admitting: Physical Therapy

## 2021-04-09 DIAGNOSIS — N393 Stress incontinence (female) (male): Secondary | ICD-10-CM | POA: Insufficient documentation

## 2021-04-09 DIAGNOSIS — Z90710 Acquired absence of both cervix and uterus: Secondary | ICD-10-CM | POA: Insufficient documentation

## 2021-04-09 DIAGNOSIS — S83209A Unspecified tear of unspecified meniscus, current injury, unspecified knee, initial encounter: Secondary | ICD-10-CM | POA: Insufficient documentation

## 2021-04-18 ENCOUNTER — Other Ambulatory Visit (HOSPITAL_COMMUNITY): Payer: Self-pay

## 2021-04-19 ENCOUNTER — Other Ambulatory Visit (HOSPITAL_COMMUNITY): Payer: Self-pay

## 2021-05-08 ENCOUNTER — Other Ambulatory Visit: Payer: Self-pay

## 2021-05-08 ENCOUNTER — Ambulatory Visit (INDEPENDENT_AMBULATORY_CARE_PROVIDER_SITE_OTHER): Payer: No Typology Code available for payment source | Admitting: Pulmonary Disease

## 2021-05-08 ENCOUNTER — Encounter: Payer: Self-pay | Admitting: Pulmonary Disease

## 2021-05-08 VITALS — BP 138/72 | HR 68 | Temp 98.0°F | Ht 67.0 in | Wt 211.0 lb

## 2021-05-08 DIAGNOSIS — Z9989 Dependence on other enabling machines and devices: Secondary | ICD-10-CM | POA: Diagnosis not present

## 2021-05-08 DIAGNOSIS — G4733 Obstructive sleep apnea (adult) (pediatric): Secondary | ICD-10-CM

## 2021-05-08 NOTE — Progress Notes (Signed)
[facility]  Chief Complaint  Patient presents with   Follow-up    No complaints     Constitutional:  BP 138/72 (BP Location: Right Arm)   Pulse 68   Temp 98 F (36.7 C)   Ht 5\' 7"  (1.702 m)   Wt 211 lb (95.7 kg)   SpO2 96%   BMI 33.05 kg/m   Past Medical History:  Hypothyroidism, HTN, HLD, DM  Past Surgical History:  Her  has a past surgical history that includes Shoulder surgery; Colonoscopy (11/17/2011); and Anal Rectal manometry (N/A, 04/27/2019).  Brief Summary:  Brenda Meyer is a 60 y.o. female with obstructive sleep apnea.      Subjective:   She uses CPAP nightly.  No issues with mask fit.  Not having sinus congestion, dry mouth.  Plans to get flu shot at PCP later this month.  Physical Exam:   Appearance - well kempt   ENMT - no sinus tenderness, no oral exudate, no LAN, Mallampati 3 airway, no stridor, poor dentition  Respiratory - equal breath sounds bilaterally, no wheezing or rales  CV - s1s2 regular rate and rhythm, no murmurs  Ext - no clubbing, no edema  Skin - no rashes  Psych - normal mood and affect    Sleep Tests:  PSG 2012 >> AHI 29 Auto CPAP 04/08/21 to 05/07/21 >> used on 30 of 30 nights with average 8 hrs 52 min.  Average AHI 1.6 with median CPAP 10 and 95 th percentile CPAP 13 cm H2O  Social History:  She  reports that she quit smoking about 10 years ago. Her smoking use included cigarettes. She has a 30.00 pack-year smoking history. She has never used smokeless tobacco. She reports that she does not drink alcohol and does not use drugs.  Family History:  Her family history includes Brain cancer in her brother; Heart disease in her father and mother; Lung cancer in her sister.     Assessment/Plan:   Obstructive sleep apnea. - she is compliant with CPAP - she uses [employer] for her DME - her current machine is more than 61 years old - will arrange for new Resmed 11 auto CPAP with  pressure range 5 to 15 cm H2O  Time Spent Involved in Patient Care on Day of Examination:  22 minutes  Follow up:   Patient Instructions  Will have [employer] arrange for new Resmed auto CPAP machine  Follow up in 6 months  Medication List:   Allergies as of 05/08/2021       Reactions   Ace Inhibitors Swelling, Cough        Medication List        Accurate as of May 08, 2021  9:34 AM. If you have any questions, ask your nurse or doctor.          STOP taking these medications    cephALEXin 500 MG capsule Commonly known as: KEFLEX Stopped by: May 10, 2021, MD   linaclotide 290 MCG Caps capsule Commonly known as: LINZESS Stopped by: Coralyn Helling, MD   predniSONE 5 MG (21) Tbpk tablet Commonly known as: STERAPRED UNI-PAK 21 TAB Stopped by: Coralyn Helling, MD       TAKE these medications    aspirin EC 81 MG tablet Take 1 tablet (81 mg total) by mouth daily.   atorvastatin 20 MG tablet Commonly known as: LIPITOR TAKE 1 TABLET BY MOUTH ONCE DAILY   CALCIUM + D PO Take  600 mg by mouth 2 (two) times daily.   cyclobenzaprine 10 MG tablet Commonly known as: FLEXERIL Take 1 tablet (10 mg total) by mouth at bedtime.   diphenhydrAMINE 25 MG tablet Commonly known as: BENADRYL Take 25 mg by mouth at bedtime as needed for sleep.   docusate sodium 100 MG capsule Commonly known as: COLACE Take 100 mg by mouth 2 (two) times daily.   DULoxetine 60 MG capsule Commonly known as: CYMBALTA Take 1 capsule (60 mg total) by mouth daily.   Jardiance 10 MG Tabs tablet Generic drug: empagliflozin TAKE 1 TABLET BY MOUTH ONCE A DAY (NEEDS APPT)   levothyroxine 50 MCG tablet Commonly known as: SYNTHROID TAKE 1 TABLET BY MOUTH ONCE DAILY   losartan 50 MG tablet Commonly known as: COZAAR TAKE 1 TABLET BY MOUTH ONCE DAILY   lubiprostone 24 MCG capsule Commonly known as: Amitiza Take 1 capsule (24 mcg total) by mouth 2 (two) times daily with a meal.   metFORMIN 1000 MG  tablet Commonly known as: GLUCOPHAGE TAKE 1 TABLET BY MOUTH TWO TIMES DAILY WITH A MEAL   pantoprazole 20 MG tablet Commonly known as: PROTONIX TAKE 1 TABLET BY MOUTH DAILY.   Vitamin D (Ergocalciferol) 1.25 MG (50000 UNIT) Caps capsule Commonly known as: DRISDOL TAKE 1 CAPSULE BY MOUTH EVERY 7 DAYS        Signature:  Coralyn Helling, MD [facility] Pulmonary/Critical Care Pager - [phone] 05/08/2021, 9:34 AM

## 2021-05-08 NOTE — Patient Instructions (Signed)
Will have [employer] arrange for new Resmed auto CPAP machine  Follow up in 6 months

## 2021-05-11 ENCOUNTER — Other Ambulatory Visit (HOSPITAL_COMMUNITY): Payer: Self-pay

## 2021-05-11 ENCOUNTER — Other Ambulatory Visit: Payer: Self-pay | Admitting: Family

## 2021-05-11 DIAGNOSIS — E785 Hyperlipidemia, unspecified: Secondary | ICD-10-CM

## 2021-05-11 DIAGNOSIS — E559 Vitamin D deficiency, unspecified: Secondary | ICD-10-CM

## 2021-05-11 DIAGNOSIS — E7800 Pure hypercholesterolemia, unspecified: Secondary | ICD-10-CM

## 2021-05-13 ENCOUNTER — Other Ambulatory Visit (HOSPITAL_COMMUNITY): Payer: Self-pay

## 2021-05-13 MED ORDER — ATORVASTATIN CALCIUM 20 MG PO TABS
ORAL_TABLET | Freq: Every day | ORAL | 0 refills | Status: DC
Start: 2021-05-13 — End: 2021-08-02
  Filled 2021-05-13: qty 90, 90d supply, fill #0

## 2021-05-13 MED ORDER — VITAMIN D (ERGOCALCIFEROL) 1.25 MG (50000 UNIT) PO CAPS
ORAL_CAPSULE | ORAL | 0 refills | Status: DC
Start: 2021-05-13 — End: 2021-08-02
  Filled 2021-05-13: qty 12, 84d supply, fill #0

## 2021-05-13 NOTE — Telephone Encounter (Signed)
Patients last vitamin d level was 07/29/2016 please review

## 2021-05-20 NOTE — Telephone Encounter (Signed)
Have you received any FMLA papers?

## 2021-05-22 ENCOUNTER — Other Ambulatory Visit: Payer: Self-pay

## 2021-05-22 ENCOUNTER — Encounter: Payer: Self-pay | Admitting: Family

## 2021-05-22 ENCOUNTER — Ambulatory Visit (INDEPENDENT_AMBULATORY_CARE_PROVIDER_SITE_OTHER): Payer: No Typology Code available for payment source | Admitting: Family

## 2021-05-22 VITALS — BP 140/77 | HR 74 | Temp 97.5°F | Ht 67.0 in | Wt 209.2 lb

## 2021-05-22 DIAGNOSIS — I10 Essential (primary) hypertension: Secondary | ICD-10-CM | POA: Diagnosis not present

## 2021-05-22 DIAGNOSIS — Z029 Encounter for administrative examinations, unspecified: Secondary | ICD-10-CM

## 2021-05-22 DIAGNOSIS — E1169 Type 2 diabetes mellitus with other specified complication: Secondary | ICD-10-CM

## 2021-05-22 DIAGNOSIS — G4733 Obstructive sleep apnea (adult) (pediatric): Secondary | ICD-10-CM

## 2021-05-22 DIAGNOSIS — I700 Atherosclerosis of aorta: Secondary | ICD-10-CM | POA: Diagnosis not present

## 2021-05-22 DIAGNOSIS — Z0001 Encounter for general adult medical examination with abnormal findings: Secondary | ICD-10-CM

## 2021-05-22 DIAGNOSIS — E039 Hypothyroidism, unspecified: Secondary | ICD-10-CM

## 2021-05-22 DIAGNOSIS — K5904 Chronic idiopathic constipation: Secondary | ICD-10-CM

## 2021-05-22 DIAGNOSIS — Z23 Encounter for immunization: Secondary | ICD-10-CM

## 2021-05-22 DIAGNOSIS — M546 Pain in thoracic spine: Secondary | ICD-10-CM

## 2021-05-22 DIAGNOSIS — E785 Hyperlipidemia, unspecified: Secondary | ICD-10-CM

## 2021-05-22 DIAGNOSIS — F419 Anxiety disorder, unspecified: Secondary | ICD-10-CM

## 2021-05-22 DIAGNOSIS — G8929 Other chronic pain: Secondary | ICD-10-CM

## 2021-05-22 DIAGNOSIS — E669 Obesity, unspecified: Secondary | ICD-10-CM

## 2021-05-22 DIAGNOSIS — Z87891 Personal history of nicotine dependence: Secondary | ICD-10-CM

## 2021-05-22 DIAGNOSIS — F32A Depression, unspecified: Secondary | ICD-10-CM

## 2021-05-22 DIAGNOSIS — E559 Vitamin D deficiency, unspecified: Secondary | ICD-10-CM

## 2021-05-22 DIAGNOSIS — Z0000 Encounter for general adult medical examination without abnormal findings: Secondary | ICD-10-CM

## 2021-05-22 LAB — BAYER DCA HB A1C WAIVED: HB A1C (BAYER DCA - WAIVED): 7 % — ABNORMAL HIGH (ref 4.8–5.6)

## 2021-05-22 NOTE — Patient Instructions (Signed)
Health Maintenance, Female Adopting a healthy lifestyle and getting preventive care are important in promoting health and wellness. Ask your health care provider about: The right schedule for you to have regular tests and exams. Things you can do on your own to prevent diseases and keep yourself healthy. What should I know about diet, weight, and exercise? Eat a healthy diet  Eat a diet that includes plenty of vegetables, fruits, low-fat dairy products, and lean protein. Do not eat a lot of foods that are high in solid fats, added sugars, or sodium. Maintain a healthy weight Body mass index (BMI) is used to identify weight problems. It estimates body fat based on height and weight. Your health care provider can help determine your BMI and help you achieve or maintain a healthy weight. Get regular exercise Get regular exercise. This is one of the most important things you can do for your health. Most adults should: Exercise for at least 150 minutes each week. The exercise should increase your heart rate and make you sweat (moderate-intensity exercise). Do strengthening exercises at least twice a week. This is in addition to the moderate-intensity exercise. Spend less time sitting. Even light physical activity can be beneficial. Watch cholesterol and blood lipids Have your blood tested for lipids and cholesterol at 61 years of age, then have this test every 5 years. Have your cholesterol levels checked more often if: Your lipid or cholesterol levels are high. You are older than 61 years of age. You are at high risk for heart disease. What should I know about cancer screening? Depending on your health history and family history, you may need to have cancer screening at various ages. This may include screening for: Breast cancer. Cervical cancer. Colorectal cancer. Skin cancer. Lung cancer. What should I know about heart disease, diabetes, and high blood pressure? Blood pressure and heart  disease High blood pressure causes heart disease and increases the risk of stroke. This is more likely to develop in people who have high blood pressure readings, are of African descent, or are overweight. Have your blood pressure checked: Every 3-5 years if you are 18-39 years of age. Every year if you are 40 years old or older. Diabetes Have regular diabetes screenings. This checks your fasting blood sugar level. Have the screening done: Once every three years after age 40 if you are at a normal weight and have a low risk for diabetes. More often and at a younger age if you are overweight or have a high risk for diabetes. What should I know about preventing infection? Hepatitis B If you have a higher risk for hepatitis B, you should be screened for this virus. Talk with your health care provider to find out if you are at risk for hepatitis B infection. Hepatitis C Testing is recommended for: Everyone born from 1945 through 1965. Anyone with known risk factors for hepatitis C. Sexually transmitted infections (STIs) Get screened for STIs, including gonorrhea and chlamydia, if: You are sexually active and are younger than 61 years of age. You are older than 61 years of age and your health care provider tells you that you are at risk for this type of infection. Your sexual activity has changed since you were last screened, and you are at increased risk for chlamydia or gonorrhea. Ask your health care provider if you are at risk. Ask your health care provider about whether you are at high risk for HIV. Your health care provider may recommend a prescription medicine   to help prevent HIV infection. If you choose to take medicine to prevent HIV, you should first get tested for HIV. You should then be tested every 3 months for as long as you are taking the medicine. Pregnancy If you are about to stop having your period (premenopausal) and you may become pregnant, seek counseling before you get  pregnant. Take 400 to 800 micrograms (mcg) of folic acid every day if you become pregnant. Ask for birth control (contraception) if you want to prevent pregnancy. Osteoporosis and menopause Osteoporosis is a disease in which the bones lose minerals and strength with aging. This can result in bone fractures. If you are 65 years old or older, or if you are at risk for osteoporosis and fractures, ask your health care provider if you should: Be screened for bone loss. Take a calcium or vitamin D supplement to lower your risk of fractures. Be given hormone replacement therapy (HRT) to treat symptoms of menopause. Follow these instructions at home: Lifestyle Do not use any products that contain nicotine or tobacco, such as cigarettes, e-cigarettes, and chewing tobacco. If you need help quitting, ask your health care provider. Do not use street drugs. Do not share needles. Ask your health care provider for help if you need support or information about quitting drugs. Alcohol use Do not drink alcohol if: Your health care provider tells you not to drink. You are pregnant, may be pregnant, or are planning to become pregnant. If you drink alcohol: Limit how much you use to 0-1 drink a day. Limit intake if you are breastfeeding. Be aware of how much alcohol is in your drink. In the [place] one drink equals one 12 oz bottle of beer (355 mL), one 5 oz glass of wine (148 mL), or one 1 oz glass of hard liquor (44 mL). General instructions Schedule regular health, dental, and eye exams. Stay current with your vaccines. Tell your health care provider if: You often feel depressed. You have ever been abused or do not feel safe at home. Summary Adopting a healthy lifestyle and getting preventive care are important in promoting health and wellness. Follow your health care provider's instructions about healthy diet, exercising, and getting tested or screened for diseases. Follow your health care provider's  instructions on monitoring your cholesterol and blood pressure. This information is not intended to replace advice given to you by your health care provider. Make sure you discuss any questions you have with your health care provider. Document Revised: 10/26/2020 Document Reviewed: 08/11/2018 Elsevier Patient Education  2022 [employer]  

## 2021-05-22 NOTE — Progress Notes (Signed)
Subjective:    Patient ID: Brenda Meyer, female    DOB: 1961/01/26, 60 y.o.   MRN: [id number]  Chief Complaint  Patient presents with   Medical Management of Chronic Issues   Pt presents to the office today for CPE and chronic follow up.  Pt is followed by Pulmonologist annually for CPAP. Followed by GYN annually for pap and mammogram.  Diabetes She presents for her follow-up diabetic visit. She has type 2 diabetes mellitus. Associated symptoms include fatigue. Pertinent negatives for diabetes include no blurred vision and no foot paresthesias. There are no hypoglycemic complications. Symptoms are stable. Diabetic complications include heart disease. Risk factors for coronary artery disease include dyslipidemia, diabetes mellitus and hypertension. She is following a generally healthy diet. Her overall blood glucose range is 140-180 mg/dl. An ACE inhibitor/angiotensin II receptor blocker is being taken.  Hypertension This is a chronic problem. The current episode started more than 1 year ago. The problem has been waxing and waning since onset. The problem is controlled. Pertinent negatives include no blurred vision, malaise/fatigue or peripheral edema. Risk factors for coronary artery disease include dyslipidemia, diabetes mellitus and obesity. The current treatment provides moderate improvement. Identifiable causes of hypertension include a thyroid problem.  Thyroid Problem Presents for follow-up visit. Symptoms include constipation and fatigue. The symptoms have been stable. Her past medical history is significant for hyperlipidemia.  Hyperlipidemia This is a chronic problem. The current episode started more than 1 year ago. Exacerbating diseases include obesity. Current antihyperlipidemic treatment includes statins. The current treatment provides moderate improvement of lipids. Risk factors for coronary artery disease include hypertension, diabetes mellitus and dyslipidemia.  Back Pain This  is a chronic problem. The current episode started more than 1 year ago. The problem has been waxing and waning since onset. The pain is present in the lumbar spine. The pain is at a severity of 0/10 (when it flares up it is 5). The patient is experiencing no pain.  Constipation Associated symptoms include back pain.     Review of Systems  Constitutional:  Positive for fatigue. Negative for malaise/fatigue.  Eyes:  Negative for blurred vision.  Gastrointestinal:  Positive for constipation.  Musculoskeletal:  Positive for back pain.  All other systems reviewed and are negative.  Family History  Problem Relation Age of Onset   Heart disease Mother    Heart disease Father    Brain cancer Brother    Lung cancer Sister    Social History   Socioeconomic History   Marital status: Married    Spouse name: Not on file   Number of children: Not on file   Years of education: Not on file   Highest education level: Not on file  Occupational History   Occupation: nurse    Employer:   Tobacco Use   Smoking status: Former    Packs/day: 1.00    Years: 30.00    Pack years: 30.00    Types: Cigarettes    Quit date: 10/02/2010    Years since quitting: 10.6   Smokeless tobacco: Never  Vaping Use   Vaping Use: Never used  Substance and Sexual Activity   Alcohol use: No   Drug use: Never   Sexual activity: Not on file  Other Topics Concern   Not on file  Social History Narrative   Not on file   Social Determinants of Health   Financial Resource Strain: Not on file  Food Insecurity: Not on file  Transportation Needs: Not  on file  Physical Activity: Not on file  Stress: Not on file  Social Connections: Not on file       Objective:   Physical Exam Vitals reviewed.  Constitutional:      General: She is not in acute distress.    Appearance: She is well-developed. She is obese.  HENT:     Head: Normocephalic and atraumatic.     Right Ear: Tympanic membrane normal.      Left Ear: Tympanic membrane normal.  Eyes:     Pupils: Pupils are equal, round, and reactive to light.  Neck:     Thyroid: No thyromegaly.  Cardiovascular:     Rate and Rhythm: Normal rate and regular rhythm.     Heart sounds: Normal heart sounds. No murmur heard. Pulmonary:     Effort: Pulmonary effort is normal. No respiratory distress.     Breath sounds: Normal breath sounds. No wheezing.  Abdominal:     General: Bowel sounds are normal. There is no distension.     Palpations: Abdomen is soft.     Tenderness: There is no abdominal tenderness.  Musculoskeletal:        General: No tenderness. Normal range of motion.     Cervical back: Normal range of motion and neck supple.  Skin:    General: Skin is warm and dry.  Neurological:     Mental Status: She is alert and oriented to person, place, and time.     Cranial Nerves: No cranial nerve deficit.     Deep Tendon Reflexes: Reflexes are normal and symmetric.  Psychiatric:        Behavior: Behavior normal.        Thought Content: Thought content normal.        Judgment: Judgment normal.      BP 140/77   Pulse 74   Temp (!) 97.5 F (36.4 C) (Temporal)   Ht _0  (1.702 m)   Wt 209 lb 3.2 oz (94.9 kg)   BMI 32.77 kg/m      Assessment & Plan:  Brenda Meyer comes in today with chief complaint of Medical Management of Chronic Issues   Diagnosis and orders addressed:  1. Essential hypertension - CMP14+EGFR - CBC with Differential/Platelet  2. Atherosclerosis of aorta (HCC) - CMP14+EGFR - CBC with Differential/Platelet  3. OSA (obstructive sleep apnea) - CMP14+EGFR - CBC with Differential/Platelet - TSH  4. Hypothyroidism, unspecified type - CMP14+EGFR - CBC with Differential/Platelet  5. Type 2 diabetes mellitus with other specified complication, without long-term current use of insulin (HCC) - Bayer DCA Hb A1c Waived - CMP14+EGFR - CBC with Differential/Platelet  6. Obesity (BMI 30-39.9) -  CMP14+EGFR - CBC with Differential/Platelet  7. Vitamin D deficiency - CMP14+EGFR - CBC with Differential/Platelet  8. Hyperlipidemia, unspecified hyperlipidemia type - CMP14+EGFR - CBC with Differential/Platelet  9. Chronic midline thoracic back pain - CMP14+EGFR - CBC with Differential/Platelet  10. Chronic idiopathic constipation - CMP14+EGFR - CBC with Differential/Platelet  11. Hx of smoking - CMP14+EGFR - CBC with Differential/Platelet  12. Anxiety and depression - CMP14+EGFR - CBC with Differential/Platelet  13. Need for immunization against influenza  - Flu Vaccine QUAD 11moIM (Fluarix, Fluzone & Alfiuria Quad PF) - CMP14+EGFR - CBC with Differential/Platelet  14. Annual physical exam - Bayer DAtlanticHb A1c Waived - CMP14+EGFR - CBC with Differential/Platelet - TSH   Labs pending Health Maintenance reviewed Diet and exercise encouraged  Follow up plan: 6 months    CEvelina Dun  FNP

## 2021-05-23 LAB — CMP14+EGFR
ALT: 10 IU/L (ref 0–32)
AST: 15 IU/L (ref 0–40)
Albumin/Globulin Ratio: 1.6 (ref 1.2–2.2)
Albumin: 4.1 g/dL (ref 3.8–4.8)
Alkaline Phosphatase: 148 IU/L — ABNORMAL HIGH (ref 44–121)
BUN/Creatinine Ratio: 11 — ABNORMAL LOW (ref 12–28)
BUN: 8 mg/dL (ref 8–27)
Bilirubin Total: 0.3 mg/dL (ref 0.0–1.2)
CO2: 24 mmol/L (ref 20–29)
Calcium: 10 mg/dL (ref 8.7–10.3)
Chloride: 99 mmol/L (ref 96–106)
Creatinine, Ser: 0.74 mg/dL (ref 0.57–1.00)
Globulin, Total: 2.5 g/dL (ref 1.5–4.5)
Glucose: 97 mg/dL (ref 65–99)
Potassium: 4 mmol/L (ref 3.5–5.2)
Sodium: 140 mmol/L (ref 134–144)
Total Protein: 6.6 g/dL (ref 6.0–8.5)
eGFR: 92 mL/min/{1.73_m2} (ref >59)

## 2021-05-23 LAB — CBC WITH DIFFERENTIAL/PLATELET
Basophils Absolute: 0.1 10*3/uL (ref 0.0–0.2)
Basos: 1 %
EOS (ABSOLUTE): 0.2 10*3/uL (ref 0.0–0.4)
Eos: 3 %
Hematocrit: 36.4 % (ref 34.0–46.6)
Hemoglobin: 11.8 g/dL (ref 11.1–15.9)
Immature Grans (Abs): 0 10*3/uL (ref 0.0–0.1)
Immature Granulocytes: 1 %
Lymphocytes Absolute: 2.8 10*3/uL (ref 0.7–3.1)
Lymphs: 34 %
MCH: 26.3 pg — ABNORMAL LOW (ref 26.6–33.0)
MCHC: 32.4 g/dL (ref 31.5–35.7)
MCV: 81 fL (ref 79–97)
Monocytes Absolute: 0.6 10*3/uL (ref 0.1–0.9)
Monocytes: 7 %
Neutrophils Absolute: 4.5 10*3/uL (ref 1.4–7.0)
Neutrophils: 54 %
Platelets: 312 10*3/uL (ref 150–450)
RBC: 4.48 x10E6/uL (ref 3.77–5.28)
RDW: 15.8 % — ABNORMAL HIGH (ref 11.7–15.4)
WBC: 8.2 10*3/uL (ref 3.4–10.8)

## 2021-05-23 LAB — TSH: TSH: 2.76 u[IU]/mL (ref 0.450–4.500)

## 2021-05-25 ENCOUNTER — Other Ambulatory Visit: Payer: Self-pay | Admitting: Family

## 2021-05-25 DIAGNOSIS — E119 Type 2 diabetes mellitus without complications: Secondary | ICD-10-CM

## 2021-05-27 ENCOUNTER — Other Ambulatory Visit (HOSPITAL_COMMUNITY): Payer: Self-pay

## 2021-05-27 MED ORDER — EMPAGLIFLOZIN 10 MG PO TABS
10.0000 mg | ORAL_TABLET | Freq: Every day | ORAL | 0 refills | Status: DC
  Filled 2021-05-27: qty 90, 90d supply, fill #0

## 2021-05-27 MED ORDER — METFORMIN HCL 1000 MG PO TABS
ORAL_TABLET | Freq: Two times a day (BID) | ORAL | 0 refills | Status: DC
  Filled 2021-05-27: qty 180, 90d supply, fill #0

## 2021-06-12 ENCOUNTER — Other Ambulatory Visit (HOSPITAL_COMMUNITY): Payer: Self-pay

## 2021-07-05 ENCOUNTER — Other Ambulatory Visit (HOSPITAL_COMMUNITY): Payer: Self-pay

## 2021-07-05 ENCOUNTER — Other Ambulatory Visit: Payer: Self-pay | Admitting: Family

## 2021-07-05 DIAGNOSIS — K5904 Chronic idiopathic constipation: Secondary | ICD-10-CM

## 2021-07-05 MED ORDER — LUBIPROSTONE 24 MCG PO CAPS
24.0000 ug | ORAL_CAPSULE | Freq: Two times a day (BID) | ORAL | 0 refills | Status: DC
  Filled 2021-07-05: qty 180, 90d supply, fill #0

## 2021-07-11 ENCOUNTER — Other Ambulatory Visit (HOSPITAL_COMMUNITY): Payer: Self-pay

## 2021-07-11 ENCOUNTER — Other Ambulatory Visit: Payer: Self-pay | Admitting: Family

## 2021-07-11 MED ORDER — MELOXICAM 15 MG PO TABS
15.0000 mg | ORAL_TABLET | Freq: Every day | ORAL | 2 refills | Status: DC
  Filled 2021-07-11: qty 30, 30d supply, fill #0
  Filled 2021-08-02: qty 30, 30d supply, fill #1

## 2021-08-02 ENCOUNTER — Other Ambulatory Visit: Payer: Self-pay | Admitting: Family

## 2021-08-02 ENCOUNTER — Other Ambulatory Visit (HOSPITAL_COMMUNITY): Payer: Self-pay

## 2021-08-02 DIAGNOSIS — E785 Hyperlipidemia, unspecified: Secondary | ICD-10-CM

## 2021-08-02 DIAGNOSIS — E559 Vitamin D deficiency, unspecified: Secondary | ICD-10-CM

## 2021-08-02 DIAGNOSIS — E7800 Pure hypercholesterolemia, unspecified: Secondary | ICD-10-CM

## 2021-08-02 MED ORDER — VITAMIN D (ERGOCALCIFEROL) 1.25 MG (50000 UNIT) PO CAPS
ORAL_CAPSULE | ORAL | 0 refills | Status: DC
  Filled 2021-08-02: qty 12, 84d supply, fill #0

## 2021-08-02 MED ORDER — ATORVASTATIN CALCIUM 20 MG PO TABS
ORAL_TABLET | Freq: Every day | ORAL | 0 refills | Status: DC
  Filled 2021-08-02: qty 90, 90d supply, fill #0

## 2021-08-05 ENCOUNTER — Other Ambulatory Visit (HOSPITAL_COMMUNITY): Payer: Self-pay

## 2021-08-15 ENCOUNTER — Other Ambulatory Visit (HOSPITAL_COMMUNITY): Payer: Self-pay

## 2021-08-15 ENCOUNTER — Other Ambulatory Visit: Payer: Self-pay | Admitting: Family

## 2021-08-15 DIAGNOSIS — E119 Type 2 diabetes mellitus without complications: Secondary | ICD-10-CM

## 2021-08-15 MED ORDER — METFORMIN HCL 1000 MG PO TABS
ORAL_TABLET | Freq: Two times a day (BID) | ORAL | 0 refills | Status: DC
  Filled 2021-08-15: qty 180, 90d supply, fill #0

## 2021-09-01 ENCOUNTER — Other Ambulatory Visit: Payer: Self-pay | Admitting: Family

## 2021-09-03 ENCOUNTER — Other Ambulatory Visit (HOSPITAL_COMMUNITY): Payer: Self-pay

## 2021-09-03 MED ORDER — EMPAGLIFLOZIN 10 MG PO TABS
10.0000 mg | ORAL_TABLET | Freq: Every day | ORAL | 0 refills | Status: DC
  Filled 2021-09-03: qty 90, 90d supply, fill #0

## 2021-09-03 MED ORDER — DULOXETINE HCL 60 MG PO CPEP
60.0000 mg | ORAL_CAPSULE | Freq: Every day | ORAL | 1 refills | Status: DC
  Filled 2021-09-03: qty 90, 90d supply, fill #0
  Filled 2021-11-27: qty 90, 90d supply, fill #1

## 2021-10-02 ENCOUNTER — Other Ambulatory Visit: Payer: Self-pay | Admitting: Family

## 2021-10-02 ENCOUNTER — Other Ambulatory Visit (HOSPITAL_COMMUNITY): Payer: Self-pay

## 2021-10-02 DIAGNOSIS — G8929 Other chronic pain: Secondary | ICD-10-CM

## 2021-10-03 ENCOUNTER — Other Ambulatory Visit (HOSPITAL_COMMUNITY): Payer: Self-pay

## 2021-10-04 ENCOUNTER — Other Ambulatory Visit (HOSPITAL_COMMUNITY): Payer: Self-pay

## 2021-10-04 MED ORDER — CYCLOBENZAPRINE HCL 10 MG PO TABS
10.0000 mg | ORAL_TABLET | Freq: Every day | ORAL | 2 refills | Status: DC
  Filled 2021-10-04: qty 90, 90d supply, fill #0
  Filled 2021-12-26: qty 90, 90d supply, fill #1
  Filled 2022-02-26 – 2022-03-27 (×2): qty 90, 90d supply, fill #2

## 2021-10-26 ENCOUNTER — Other Ambulatory Visit (HOSPITAL_COMMUNITY): Payer: Self-pay

## 2021-10-26 ENCOUNTER — Other Ambulatory Visit: Payer: Self-pay | Admitting: Family

## 2021-10-26 DIAGNOSIS — E559 Vitamin D deficiency, unspecified: Secondary | ICD-10-CM

## 2021-10-26 DIAGNOSIS — E785 Hyperlipidemia, unspecified: Secondary | ICD-10-CM

## 2021-10-26 DIAGNOSIS — E7800 Pure hypercholesterolemia, unspecified: Secondary | ICD-10-CM

## 2021-10-26 MED ORDER — ATORVASTATIN CALCIUM 20 MG PO TABS
ORAL_TABLET | Freq: Every day | ORAL | 0 refills | Status: DC
  Filled 2021-10-26: qty 90, 90d supply, fill #0

## 2021-10-26 MED ORDER — VITAMIN D (ERGOCALCIFEROL) 1.25 MG (50000 UNIT) PO CAPS
ORAL_CAPSULE | ORAL | 0 refills | Status: DC
  Filled 2021-10-26: qty 12, 84d supply, fill #0

## 2021-11-18 ENCOUNTER — Other Ambulatory Visit: Payer: Self-pay

## 2021-11-18 ENCOUNTER — Other Ambulatory Visit (HOSPITAL_COMMUNITY): Payer: Self-pay

## 2021-11-18 ENCOUNTER — Other Ambulatory Visit: Payer: Self-pay | Admitting: Family

## 2021-11-18 MED ORDER — CEPHALEXIN 500 MG PO CAPS: 500.0000 mg | ORAL_CAPSULE | Freq: Two times a day (BID) | ORAL | 0 refills | Status: DC

## 2021-11-18 MED ORDER — CEPHALEXIN 500 MG PO CAPS
500.0000 mg | ORAL_CAPSULE | Freq: Two times a day (BID) | ORAL | 0 refills | Status: DC
  Filled 2021-11-18: qty 14, 7d supply, fill #0

## 2021-11-20 ENCOUNTER — Other Ambulatory Visit (HOSPITAL_COMMUNITY): Payer: Self-pay

## 2021-11-20 ENCOUNTER — Other Ambulatory Visit: Payer: Self-pay | Admitting: Family

## 2021-11-20 DIAGNOSIS — E119 Type 2 diabetes mellitus without complications: Secondary | ICD-10-CM

## 2021-11-20 MED ORDER — METFORMIN HCL 1000 MG PO TABS
ORAL_TABLET | Freq: Two times a day (BID) | ORAL | 0 refills | Status: DC
  Filled 2021-11-20: qty 180, 90d supply, fill #0

## 2021-11-22 ENCOUNTER — Ambulatory Visit (INDEPENDENT_AMBULATORY_CARE_PROVIDER_SITE_OTHER): Payer: No Typology Code available for payment source | Admitting: Family

## 2021-11-22 ENCOUNTER — Encounter: Payer: Self-pay | Admitting: Family

## 2021-11-22 VITALS — BP 137/78 | HR 83 | Temp 97.3°F | Ht 67.0 in | Wt 205.8 lb

## 2021-11-22 DIAGNOSIS — G4733 Obstructive sleep apnea (adult) (pediatric): Secondary | ICD-10-CM

## 2021-11-22 DIAGNOSIS — Z23 Encounter for immunization: Secondary | ICD-10-CM

## 2021-11-22 DIAGNOSIS — E669 Obesity, unspecified: Secondary | ICD-10-CM

## 2021-11-22 DIAGNOSIS — E1169 Type 2 diabetes mellitus with other specified complication: Secondary | ICD-10-CM

## 2021-11-22 DIAGNOSIS — I10 Essential (primary) hypertension: Secondary | ICD-10-CM | POA: Diagnosis not present

## 2021-11-22 DIAGNOSIS — F411 Generalized anxiety disorder: Secondary | ICD-10-CM

## 2021-11-22 DIAGNOSIS — E785 Hyperlipidemia, unspecified: Secondary | ICD-10-CM

## 2021-11-22 DIAGNOSIS — I700 Atherosclerosis of aorta: Secondary | ICD-10-CM

## 2021-11-22 DIAGNOSIS — E039 Hypothyroidism, unspecified: Secondary | ICD-10-CM

## 2021-11-22 DIAGNOSIS — F320 Major depressive disorder, single episode, mild: Secondary | ICD-10-CM

## 2021-11-22 LAB — BAYER DCA HB A1C WAIVED: HB A1C (BAYER DCA - WAIVED): 6.6 % — ABNORMAL HIGH (ref 4.8–5.6)

## 2021-11-22 NOTE — Progress Notes (Signed)
? ?Subjective:  ? ? Patient ID: Brenda Meyer, female    DOB: 12-19-1960, 61 y.o.   MRN: [id number] ? ?Chief Complaint  ?Patient presents with  ? Medical Management of Chronic Issues  ? ?Pt presents to the office today for chronic follow up.  Pt is followed by Pulmonologist annually for CPAP. Followed by GYN annually for pap and mammogram.  ?Diabetes ?She presents for her follow-up diabetic visit. She has type 2 diabetes mellitus. Hypoglycemia symptoms include nervousness/anxiousness. Associated symptoms include blurred vision, fatigue and foot paresthesias. Symptoms are stable. Diabetic complications include peripheral neuropathy. Pertinent negatives for diabetic complications include no CVA, heart disease or nephropathy. Risk factors for coronary artery disease include dyslipidemia, diabetes mellitus, hypertension and sedentary lifestyle. She is following a generally unhealthy diet. Her overall blood glucose range is 140-180 mg/dl. Eye exam is current.  ?Hypertension ?This is a chronic problem. The current episode started more than 1 year ago. The problem has been waxing and waning since onset. The problem is uncontrolled. Associated symptoms include anxiety, blurred vision and malaise/fatigue. Pertinent negatives include no peripheral edema or shortness of breath. Risk factors for coronary artery disease include dyslipidemia, obesity and sedentary lifestyle. The current treatment provides mild improvement. There is no history of CVA. Identifiable causes of hypertension include a thyroid problem.  ?Thyroid Problem ?Presents for follow-up visit. Symptoms include anxiety, constipation, depressed mood and fatigue. Patient reports no hoarse voice. The symptoms have been stable. Her past medical history is significant for hyperlipidemia.  ?Hyperlipidemia ?This is a chronic problem. The current episode started more than 1 year ago. The problem is controlled. Exacerbating diseases include obesity. Pertinent negatives  include no shortness of breath. Current antihyperlipidemic treatment includes statins. The current treatment provides moderate improvement of lipids. Risk factors for coronary artery disease include dyslipidemia, diabetes mellitus, hypertension, a sedentary lifestyle and post-menopausal.  ?Anxiety ?Presents for follow-up visit. Symptoms include depressed mood, excessive worry, irritability, nervous/anxious behavior and restlessness. Patient reports no shortness of breath. Symptoms occur most days. The severity of symptoms is moderate.  ? ? ?Depression ?       This is a chronic problem.  The current episode started more than 1 year ago.   The onset quality is gradual.   The problem occurs intermittently.  Associated symptoms include fatigue, irritable and restlessness.  Associated symptoms include no helplessness, no hopelessness and not sad.  Past treatments include SNRIs - Serotonin and norepinephrine reuptake inhibitors.  Past medical history includes thyroid problem and anxiety.   ?Constipation ?This is a chronic problem. The current episode started more than 1 year ago. Her stool frequency is 4 to 5 times per week. She has tried stool softeners for the symptoms. The treatment provided moderate relief.  ? ? ? ?Review of Systems  ?Constitutional:  Positive for fatigue, irritability and malaise/fatigue.  ?HENT:  Negative for hoarse voice.   ?Eyes:  Positive for blurred vision.  ?Respiratory:  Negative for shortness of breath.   ?Gastrointestinal:  Positive for constipation.  ?Psychiatric/Behavioral:  Positive for depression. The patient is nervous/anxious.   ?All other systems reviewed and are negative. ? ?   ?Objective:  ? Physical Exam ?Vitals reviewed.  ?Constitutional:   ?   General: She is irritable. She is not in acute distress. ?   Appearance: She is well-developed. She is obese.  ?HENT:  ?   Head: Normocephalic and atraumatic.  ?   Right Ear: Tympanic membrane normal.  ?   Left Ear: Tympanic membrane  normal.  ?Eyes:  ?   Pupils: Pupils are equal, round, and reactive to light.  ?Neck:  ?   Thyroid: No thyromegaly.  ?Cardiovascular:  ?   Rate and Rhythm: Normal rate and regular rhythm.  ?   Heart sounds: Normal heart sounds. No murmur heard. ?Pulmonary:  ?   Effort: Pulmonary effort is normal. No respiratory distress.  ?   Breath sounds: Normal breath sounds. No wheezing.  ?Abdominal:  ?   General: Bowel sounds are normal. There is no distension.  ?   Palpations: Abdomen is soft.  ?   Tenderness: There is no abdominal tenderness.  ?Musculoskeletal:     ?   General: No tenderness. Normal range of motion.  ?   Cervical back: Normal range of motion and neck supple.  ?Skin: ?   General: Skin is warm and dry.  ?Neurological:  ?   Mental Status: She is alert and oriented to person, place, and time.  ?   Cranial Nerves: No cranial nerve deficit.  ?   Deep Tendon Reflexes: Reflexes are normal and symmetric.  ?Psychiatric:     ?   Behavior: Behavior normal.     ?   Thought Content: Thought content normal.     ?   Judgment: Judgment normal.  ? ? ?BP 137/78 Comment: patient reports at home  Pulse 83   Temp (!) 97.3 ?F (36.3 ?C) (Temporal)   Ht _0  (1.702 m)   Wt 205 lb 12.8 oz (93.4 kg)   BMI 32.23 kg/m?  ? ?   ?Assessment & Plan:  ?Brenda Meyer comes in today with chief complaint of Medical Management of Chronic Issues ? ? ?Diagnosis and orders addressed: ? ?1. Essential hypertension ?- CMP14+EGFR ?- CBC with Differential/Platelet ? ?2. Atherosclerosis of aorta ([place]) ?- CMP14+EGFR ?- CBC with Differential/Platelet ? ?3. OSA (obstructive sleep apnea) ?- CMP14+EGFR ?- CBC with Differential/Platelet ? ?4. Type 2 diabetes mellitus with other specified complication, without long-term current use of insulin ([place]) ?- Bayer DCA Hb A1c Waived ?- CMP14+EGFR ?- CBC with Differential/Platelet ? ?5. Hypothyroidism, unspecified type ?- CMP14+EGFR ?- CBC with Differential/Platelet ?- TSH ? ?6. Obesity (BMI 30-39.9) ?-  CMP14+EGFR ?- CBC with Differential/Platelet ? ?7. Hyperlipidemia, unspecified hyperlipidemia type ? ?- CMP14+EGFR ?- CBC with Differential/Platelet ? ?8. Depression, major, single episode, mild ([place]) ?- CMP14+EGFR ?- CBC with Differential/Platelet ? ?9. GAD (generalized anxiety disorder) ?- CMP14+EGFR ?- CBC with Differential/Platelet ? ? ?Labs pending ?Health Maintenance reviewed ?Diet and exercise encouraged ? ?Follow up plan: ?3 months  ? ? ?Evelina Dun, FNP ? ? ? ?

## 2021-11-22 NOTE — Patient Instructions (Signed)

## 2021-11-23 LAB — CBC WITH DIFFERENTIAL/PLATELET
Basophils Absolute: 0.1 10*3/uL (ref 0.0–0.2)
Basos: 1 %
EOS (ABSOLUTE): 0.4 10*3/uL (ref 0.0–0.4)
Eos: 4 %
Hematocrit: 37.1 % (ref 34.0–46.6)
Hemoglobin: 12 g/dL (ref 11.1–15.9)
Immature Grans (Abs): 0 10*3/uL (ref 0.0–0.1)
Immature Granulocytes: 0 %
Lymphocytes Absolute: 1.5 10*3/uL (ref 0.7–3.1)
Lymphs: 15 %
MCH: 25.7 pg — ABNORMAL LOW (ref 26.6–33.0)
MCHC: 32.3 g/dL (ref 31.5–35.7)
MCV: 79 fL (ref 79–97)
Monocytes Absolute: 0.6 10*3/uL (ref 0.1–0.9)
Monocytes: 6 %
Neutrophils Absolute: 7.4 10*3/uL — ABNORMAL HIGH (ref 1.4–7.0)
Neutrophils: 74 %
Platelets: 327 10*3/uL (ref 150–450)
RBC: 4.67 x10E6/uL (ref 3.77–5.28)
RDW: 15 % (ref 11.7–15.4)
WBC: 10.1 10*3/uL (ref 3.4–10.8)

## 2021-11-23 LAB — CMP14+EGFR
ALT: 10 IU/L (ref 0–32)
AST: 11 IU/L (ref 0–40)
Albumin/Globulin Ratio: 1.6 (ref 1.2–2.2)
Albumin: 4.4 g/dL (ref 3.8–4.8)
Alkaline Phosphatase: 147 IU/L — ABNORMAL HIGH (ref 44–121)
BUN/Creatinine Ratio: 12 (ref 12–28)
BUN: 9 mg/dL (ref 8–27)
Bilirubin Total: 0.4 mg/dL (ref 0.0–1.2)
CO2: 23 mmol/L (ref 20–29)
Calcium: 10.1 mg/dL (ref 8.7–10.3)
Chloride: 97 mmol/L (ref 96–106)
Creatinine, Ser: 0.77 mg/dL (ref 0.57–1.00)
Globulin, Total: 2.7 g/dL (ref 1.5–4.5)
Glucose: 118 mg/dL — ABNORMAL HIGH (ref 70–99)
Potassium: 4.3 mmol/L (ref 3.5–5.2)
Sodium: 137 mmol/L (ref 134–144)
Total Protein: 7.1 g/dL (ref 6.0–8.5)
eGFR: 87 mL/min/{1.73_m2} (ref >59)

## 2021-11-23 LAB — TSH: TSH: 4.21 u[IU]/mL (ref 0.450–4.500)

## 2021-11-27 ENCOUNTER — Other Ambulatory Visit (HOSPITAL_COMMUNITY): Payer: Self-pay

## 2021-11-27 ENCOUNTER — Other Ambulatory Visit: Payer: Self-pay | Admitting: Family

## 2021-11-28 ENCOUNTER — Other Ambulatory Visit (HOSPITAL_COMMUNITY): Payer: Self-pay

## 2021-11-28 MED ORDER — EMPAGLIFLOZIN 10 MG PO TABS
10.0000 mg | ORAL_TABLET | Freq: Every day | ORAL | 0 refills | Status: DC
  Filled 2021-11-28: qty 90, 90d supply, fill #0

## 2021-12-26 ENCOUNTER — Other Ambulatory Visit (HOSPITAL_COMMUNITY): Payer: Self-pay

## 2021-12-26 ENCOUNTER — Other Ambulatory Visit: Payer: Self-pay | Admitting: Family

## 2021-12-26 DIAGNOSIS — E039 Hypothyroidism, unspecified: Secondary | ICD-10-CM

## 2021-12-26 DIAGNOSIS — I10 Essential (primary) hypertension: Secondary | ICD-10-CM

## 2021-12-26 MED ORDER — LEVOTHYROXINE SODIUM 50 MCG PO TABS
ORAL_TABLET | Freq: Every day | ORAL | 1 refills | Status: DC
  Filled 2021-12-26: qty 90, 90d supply, fill #0
  Filled 2022-03-27: qty 90, 90d supply, fill #1

## 2021-12-26 MED ORDER — LOSARTAN POTASSIUM 50 MG PO TABS
ORAL_TABLET | Freq: Every day | ORAL | 1 refills | Status: DC
  Filled 2021-12-26 – 2022-01-22 (×2): qty 90, 90d supply, fill #0
  Filled 2022-02-11 – 2022-04-30 (×2): qty 90, 90d supply, fill #1

## 2022-01-22 ENCOUNTER — Other Ambulatory Visit (HOSPITAL_COMMUNITY): Payer: Self-pay

## 2022-01-22 ENCOUNTER — Other Ambulatory Visit: Payer: Self-pay | Admitting: Family

## 2022-01-22 DIAGNOSIS — E785 Hyperlipidemia, unspecified: Secondary | ICD-10-CM

## 2022-01-22 DIAGNOSIS — E559 Vitamin D deficiency, unspecified: Secondary | ICD-10-CM

## 2022-01-22 DIAGNOSIS — E7800 Pure hypercholesterolemia, unspecified: Secondary | ICD-10-CM

## 2022-01-22 MED ORDER — ATORVASTATIN CALCIUM 20 MG PO TABS
ORAL_TABLET | Freq: Every day | ORAL | 0 refills | Status: DC
  Filled 2022-01-22: qty 90, 90d supply, fill #0

## 2022-01-23 ENCOUNTER — Other Ambulatory Visit (HOSPITAL_COMMUNITY): Payer: Self-pay

## 2022-01-23 MED ORDER — VITAMIN D (ERGOCALCIFEROL) 1.25 MG (50000 UNIT) PO CAPS
ORAL_CAPSULE | ORAL | 0 refills | Status: DC
  Filled 2022-01-23: qty 12, 84d supply, fill #0

## 2022-01-24 ENCOUNTER — Other Ambulatory Visit: Payer: Self-pay | Admitting: Family

## 2022-01-24 DIAGNOSIS — E559 Vitamin D deficiency, unspecified: Secondary | ICD-10-CM

## 2022-01-30 ENCOUNTER — Other Ambulatory Visit (HOSPITAL_COMMUNITY): Payer: Self-pay

## 2022-02-11 ENCOUNTER — Other Ambulatory Visit (HOSPITAL_COMMUNITY): Payer: Self-pay

## 2022-02-11 ENCOUNTER — Other Ambulatory Visit: Payer: Self-pay | Admitting: Family

## 2022-02-12 ENCOUNTER — Other Ambulatory Visit (HOSPITAL_COMMUNITY): Payer: Self-pay

## 2022-02-12 MED ORDER — PANTOPRAZOLE SODIUM 20 MG PO TBEC
DELAYED_RELEASE_TABLET | Freq: Every day | ORAL | 0 refills | Status: DC
  Filled 2022-02-12: qty 90, 90d supply, fill #0

## 2022-02-12 MED ORDER — DULOXETINE HCL 60 MG PO CPEP
60.0000 mg | ORAL_CAPSULE | Freq: Every day | ORAL | 0 refills | Status: DC
  Filled 2022-02-12: qty 90, 90d supply, fill #0

## 2022-02-16 ENCOUNTER — Other Ambulatory Visit: Payer: Self-pay | Admitting: Family

## 2022-02-16 DIAGNOSIS — E119 Type 2 diabetes mellitus without complications: Secondary | ICD-10-CM

## 2022-02-17 ENCOUNTER — Other Ambulatory Visit (HOSPITAL_COMMUNITY): Payer: Self-pay

## 2022-02-17 MED ORDER — METFORMIN HCL 1000 MG PO TABS
ORAL_TABLET | Freq: Two times a day (BID) | ORAL | 0 refills | Status: DC
  Filled 2022-02-17: qty 180, 90d supply, fill #0

## 2022-02-26 ENCOUNTER — Other Ambulatory Visit: Payer: Self-pay | Admitting: Family

## 2022-02-26 ENCOUNTER — Other Ambulatory Visit (HOSPITAL_COMMUNITY): Payer: Self-pay

## 2022-02-26 MED ORDER — EMPAGLIFLOZIN 10 MG PO TABS
10.0000 mg | ORAL_TABLET | Freq: Every day | ORAL | 0 refills | Status: DC
  Filled 2022-02-26: qty 30, 30d supply, fill #0

## 2022-03-27 ENCOUNTER — Other Ambulatory Visit (HOSPITAL_COMMUNITY): Payer: Self-pay

## 2022-03-27 ENCOUNTER — Other Ambulatory Visit: Payer: Self-pay | Admitting: Family

## 2022-03-27 MED ORDER — EMPAGLIFLOZIN 10 MG PO TABS
10.0000 mg | ORAL_TABLET | Freq: Every day | ORAL | 0 refills | Status: DC
  Filled 2022-03-27: qty 30, 30d supply, fill #0

## 2022-03-31 ENCOUNTER — Other Ambulatory Visit (HOSPITAL_COMMUNITY): Payer: Self-pay

## 2022-04-02 ENCOUNTER — Other Ambulatory Visit (HOSPITAL_COMMUNITY): Payer: Self-pay

## 2022-04-05 ENCOUNTER — Other Ambulatory Visit: Payer: Self-pay | Admitting: Family

## 2022-04-05 DIAGNOSIS — E039 Hypothyroidism, unspecified: Secondary | ICD-10-CM

## 2022-04-06 MED ORDER — LEVOTHYROXINE SODIUM 50 MCG PO TABS
50.0000 ug | ORAL_TABLET | Freq: Every day | ORAL | 1 refills | Status: DC
  Filled 2022-04-06: qty 90, fill #0
  Filled 2022-06-26: qty 90, 90d supply, fill #0
  Filled 2022-09-03 – 2022-09-24 (×2): qty 90, 90d supply, fill #1

## 2022-04-07 ENCOUNTER — Other Ambulatory Visit (HOSPITAL_COMMUNITY): Payer: Self-pay

## 2022-04-09 ENCOUNTER — Other Ambulatory Visit (HOSPITAL_COMMUNITY): Payer: Self-pay

## 2022-04-22 ENCOUNTER — Other Ambulatory Visit: Payer: Self-pay | Admitting: Family

## 2022-04-22 ENCOUNTER — Other Ambulatory Visit (HOSPITAL_COMMUNITY): Payer: Self-pay

## 2022-04-22 DIAGNOSIS — E559 Vitamin D deficiency, unspecified: Secondary | ICD-10-CM

## 2022-04-22 MED ORDER — EMPAGLIFLOZIN 10 MG PO TABS
10.0000 mg | ORAL_TABLET | Freq: Every day | ORAL | 0 refills | Status: DC
  Filled 2022-04-22: qty 30, 30d supply, fill #0

## 2022-04-22 MED ORDER — VITAMIN D (ERGOCALCIFEROL) 1.25 MG (50000 UNIT) PO CAPS
50000.0000 [IU] | ORAL_CAPSULE | ORAL | 0 refills | Status: DC
  Filled 2022-04-22: qty 4, 28d supply, fill #0

## 2022-04-23 ENCOUNTER — Encounter: Payer: Self-pay | Admitting: Pulmonary Disease

## 2022-04-23 ENCOUNTER — Ambulatory Visit (INDEPENDENT_AMBULATORY_CARE_PROVIDER_SITE_OTHER): Payer: No Typology Code available for payment source | Admitting: Pulmonary Disease

## 2022-04-23 VITALS — BP 130/70 | HR 70 | Ht 67.0 in | Wt 211.6 lb

## 2022-04-23 DIAGNOSIS — Z9989 Dependence on other enabling machines and devices: Secondary | ICD-10-CM

## 2022-04-23 DIAGNOSIS — G4733 Obstructive sleep apnea (adult) (pediatric): Secondary | ICD-10-CM | POA: Diagnosis not present

## 2022-04-23 NOTE — Patient Instructions (Signed)
Follow up in 1 year.

## 2022-04-23 NOTE — Progress Notes (Signed)
[facility]  Chief Complaint  Patient presents with   Follow-up    Follow-up     Constitutional:  BP 130/70 (BP Location: Left Arm)   Pulse 70   Ht 5\' 7"  (1.702 m)   Wt 211 lb 9.6 oz (96 kg)   SpO2 99%   BMI 33.14 kg/m   Past Medical History:  Hypothyroidism, HTN, HLD, DM  Past Surgical History:  Her  has a past surgical history that includes Shoulder surgery; Colonoscopy (11/17/2011); Anal Rectal manometry (N/A, 04/27/2019); and Abdominal hysterectomy.  Brief Summary:  Brenda Meyer is a 61 y.o. female with obstructive sleep apnea.      Subjective:   She is doing well with CPAP.  Has full face mask.  No issues with mask fit.  Has to use benadryl 25 mg at night to help with allergies.  She has tried flonase and astepro, but these don't help as much.  Not having dry mouth.  Physical Exam:   Appearance - well kempt   ENMT - no sinus tenderness, no oral exudate, no LAN, Mallampati 3 airway, no stridor  Respiratory - equal breath sounds bilaterally, no wheezing or rales  CV - s1s2 regular rate and rhythm, no murmurs  Ext - no clubbing, no edema  Skin - no rashes  Psych - normal mood and affect     Sleep Tests:  PSG 2012 >> AHI 29 Auto CPAP 01/22/22 to 04/21/22 >> used on 90 of 90 nights with average 8 hrs 10 min.  Average AHI 1.1 with median CPAP 9 and 95 th percentile CPAP 14 cm H2O  Social History:  She  reports that she quit smoking about 11 years ago. Her smoking use included cigarettes. She has a 30.00 pack-year smoking history. She has never used smokeless tobacco. She reports that she does not drink alcohol and does not use drugs.  Family History:  Her family history includes Brain cancer in her brother; Heart disease in her father and mother; Lung cancer in her sister.     Assessment/Plan:   Obstructive sleep apnea. - she is compliant with CPAP and reports benefit from therapy - she uses [facility] for her  DME - current CPAP ordered September 2022 - continue auto CPAP 5 to 15 cm H2O  Time Spent Involved in Patient Care on Day of Examination:  17 minutes  Follow up:   Patient Instructions  Follow up in 1 year  Medication List:   Allergies as of 04/23/2022       Reactions   Ace Inhibitors Swelling, Cough        Medication List        Accurate as of April 23, 2022  9:11 AM. If you have any questions, ask your nurse or doctor.          aspirin EC 81 MG tablet Take 1 tablet (81 mg total) by mouth daily.   atorvastatin 20 MG tablet Commonly known as: LIPITOR TAKE 1 TABLET BY MOUTH ONCE DAILY   CALCIUM + D PO Take 600 mg by mouth 2 (two) times daily.   cephALEXin 500 MG capsule Commonly known as: KEFLEX Take 1 capsule (500 mg total) by mouth 2 (two) times daily.   cyclobenzaprine 10 MG tablet Commonly known as: FLEXERIL Take 1 tablet (10 mg total) by mouth at bedtime.   diphenhydrAMINE 25 MG tablet Commonly known as: BENADRYL Take 25 mg by mouth at bedtime as needed for sleep.  docusate sodium 100 MG capsule Commonly known as: COLACE Take 100 mg by mouth 2 (two) times daily.   DULoxetine 60 MG capsule Commonly known as: CYMBALTA Take 1 capsule by mouth daily.   Jardiance 10 MG Tabs tablet Generic drug: empagliflozin Take 1 tablet by mouth daily. (NEEDS TO BE SEEN BEFORE NEXT REFILL)   levothyroxine 50 MCG tablet Commonly known as: SYNTHROID TAKE 1 TABLET BY MOUTH ONCE DAILY   losartan 50 MG tablet Commonly known as: COZAAR TAKE 1 TABLET BY MOUTH ONCE DAILY   metFORMIN 1000 MG tablet Commonly known as: GLUCOPHAGE TAKE 1 TABLET BY MOUTH 2 TIMES DAILY WITH A MEAL   pantoprazole 20 MG tablet Commonly known as: PROTONIX TAKE 1 TABLET BY MOUTH DAILY.   Vitamin D (Ergocalciferol) 1.25 MG (50000 UNIT) Caps capsule Commonly known as: DRISDOL Take 1 capsule by mouth every 7 days. (NEEDS TO BE SEEN BEFORE NEXT REFILL)        Signature:  Coralyn Helling, MD [facility] Pager - [phone] 04/23/2022, 9:11 AM

## 2022-04-23 NOTE — Addendum Note (Signed)
Addended by: Coralyn Helling on: 04/23/2022 09:12 AM   Modules accepted: Orders

## 2022-04-30 ENCOUNTER — Other Ambulatory Visit (HOSPITAL_COMMUNITY): Payer: Self-pay

## 2022-04-30 ENCOUNTER — Other Ambulatory Visit: Payer: Self-pay | Admitting: Family

## 2022-04-30 DIAGNOSIS — E785 Hyperlipidemia, unspecified: Secondary | ICD-10-CM

## 2022-04-30 DIAGNOSIS — E7800 Pure hypercholesterolemia, unspecified: Secondary | ICD-10-CM

## 2022-04-30 MED ORDER — ATORVASTATIN CALCIUM 20 MG PO TABS
ORAL_TABLET | Freq: Every day | ORAL | 0 refills | Status: DC
  Filled 2022-04-30: qty 90, 90d supply, fill #0

## 2022-05-14 ENCOUNTER — Other Ambulatory Visit: Payer: Self-pay | Admitting: Family

## 2022-05-14 ENCOUNTER — Other Ambulatory Visit (HOSPITAL_COMMUNITY): Payer: Self-pay

## 2022-05-14 DIAGNOSIS — E119 Type 2 diabetes mellitus without complications: Secondary | ICD-10-CM

## 2022-05-14 DIAGNOSIS — E559 Vitamin D deficiency, unspecified: Secondary | ICD-10-CM

## 2022-05-14 MED ORDER — VITAMIN D (ERGOCALCIFEROL) 1.25 MG (50000 UNIT) PO CAPS
50000.0000 [IU] | ORAL_CAPSULE | ORAL | 0 refills | Status: DC
  Filled 2022-05-14: qty 4, 28d supply, fill #0

## 2022-05-14 MED ORDER — METFORMIN HCL 1000 MG PO TABS
ORAL_TABLET | Freq: Two times a day (BID) | ORAL | 0 refills | Status: DC
  Filled 2022-05-14: qty 180, 90d supply, fill #0

## 2022-05-14 MED ORDER — EMPAGLIFLOZIN 10 MG PO TABS
10.0000 mg | ORAL_TABLET | Freq: Every day | ORAL | 0 refills | Status: DC
  Filled 2022-05-14: qty 30, 30d supply, fill #0

## 2022-05-15 ENCOUNTER — Other Ambulatory Visit (HOSPITAL_COMMUNITY): Payer: Self-pay

## 2022-05-15 MED ORDER — VITAMIN D (ERGOCALCIFEROL) 1.25 MG (50000 UNIT) PO CAPS
50000.0000 [IU] | ORAL_CAPSULE | ORAL | 0 refills | Status: DC
  Filled 2022-05-15 – 2022-06-11 (×2): qty 4, 28d supply, fill #0

## 2022-05-15 MED ORDER — EMPAGLIFLOZIN 10 MG PO TABS
10.0000 mg | ORAL_TABLET | Freq: Every day | ORAL | 0 refills | Status: DC
Start: 2022-05-15 — End: 2022-06-26
  Filled 2022-05-15 – 2022-05-16 (×2): qty 30, 30d supply, fill #0

## 2022-05-15 MED ORDER — METFORMIN HCL 1000 MG PO TABS
1000.0000 mg | ORAL_TABLET | Freq: Two times a day (BID) | ORAL | 0 refills | Status: DC
  Filled 2022-05-15: qty 180, fill #0
  Filled 2022-08-06: qty 180, 90d supply, fill #0

## 2022-05-16 ENCOUNTER — Other Ambulatory Visit (HOSPITAL_COMMUNITY): Payer: Self-pay

## 2022-05-21 ENCOUNTER — Encounter: Payer: Self-pay | Admitting: Family Medicine

## 2022-05-21 DIAGNOSIS — K589 Irritable bowel syndrome without diarrhea: Secondary | ICD-10-CM | POA: Insufficient documentation

## 2022-05-23 ENCOUNTER — Encounter: Payer: Self-pay | Admitting: Family

## 2022-05-23 ENCOUNTER — Ambulatory Visit (INDEPENDENT_AMBULATORY_CARE_PROVIDER_SITE_OTHER): Payer: No Typology Code available for payment source | Admitting: Family

## 2022-05-23 VITALS — BP 135/75 | HR 76 | Temp 97.3°F | Ht 67.0 in | Wt 214.8 lb

## 2022-05-23 DIAGNOSIS — Z0001 Encounter for general adult medical examination with abnormal findings: Secondary | ICD-10-CM

## 2022-05-23 DIAGNOSIS — Z0000 Encounter for general adult medical examination without abnormal findings: Secondary | ICD-10-CM

## 2022-05-23 DIAGNOSIS — I700 Atherosclerosis of aorta: Secondary | ICD-10-CM | POA: Diagnosis not present

## 2022-05-23 DIAGNOSIS — E669 Obesity, unspecified: Secondary | ICD-10-CM

## 2022-05-23 DIAGNOSIS — I10 Essential (primary) hypertension: Secondary | ICD-10-CM

## 2022-05-23 DIAGNOSIS — G4733 Obstructive sleep apnea (adult) (pediatric): Secondary | ICD-10-CM | POA: Diagnosis not present

## 2022-05-23 DIAGNOSIS — K5904 Chronic idiopathic constipation: Secondary | ICD-10-CM

## 2022-05-23 DIAGNOSIS — E039 Hypothyroidism, unspecified: Secondary | ICD-10-CM

## 2022-05-23 DIAGNOSIS — F411 Generalized anxiety disorder: Secondary | ICD-10-CM

## 2022-05-23 DIAGNOSIS — Z029 Encounter for administrative examinations, unspecified: Secondary | ICD-10-CM

## 2022-05-23 DIAGNOSIS — F320 Major depressive disorder, single episode, mild: Secondary | ICD-10-CM

## 2022-05-23 DIAGNOSIS — E1169 Type 2 diabetes mellitus with other specified complication: Secondary | ICD-10-CM

## 2022-05-23 LAB — BAYER DCA HB A1C WAIVED: HB A1C (BAYER DCA - WAIVED): 7.2 % — ABNORMAL HIGH (ref 4.8–5.6)

## 2022-05-23 NOTE — Patient Instructions (Signed)

## 2022-05-23 NOTE — Progress Notes (Signed)
Subjective:    Patient ID: Brenda Meyer, female    DOB: 1960-10-17, 61 y.o.   MRN: [id number]  Chief Complaint  Patient presents with   Medical Management of Chronic Issues   Cellulitis    From bug bite and teeth pulled wbc will be high per patient  going to pharmacy for flu and covid today    Pt presents to the office today for CPE and  chronic follow up.  Pt is followed by Pulmonologist annually for CPAP. Followed by GYN annually for pap and mammogram.   Has atherosclerosis of aorta and takes lipitor.  Hypertension This is a chronic problem. The current episode started more than 1 year ago. The problem has been waxing and waning since onset. The problem is uncontrolled. Associated symptoms include anxiety and malaise/fatigue. Pertinent negatives include no blurred vision, peripheral edema or shortness of breath. Risk factors for coronary artery disease include dyslipidemia, obesity and sedentary lifestyle. The current treatment provides moderate improvement. Identifiable causes of hypertension include a thyroid problem.  Thyroid Problem Presents for follow-up visit. Symptoms include anxiety, constipation, depressed mood and fatigue. Her past medical history is significant for hyperlipidemia.  Diabetes She presents for her follow-up diabetic visit. She has type 2 diabetes mellitus. Hypoglycemia symptoms include nervousness/anxiousness. Associated symptoms include fatigue. Pertinent negatives for diabetes include no blurred vision and no foot paresthesias. Symptoms are stable. Diabetic complications include heart disease. Pertinent negatives for diabetic complications include no peripheral neuropathy. Risk factors for coronary artery disease include dyslipidemia, diabetes mellitus, hypertension and sedentary lifestyle. She is following a generally healthy diet. Her overall blood glucose range is 130-140 mg/dl. Eye exam is current.  Hyperlipidemia This is a chronic problem. The current episode  started more than 1 year ago. The problem is controlled. Recent lipid tests were reviewed and are normal. Exacerbating diseases include obesity. Pertinent negatives include no shortness of breath. Current antihyperlipidemic treatment includes statins. The current treatment provides moderate improvement of lipids. Risk factors for coronary artery disease include dyslipidemia, diabetes mellitus, hypertension, a sedentary lifestyle and post-menopausal.  Anxiety Presents for follow-up visit. Symptoms include depressed mood, excessive worry, irritability and nervous/anxious behavior. Patient reports no restlessness or shortness of breath. Symptoms occur occasionally. The severity of symptoms is mild.    Depression        This is a chronic problem.  The current episode started more than 1 year ago.   The onset quality is sudden.   The problem occurs intermittently.  Associated symptoms include fatigue, hopelessness and sad.  Associated symptoms include no helplessness and no restlessness.  Past medical history includes thyroid problem and anxiety.   Constipation This is a chronic problem. The current episode started more than 1 year ago. The problem has been waxing and waning since onset. Her stool frequency is 2 to 3 times per week. Risk factors include obesity. She has tried laxatives for the symptoms. The treatment provided moderate relief.      Review of Systems  Constitutional:  Positive for fatigue, irritability and malaise/fatigue.  Eyes:  Negative for blurred vision.  Respiratory:  Negative for shortness of breath.   Gastrointestinal:  Positive for constipation.  Psychiatric/Behavioral:  Positive for depression. The patient is nervous/anxious.   All other systems reviewed and are negative.  Family History  Problem Relation Age of Onset   Heart disease Mother    Heart disease Father    Brain cancer Brother    Lung cancer Sister    Social History  Socioeconomic History   Marital  status: Married    Spouse name: Not on file   Number of children: Not on file   Years of education: Not on file   Highest education level: Not on file  Occupational History   Occupation: nurse    Employer: [employer]  Tobacco Use   Smoking status: Former    Packs/day: 1.00    Years: 30.00    Total pack years: 30.00    Types: Cigarettes    Quit date: 10/02/2010    Years since quitting: 11.6   Smokeless tobacco: Never  Vaping Use   Vaping Use: Never used  Substance and Sexual Activity   Alcohol use: No   Drug use: Never   Sexual activity: Not on file  Other Topics Concern   Not on file  Social History Narrative   Not on file   Social Determinants of Health   Financial Resource Strain: Not on file  Food Insecurity: Not on file  Transportation Needs: Not on file  Physical Activity: Not on file  Stress: Not on file  Social Connections: Not on file       Objective:   Physical Exam Vitals reviewed.  Constitutional:      General: She is not in acute distress.    Appearance: She is well-developed. She is obese.  HENT:     Head: Normocephalic and atraumatic.     Right Ear: Tympanic membrane normal.     Left Ear: Tympanic membrane normal.  Eyes:     Pupils: Pupils are equal, round, and reactive to light.  Neck:     Thyroid: No thyromegaly.  Cardiovascular:     Rate and Rhythm: Normal rate and regular rhythm.     Heart sounds: Normal heart sounds. No murmur heard. Pulmonary:     Effort: Pulmonary effort is normal. No respiratory distress.     Breath sounds: Normal breath sounds. No wheezing.  Abdominal:     General: Bowel sounds are normal. There is no distension.     Palpations: Abdomen is soft.     Tenderness: There is no abdominal tenderness.  Musculoskeletal:        General: No tenderness. Normal range of motion.     Cervical back: Normal range of motion and neck supple.  Skin:    General: Skin is warm and dry.  Neurological:     Mental Status: She is alert  and oriented to person, place, and time.     Cranial Nerves: No cranial nerve deficit.     Deep Tendon Reflexes: Reflexes are normal and symmetric.  Psychiatric:        Behavior: Behavior normal.        Thought Content: Thought content normal.        Judgment: Judgment normal.       BP (!) 153/66   Pulse 76   Temp (!) 97.3 F (36.3 C) (Temporal)   Ht 5' 7"  (1.702 m)   Wt 214 lb 12.8 oz (97.4 kg)   SpO2 100%   BMI 33.64 kg/m      Assessment & Plan:  Brenda Meyer comes in today with chief complaint of Medical Management of Chronic Issues and Cellulitis (From bug bite and teeth pulled wbc will be high per patient  going to pharmacy for flu and covid today )   Diagnosis and orders addressed:  1. Annual physical exam - CMP14+EGFR - CBC with Differential/Platelet - Lipid panel - Microalbumin / creatinine urine  ratio - TSH - Bayer DCA Hb A1c Waived  2. Atherosclerosis of aorta (HCC) - CMP14+EGFR - CBC with Differential/Platelet  3. Essential hypertension - CMP14+EGFR - CBC with Differential/Platelet  4. OSA (obstructive sleep apnea) - CMP14+EGFR - CBC with Differential/Platelet  5. Hypothyroidism, unspecified type - CMP14+EGFR - CBC with Differential/Platelet  6. Type 2 diabetes mellitus with other specified complication, without long-term current use of insulin (HCC) - CMP14+EGFR - CBC with Differential/Platelet - Microalbumin / creatinine urine ratio - Bayer DCA Hb A1c Waived  7. Obesity (BMI 30-39.9) - CMP14+EGFR - CBC with Differential/Platelet  8. Depression, major, single episode, mild (HCC) - CMP14+EGFR - CBC with Differential/Platelet  9. Chronic idiopathic constipation - CMP14+EGFR - CBC with Differential/Platelet  10. GAD (generalized anxiety disorder) - CMP14+EGFR - CBC with Differential/Platelet   Labs pending Health Maintenance reviewed Diet and exercise encouraged  Follow up plan: 6 months    Evelina Dun, FNP

## 2022-05-24 LAB — LIPID PANEL
Chol/HDL Ratio: 2.6 ratio (ref 0.0–4.4)
Cholesterol, Total: 176 mg/dL (ref 100–199)
HDL: 67 mg/dL (ref >39)
LDL Chol Calc (NIH): 83 mg/dL (ref 0–99)
Triglycerides: 152 mg/dL — ABNORMAL HIGH (ref 0–149)
VLDL Cholesterol Cal: 26 mg/dL (ref 5–40)

## 2022-05-24 LAB — CMP14+EGFR
ALT: 9 IU/L (ref 0–32)
AST: 10 IU/L (ref 0–40)
Albumin/Globulin Ratio: 1.7 (ref 1.2–2.2)
Albumin: 4.2 g/dL (ref 3.9–4.9)
Alkaline Phosphatase: 143 IU/L — ABNORMAL HIGH (ref 44–121)
BUN/Creatinine Ratio: 9 — ABNORMAL LOW (ref 12–28)
BUN: 7 mg/dL — ABNORMAL LOW (ref 8–27)
Bilirubin Total: 0.2 mg/dL (ref 0.0–1.2)
CO2: 21 mmol/L (ref 20–29)
Calcium: 9.8 mg/dL (ref 8.7–10.3)
Chloride: 101 mmol/L (ref 96–106)
Creatinine, Ser: 0.81 mg/dL (ref 0.57–1.00)
Globulin, Total: 2.5 g/dL (ref 1.5–4.5)
Glucose: 113 mg/dL — ABNORMAL HIGH (ref 70–99)
Potassium: 4.4 mmol/L (ref 3.5–5.2)
Sodium: 139 mmol/L (ref 134–144)
Total Protein: 6.7 g/dL (ref 6.0–8.5)
eGFR: 82 mL/min/{1.73_m2} (ref >59)

## 2022-05-24 LAB — MICROALBUMIN / CREATININE URINE RATIO
Creatinine, Urine: 9.2 mg/dL
Microalbumin, Urine: <3 ug/mL

## 2022-05-24 LAB — CBC WITH DIFFERENTIAL/PLATELET
Basophils Absolute: 0.1 10*3/uL (ref 0.0–0.2)
Basos: 1 %
EOS (ABSOLUTE): 0.1 10*3/uL (ref 0.0–0.4)
Eos: 1 %
Hematocrit: 34.4 % (ref 34.0–46.6)
Hemoglobin: 10.5 g/dL — ABNORMAL LOW (ref 11.1–15.9)
Immature Grans (Abs): 0.1 10*3/uL (ref 0.0–0.1)
Immature Granulocytes: 1 %
Lymphocytes Absolute: 1.9 10*3/uL (ref 0.7–3.1)
Lymphs: 20 %
MCH: 24.6 pg — ABNORMAL LOW (ref 26.6–33.0)
MCHC: 30.5 g/dL — ABNORMAL LOW (ref 31.5–35.7)
MCV: 81 fL (ref 79–97)
Monocytes Absolute: 0.6 10*3/uL (ref 0.1–0.9)
Monocytes: 6 %
Neutrophils Absolute: 6.7 10*3/uL (ref 1.4–7.0)
Neutrophils: 71 %
Platelets: 315 10*3/uL (ref 150–450)
RBC: 4.26 x10E6/uL (ref 3.77–5.28)
RDW: 15.3 % (ref 11.7–15.4)
WBC: 9.4 10*3/uL (ref 3.4–10.8)

## 2022-05-24 LAB — TSH: TSH: 4.69 u[IU]/mL — ABNORMAL HIGH (ref 0.450–4.500)

## 2022-05-29 ENCOUNTER — Other Ambulatory Visit (HOSPITAL_COMMUNITY): Payer: Self-pay

## 2022-05-29 ENCOUNTER — Other Ambulatory Visit: Payer: Self-pay | Admitting: Family

## 2022-05-29 DIAGNOSIS — G8929 Other chronic pain: Secondary | ICD-10-CM

## 2022-05-29 MED ORDER — CYCLOBENZAPRINE HCL 10 MG PO TABS
10.0000 mg | ORAL_TABLET | Freq: Every day | ORAL | 2 refills | Status: DC
  Filled 2022-05-29 – 2022-06-26 (×2): qty 90, 90d supply, fill #0
  Filled 2022-09-20: qty 90, 90d supply, fill #1

## 2022-05-29 MED ORDER — DULOXETINE HCL 60 MG PO CPEP
60.0000 mg | ORAL_CAPSULE | Freq: Every day | ORAL | 1 refills | Status: DC
  Filled 2022-05-29: qty 90, 90d supply, fill #0
  Filled 2022-09-03: qty 90, 90d supply, fill #1

## 2022-05-29 MED ORDER — PANTOPRAZOLE SODIUM 20 MG PO TBEC
20.0000 mg | DELAYED_RELEASE_TABLET | Freq: Every day | ORAL | 1 refills | Status: DC
  Filled 2022-05-29: qty 90, 90d supply, fill #0
  Filled 2022-09-20: qty 90, 90d supply, fill #1

## 2022-06-11 ENCOUNTER — Other Ambulatory Visit (HOSPITAL_COMMUNITY): Payer: Self-pay

## 2022-06-26 ENCOUNTER — Other Ambulatory Visit (HOSPITAL_COMMUNITY): Payer: Self-pay

## 2022-06-26 ENCOUNTER — Other Ambulatory Visit: Payer: Self-pay | Admitting: Family

## 2022-06-26 MED ORDER — EMPAGLIFLOZIN 10 MG PO TABS
10.0000 mg | ORAL_TABLET | Freq: Every day | ORAL | 1 refills | Status: DC
  Filled 2022-06-26: qty 90, 90d supply, fill #0
  Filled 2022-09-20: qty 90, 90d supply, fill #1

## 2022-07-03 ENCOUNTER — Encounter: Payer: Self-pay | Admitting: Family Medicine

## 2022-07-03 ENCOUNTER — Ambulatory Visit (INDEPENDENT_AMBULATORY_CARE_PROVIDER_SITE_OTHER): Payer: No Typology Code available for payment source | Admitting: Family Medicine

## 2022-07-03 DIAGNOSIS — U071 COVID-19: Secondary | ICD-10-CM | POA: Diagnosis not present

## 2022-07-03 MED ORDER — ALBUTEROL SULFATE HFA 108 (90 BASE) MCG/ACT IN AERS: 2.0000 | INHALATION_SPRAY | Freq: Four times a day (QID) | RESPIRATORY_TRACT | 2 refills | Status: DC | PRN

## 2022-07-03 MED ORDER — NIRMATRELVIR/RITONAVIR (PAXLOVID)TABLET: 3.0000 | ORAL_TABLET | Freq: Two times a day (BID) | ORAL | 0 refills | Status: AC

## 2022-07-03 MED ORDER — PREDNISONE 20 MG PO TABS: 40.0000 mg | ORAL_TABLET | Freq: Every day | ORAL | 0 refills | Status: AC

## 2022-07-03 NOTE — Progress Notes (Signed)
Virtual Visit  Note Due to COVID-19 pandemic this visit was conducted virtually. This visit type was conducted due to national recommendations for restrictions regarding the COVID-19 Pandemic (e.g. social distancing, sheltering in place) in an effort to limit this patient's exposure and mitigate transmission in our community. All issues noted in this document were discussed and addressed.  A physical exam was not performed with this format.  I connected with Brenda Meyer on 07/03/22 at 1236 by telephone and verified that I am speaking with the correct person using two identifiers. Brenda Meyer is currently located at home and her husband is currently with her during the visit. The provider, Gabriel Earing, FNP is located in their office at time of visit.  I discussed the limitations, risks, security and privacy concerns of performing an evaluation and management service by telephone and the availability of in person appointments. I also discussed with the patient that there may be a patient responsible charge related to this service. The patient expressed understanding and agreed to proceed.   History and Present Illness:  URI  This is a new problem. The current episode started yesterday. There has been no fever. Associated symptoms include congestion, diarrhea and sinus pain. Pertinent negatives include no abdominal pain, chest pain, coughing, headaches, sore throat, vomiting or wheezing. Associated symptoms comments: Mild shortness of breath with activity. She has tried nothing for the symptoms.  She tested positive for Covid yesterday. Former smoker    Review of Systems  HENT:  Positive for congestion and sinus pain. Negative for sore throat.   Respiratory:  Negative for cough and wheezing.   Cardiovascular:  Negative for chest pain.  Gastrointestinal:  Positive for diarrhea. Negative for abdominal pain and vomiting.  Neurological:  Negative for headaches.      Observations/Objective: Alert and oriented x 3. Able to speak in full sentences without difficulty.   Assessment and Plan: Vaanya was seen today for covid positive.  Diagnoses and all orders for this visit:  COVID-19 Hx of DM and HTN. Paxlovid discussed and ordered. Instructed to hold statin while taking paxlovid and for 3 days after. Short prednisone burst and albuterol prn for shortness of breath. Discussed symptomatic care and return precautions. Aware of quarantine recommendations.  -     nirmatrelvir/ritonavir EUA (PAXLOVID) 20 x 150 MG & 10 x 100MG  TABS; Take 3 tablets by mouth 2 (two) times daily for 5 days. (Take nirmatrelvir 150 mg two tablets twice daily for 5 days and ritonavir 100 mg one tablet twice daily for 5 days) Patient GFR is 82 -     predniSONE (DELTASONE) 20 MG tablet; Take 2 tablets (40 mg total) by mouth daily with breakfast for 3 days. -     albuterol (VENTOLIN HFA) 108 (90 Base) MCG/ACT inhaler; Inhale 2 puffs into the lungs every 6 (six) hours as needed for wheezing or shortness of breath.     Follow Up Instructions: As needed.     I discussed the assessment and treatment plan with the patient. The patient was provided an opportunity to ask questions and all were answered. The patient agreed with the plan and demonstrated an understanding of the instructions.   The patient was advised to call back or seek an in-person evaluation if the symptoms worsen or if the condition fails to improve as anticipated.  The above assessment and management plan was discussed with the patient. The patient verbalized understanding of and has agreed to the management plan. Patient  is aware to call the clinic if symptoms persist or worsen. Patient is aware when to return to the clinic for a follow-up visit. Patient educated on when it is appropriate to go to the emergency department.   Time call ended:  1148  I provided 12 minutes of  non face-to-face time during this  encounter.    Gwenlyn Perking, FNP

## 2022-07-20 ENCOUNTER — Other Ambulatory Visit: Payer: Self-pay | Admitting: Family

## 2022-07-20 DIAGNOSIS — E559 Vitamin D deficiency, unspecified: Secondary | ICD-10-CM

## 2022-07-21 ENCOUNTER — Other Ambulatory Visit (HOSPITAL_COMMUNITY): Payer: Self-pay

## 2022-07-21 MED ORDER — VITAMIN D (ERGOCALCIFEROL) 1.25 MG (50000 UNIT) PO CAPS
50000.0000 [IU] | ORAL_CAPSULE | ORAL | 1 refills | Status: DC
  Filled 2022-07-21: qty 12, 84d supply, fill #0
  Filled 2022-10-08: qty 12, 84d supply, fill #1

## 2022-08-06 ENCOUNTER — Other Ambulatory Visit: Payer: Self-pay | Admitting: Family

## 2022-08-06 ENCOUNTER — Other Ambulatory Visit (HOSPITAL_COMMUNITY): Payer: Self-pay

## 2022-08-06 DIAGNOSIS — I10 Essential (primary) hypertension: Secondary | ICD-10-CM

## 2022-08-06 DIAGNOSIS — E785 Hyperlipidemia, unspecified: Secondary | ICD-10-CM

## 2022-08-06 DIAGNOSIS — E7800 Pure hypercholesterolemia, unspecified: Secondary | ICD-10-CM

## 2022-08-06 MED ORDER — ATORVASTATIN CALCIUM 20 MG PO TABS
20.0000 mg | ORAL_TABLET | Freq: Every day | ORAL | 0 refills | Status: DC
  Filled 2022-08-06: qty 90, 90d supply, fill #0

## 2022-08-06 MED ORDER — LOSARTAN POTASSIUM 50 MG PO TABS
50.0000 mg | ORAL_TABLET | Freq: Every day | ORAL | 0 refills | Status: DC
  Filled 2022-08-06: qty 90, 90d supply, fill #0

## 2022-08-15 LAB — HM DIABETES EYE EXAM

## 2022-09-03 ENCOUNTER — Other Ambulatory Visit: Payer: Self-pay

## 2022-09-03 ENCOUNTER — Other Ambulatory Visit (HOSPITAL_COMMUNITY): Payer: Self-pay

## 2022-09-04 ENCOUNTER — Other Ambulatory Visit (HOSPITAL_COMMUNITY): Payer: Self-pay

## 2022-09-05 ENCOUNTER — Other Ambulatory Visit: Payer: Self-pay

## 2022-09-11 ENCOUNTER — Other Ambulatory Visit: Payer: Self-pay

## 2022-09-11 ENCOUNTER — Other Ambulatory Visit (HOSPITAL_COMMUNITY): Payer: Self-pay

## 2022-09-11 MED ORDER — MELOXICAM 15 MG PO TABS
15.0000 mg | ORAL_TABLET | Freq: Every day | ORAL | 1 refills | Status: DC
  Filled 2022-09-11: qty 90, 90d supply, fill #0
  Filled 2022-12-06: qty 90, 90d supply, fill #1

## 2022-09-20 ENCOUNTER — Other Ambulatory Visit (HOSPITAL_COMMUNITY): Payer: Self-pay

## 2022-10-08 ENCOUNTER — Other Ambulatory Visit (HOSPITAL_COMMUNITY): Payer: Self-pay

## 2022-10-28 ENCOUNTER — Other Ambulatory Visit: Payer: Self-pay

## 2022-10-28 ENCOUNTER — Other Ambulatory Visit (HOSPITAL_COMMUNITY): Payer: Self-pay

## 2022-10-28 ENCOUNTER — Other Ambulatory Visit: Payer: Self-pay | Admitting: Family

## 2022-10-28 DIAGNOSIS — E785 Hyperlipidemia, unspecified: Secondary | ICD-10-CM

## 2022-10-28 DIAGNOSIS — E119 Type 2 diabetes mellitus without complications: Secondary | ICD-10-CM

## 2022-10-28 DIAGNOSIS — E7800 Pure hypercholesterolemia, unspecified: Secondary | ICD-10-CM

## 2022-10-28 DIAGNOSIS — I10 Essential (primary) hypertension: Secondary | ICD-10-CM

## 2022-10-28 MED ORDER — ATORVASTATIN CALCIUM 20 MG PO TABS
20.0000 mg | ORAL_TABLET | Freq: Every day | ORAL | 0 refills | Status: DC
  Filled 2022-10-28: qty 30, 30d supply, fill #0

## 2022-10-28 MED ORDER — METFORMIN HCL 1000 MG PO TABS
1000.0000 mg | ORAL_TABLET | Freq: Two times a day (BID) | ORAL | 0 refills | Status: DC
  Filled 2022-10-28: qty 60, 30d supply, fill #0

## 2022-10-28 MED ORDER — LOSARTAN POTASSIUM 50 MG PO TABS
50.0000 mg | ORAL_TABLET | Freq: Every day | ORAL | 0 refills | Status: DC
  Filled 2022-10-28: qty 30, 30d supply, fill #0

## 2022-11-20 DIAGNOSIS — G4733 Obstructive sleep apnea (adult) (pediatric): Secondary | ICD-10-CM | POA: Diagnosis not present

## 2022-11-21 ENCOUNTER — Encounter: Payer: Self-pay | Admitting: Family

## 2022-11-21 ENCOUNTER — Other Ambulatory Visit (HOSPITAL_COMMUNITY): Payer: Self-pay

## 2022-11-21 ENCOUNTER — Ambulatory Visit (INDEPENDENT_AMBULATORY_CARE_PROVIDER_SITE_OTHER): Payer: 59 | Admitting: Family

## 2022-11-21 ENCOUNTER — Other Ambulatory Visit: Payer: Self-pay

## 2022-11-21 VITALS — BP 132/75 | HR 65 | Temp 97.6°F | Ht 67.0 in | Wt 210.2 lb

## 2022-11-21 DIAGNOSIS — E7800 Pure hypercholesterolemia, unspecified: Secondary | ICD-10-CM

## 2022-11-21 DIAGNOSIS — Z122 Encounter for screening for malignant neoplasm of respiratory organs: Secondary | ICD-10-CM

## 2022-11-21 DIAGNOSIS — E785 Hyperlipidemia, unspecified: Secondary | ICD-10-CM | POA: Diagnosis not present

## 2022-11-21 DIAGNOSIS — Z0001 Encounter for general adult medical examination with abnormal findings: Secondary | ICD-10-CM

## 2022-11-21 DIAGNOSIS — I10 Essential (primary) hypertension: Secondary | ICD-10-CM

## 2022-11-21 DIAGNOSIS — F411 Generalized anxiety disorder: Secondary | ICD-10-CM

## 2022-11-21 DIAGNOSIS — G4733 Obstructive sleep apnea (adult) (pediatric): Secondary | ICD-10-CM

## 2022-11-21 DIAGNOSIS — E039 Hypothyroidism, unspecified: Secondary | ICD-10-CM

## 2022-11-21 DIAGNOSIS — F320 Major depressive disorder, single episode, mild: Secondary | ICD-10-CM

## 2022-11-21 DIAGNOSIS — E669 Obesity, unspecified: Secondary | ICD-10-CM | POA: Diagnosis not present

## 2022-11-21 DIAGNOSIS — M546 Pain in thoracic spine: Secondary | ICD-10-CM

## 2022-11-21 DIAGNOSIS — G8929 Other chronic pain: Secondary | ICD-10-CM

## 2022-11-21 DIAGNOSIS — R879 Unspecified abnormal finding in specimens from female genital organs: Secondary | ICD-10-CM | POA: Diagnosis not present

## 2022-11-21 DIAGNOSIS — Z0000 Encounter for general adult medical examination without abnormal findings: Secondary | ICD-10-CM

## 2022-11-21 DIAGNOSIS — E119 Type 2 diabetes mellitus without complications: Secondary | ICD-10-CM

## 2022-11-21 DIAGNOSIS — E559 Vitamin D deficiency, unspecified: Secondary | ICD-10-CM

## 2022-11-21 DIAGNOSIS — K5904 Chronic idiopathic constipation: Secondary | ICD-10-CM

## 2022-11-21 LAB — BAYER DCA HB A1C WAIVED: HB A1C (BAYER DCA - WAIVED): 6.9 % — ABNORMAL HIGH (ref 4.8–5.6)

## 2022-11-21 LAB — LIPID PANEL

## 2022-11-21 MED ORDER — ATORVASTATIN CALCIUM 20 MG PO TABS
20.0000 mg | ORAL_TABLET | Freq: Every day | ORAL | 0 refills | Status: DC
  Filled 2022-11-21: qty 30, 30d supply, fill #0

## 2022-11-21 MED ORDER — LEVOTHYROXINE SODIUM 50 MCG PO TABS
50.0000 ug | ORAL_TABLET | Freq: Every day | ORAL | 1 refills | Status: DC
  Filled 2022-11-21 – 2022-12-17 (×2): qty 90, 90d supply, fill #0
  Filled 2023-03-19: qty 90, 90d supply, fill #1

## 2022-11-21 MED ORDER — CYCLOBENZAPRINE HCL 10 MG PO TABS
10.0000 mg | ORAL_TABLET | Freq: Every day | ORAL | 2 refills | Status: DC
  Filled 2022-11-21 – 2022-12-17 (×2): qty 90, 90d supply, fill #0
  Filled 2023-03-18: qty 90, 90d supply, fill #1
  Filled 2023-03-19 – 2023-06-10 (×2): qty 90, 90d supply, fill #2

## 2022-11-21 MED ORDER — PANTOPRAZOLE SODIUM 20 MG PO TBEC
20.0000 mg | DELAYED_RELEASE_TABLET | Freq: Every day | ORAL | 1 refills | Status: DC
  Filled 2022-11-21 – 2022-12-17 (×2): qty 90, 90d supply, fill #0
  Filled 2023-03-18: qty 90, 90d supply, fill #1

## 2022-11-21 MED ORDER — DULOXETINE HCL 60 MG PO CPEP
60.0000 mg | ORAL_CAPSULE | Freq: Every day | ORAL | 1 refills | Status: DC
  Filled 2022-11-21: qty 90, 90d supply, fill #0
  Filled 2023-02-21: qty 90, 90d supply, fill #1

## 2022-11-21 MED ORDER — EMPAGLIFLOZIN 10 MG PO TABS
10.0000 mg | ORAL_TABLET | Freq: Every day | ORAL | 1 refills | Status: DC
  Filled 2022-11-21 – 2022-12-17 (×2): qty 90, 90d supply, fill #0
  Filled 2023-02-21 – 2023-03-18 (×2): qty 90, 90d supply, fill #1

## 2022-11-21 MED ORDER — LOSARTAN POTASSIUM 50 MG PO TABS
50.0000 mg | ORAL_TABLET | Freq: Every day | ORAL | 0 refills | Status: DC
  Filled 2022-11-21: qty 30, 30d supply, fill #0

## 2022-11-21 MED ORDER — METFORMIN HCL 1000 MG PO TABS
1000.0000 mg | ORAL_TABLET | Freq: Two times a day (BID) | ORAL | 0 refills | Status: DC
  Filled 2022-11-21: qty 60, 30d supply, fill #0

## 2022-11-21 NOTE — Progress Notes (Signed)
Subjective:    Patient ID: Brenda Meyer, female    DOB: [birth year], 62 y.o.   MRN: [id number]  Chief Complaint  Patient presents with   Annual Exam   Pt presents to the office today for CPE and  chronic follow up.  Pt is followed by Pulmonologist annually for CPAP. Followed by GYN annually for pap and mammogram.    Has atherosclerosis of aorta and takes lipitor.  Hypertension This is a chronic problem. The current episode started more than 1 year ago. The problem has been resolved since onset. The problem is controlled. Associated symptoms include anxiety and malaise/fatigue. Pertinent negatives include no blurred vision, peripheral edema or shortness of breath. Risk factors for coronary artery disease include obesity, dyslipidemia and sedentary lifestyle. The current treatment provides moderate improvement. Identifiable causes of hypertension include a thyroid problem.  Thyroid Problem Presents for follow-up visit. Symptoms include constipation and fatigue. Patient reports no anxiety, diarrhea or dry skin. The symptoms have been stable. Her past medical history is significant for hyperlipidemia.  Diabetes She presents for her follow-up diabetic visit. She has type 2 diabetes mellitus. Pertinent negatives for hypoglycemia include no nervousness/anxiousness. Associated symptoms include fatigue. Pertinent negatives for diabetes include no blurred vision and no foot paresthesias. Symptoms are stable. Pertinent negatives for diabetic complications include no peripheral neuropathy. Risk factors for coronary artery disease include dyslipidemia, diabetes mellitus, hypertension, sedentary lifestyle and post-menopausal. She is following a generally healthy diet. Her overall blood glucose range is 130-140 mg/dl.  Hyperlipidemia This is a chronic problem. The current episode started more than 1 year ago. The problem is controlled. Pertinent negatives include no shortness of breath. Current  antihyperlipidemic treatment includes diet change and statins. The current treatment provides moderate improvement of lipids.  Anxiety Presents for follow-up visit. Symptoms include excessive worry and restlessness. Patient reports no nervous/anxious behavior or shortness of breath. Symptoms occur occasionally. The severity of symptoms is mild.    Depression        This is a chronic problem.  The current episode started more than 1 year ago.   Associated symptoms include fatigue and restlessness.  Associated symptoms include no helplessness, no hopelessness and not sad.  Past treatments include SNRIs - Serotonin and norepinephrine reuptake inhibitors.  Past medical history includes thyroid problem and anxiety.   Back Pain This is a chronic problem. The current episode started more than 1 year ago. The problem occurs intermittently. The problem has been waxing and waning since onset. The pain is present in the lumbar spine. The quality of the pain is described as aching. The pain is at a severity of 2/10. The pain is mild. She has tried home exercises for the symptoms. The treatment provided mild relief.      Review of Systems  Constitutional:  Positive for fatigue and malaise/fatigue.  Eyes:  Negative for blurred vision.  Respiratory:  Negative for shortness of breath.   Gastrointestinal:  Positive for constipation. Negative for diarrhea.  Musculoskeletal:  Positive for back pain.  Psychiatric/Behavioral:  Positive for depression. The patient is not nervous/anxious.   All other systems reviewed and are negative.  Family History  Problem Relation Age of Onset   Heart disease Mother    Heart disease Father    Brain cancer Brother    Lung cancer Sister    Social History   Socioeconomic History   Marital status: Married    Spouse name: Not on file   Number of children: Not on  file   Years of education: Not on file   Highest education level: Not on file  Occupational History    Occupation: nurse    Employer: [employer]  Tobacco Use   Smoking status: Former    Packs/day: 1.00    Years: 30.00    Additional pack years: 0.00    Total pack years: 30.00    Types: Cigarettes    Quit date: 10/02/2010    Years since quitting: 12.1   Smokeless tobacco: Never  Vaping Use   Vaping Use: Never used  Substance and Sexual Activity   Alcohol use: No   Drug use: Never   Sexual activity: Not on file  Other Topics Concern   Not on file  Social History Narrative   Not on file   Social Determinants of Health   Financial Resource Strain: Not on file  Food Insecurity: Not on file  Transportation Needs: Not on file  Physical Activity: Not on file  Stress: Not on file  Social Connections: Not on file        Objective:   Physical Exam Vitals reviewed.  Constitutional:      General: She is not in acute distress.    Appearance: She is well-developed.  HENT:     Head: Normocephalic and atraumatic.     Right Ear: Tympanic membrane normal.     Left Ear: Tympanic membrane normal.  Eyes:     Pupils: Pupils are equal, round, and reactive to light.  Neck:     Thyroid: No thyromegaly.  Cardiovascular:     Rate and Rhythm: Normal rate and regular rhythm.     Heart sounds: Normal heart sounds. No murmur heard. Pulmonary:     Effort: Pulmonary effort is normal. No respiratory distress.     Breath sounds: Normal breath sounds. No wheezing.  Abdominal:     General: Bowel sounds are normal. There is no distension.     Palpations: Abdomen is soft.     Tenderness: There is no abdominal tenderness.  Musculoskeletal:        General: No tenderness. Normal range of motion.     Cervical back: Normal range of motion and neck supple.  Skin:    General: Skin is warm and dry.  Neurological:     Mental Status: She is alert and oriented to person, place, and time.     Cranial Nerves: No cranial nerve deficit.     Deep Tendon Reflexes: Reflexes are normal and symmetric.   Psychiatric:        Behavior: Behavior normal.        Thought Content: Thought content normal.        Judgment: Judgment normal.          BP 132/75   Pulse 65   Temp 97.6 F (36.4 C) (Temporal)   Ht 5\' 7"  (1.702 m)   Wt 210 lb 3.2 oz (95.3 kg)   SpO2 98%   BMI 32.92 kg/m   Assessment & Plan:  Brenda Meyer comes in today with chief complaint of Annual Exam   Diagnosis and orders addressed:  1. Annual physical exam - Bayer DCA Hb A1c Waived - CBC with Differential/Platelet - CMP14+EGFR - Lipid panel - TSH - VITAMIN D 25 Hydroxy (Vit-D Deficiency, Fractures)  2. Pure hypercholesterolemia - atorvastatin (LIPITOR) 20 MG tablet; Take 1 tablet (20 mg total) by mouth daily. (NEEDS TO BE SEEN BEFORE NEXT REFILL)  Dispense: 30 tablet; Refill: 0  3. Hyperlipidemia,  unspecified hyperlipidemia type - atorvastatin (LIPITOR) 20 MG tablet; Take 1 tablet (20 mg total) by mouth daily. (NEEDS TO BE SEEN BEFORE NEXT REFILL)  Dispense: 30 tablet; Refill: 0  4. Chronic midline thoracic back pain - cyclobenzaprine (FLEXERIL) 10 MG tablet; Take 1 tablet (10 mg total) by mouth at bedtime.  Dispense: 90 tablet; Refill: 2  5. Hypothyroidism, unspecified type - levothyroxine (SYNTHROID) 50 MCG tablet; Take 1 tablet (50 mcg total) by mouth daily.  Dispense: 90 tablet; Refill: 1  6. Essential hypertension - losartan (COZAAR) 50 MG tablet; Take 1 tablet (50 mg total) by mouth daily. (NEEDS TO BE SEEN BEFORE NEXT REFILL)  Dispense: 30 tablet; Refill: 0  7. Type 2 diabetes mellitus without complication, without long-term current use of insulin (HCC) - empagliflozin (JARDIANCE) 10 MG TABS tablet; Take 1 tablet (10 mg total) by mouth daily.  Dispense: 90 tablet; Refill: 1 - metFORMIN (GLUCOPHAGE) 1000 MG tablet; Take 1 tablet (1,000 mg total) by mouth 2 (two) times daily with a meal. (NEEDS TO BE SEEN BEFORE NEXT REFILL)  Dispense: 60 tablet; Refill: 0  8. OSA (obstructive sleep apnea)  9.  Obesity (BMI 30-39.9)  10. Depression, major, single episode, mild (HCC) - DULoxetine (CYMBALTA) 60 MG capsule; Take 1 capsule by mouth daily.  Dispense: 90 capsule; Refill: 1  11. Vitamin D deficiency  12. Chronic idiopathic constipation  13. GAD (generalized anxiety disorder)  - DULoxetine (CYMBALTA) 60 MG capsule; Take 1 capsule by mouth daily.  Dispense: 90 capsule; Refill: 1  14. Screening for lung cancer - CT CHEST LUNG CA SCREEN LOW DOSE W/O CM; Future   Labs pending Health Maintenance reviewed Diet and exercise encouraged  Follow up plan: 6 months    Evelina Dun, FNP

## 2022-11-21 NOTE — Patient Instructions (Signed)

## 2022-11-22 LAB — LIPID PANEL
Chol/HDL Ratio: 2.8 ratio (ref 0.0–4.4)
Cholesterol, Total: 140 mg/dL (ref 100–199)
HDL: 50 mg/dL (ref >39)
LDL Chol Calc (NIH): 68 mg/dL (ref 0–99)
Triglycerides: 125 mg/dL (ref 0–149)
VLDL Cholesterol Cal: 22 mg/dL (ref 5–40)

## 2022-11-22 LAB — CMP14+EGFR
ALT: 11 IU/L (ref 0–32)
AST: 14 IU/L (ref 0–40)
Albumin/Globulin Ratio: 1.8 (ref 1.2–2.2)
Albumin: 4.1 g/dL (ref 3.9–4.9)
Alkaline Phosphatase: 132 IU/L — ABNORMAL HIGH (ref 44–121)
BUN/Creatinine Ratio: 12 (ref 12–28)
BUN: 10 mg/dL (ref 8–27)
Bilirubin Total: 0.3 mg/dL (ref 0.0–1.2)
CO2: 20 mmol/L (ref 20–29)
Calcium: 10 mg/dL (ref 8.7–10.3)
Chloride: 99 mmol/L (ref 96–106)
Creatinine, Ser: 0.85 mg/dL (ref 0.57–1.00)
Globulin, Total: 2.3 g/dL (ref 1.5–4.5)
Glucose: 127 mg/dL — ABNORMAL HIGH (ref 70–99)
Potassium: 4.6 mmol/L (ref 3.5–5.2)
Sodium: 137 mmol/L (ref 134–144)
Total Protein: 6.4 g/dL (ref 6.0–8.5)
eGFR: 77 mL/min/{1.73_m2} (ref >59)

## 2022-11-22 LAB — CBC WITH DIFFERENTIAL/PLATELET
Basophils Absolute: 0.1 10*3/uL (ref 0.0–0.2)
Basos: 1 %
EOS (ABSOLUTE): 0.2 10*3/uL (ref 0.0–0.4)
Eos: 2 %
Hematocrit: 30.8 % — ABNORMAL LOW (ref 34.0–46.6)
Hemoglobin: 9.4 g/dL — ABNORMAL LOW (ref 11.1–15.9)
Immature Grans (Abs): 0 10*3/uL (ref 0.0–0.1)
Immature Granulocytes: 0 %
Lymphocytes Absolute: 2.1 10*3/uL (ref 0.7–3.1)
Lymphs: 25 %
MCH: 22.9 pg — ABNORMAL LOW (ref 26.6–33.0)
MCHC: 30.5 g/dL — ABNORMAL LOW (ref 31.5–35.7)
MCV: 75 fL — ABNORMAL LOW (ref 79–97)
Monocytes Absolute: 0.6 10*3/uL (ref 0.1–0.9)
Monocytes: 7 %
Neutrophils Absolute: 5.5 10*3/uL (ref 1.4–7.0)
Neutrophils: 65 %
Platelets: 346 10*3/uL (ref 150–450)
RBC: 4.11 x10E6/uL (ref 3.77–5.28)
RDW: 15.9 % — ABNORMAL HIGH (ref 11.7–15.4)
WBC: 8.5 10*3/uL (ref 3.4–10.8)

## 2022-11-22 LAB — VITAMIN D 25 HYDROXY (VIT D DEFICIENCY, FRACTURES): Vit D, 25-Hydroxy: 83.2 ng/mL (ref 30.0–100.0)

## 2022-11-22 LAB — TSH: TSH: 4.11 u[IU]/mL (ref 0.450–4.500)

## 2022-11-24 ENCOUNTER — Other Ambulatory Visit: Payer: Self-pay | Admitting: Family

## 2022-11-26 LAB — IRON AND TIBC
Iron Saturation: 7 % — CL (ref 15–55)
Iron: 32 ug/dL (ref 27–139)
Total Iron Binding Capacity: 452 ug/dL — ABNORMAL HIGH (ref 250–450)
UIBC: 420 ug/dL — ABNORMAL HIGH (ref 118–369)

## 2022-11-26 LAB — SPECIMEN STATUS REPORT

## 2022-11-26 LAB — B12 AND FOLATE PANEL
Folate: >20 ng/mL (ref >3.0)
Vitamin B-12: 744 pg/mL (ref 232–1245)

## 2022-12-08 ENCOUNTER — Other Ambulatory Visit: Payer: Self-pay

## 2022-12-08 ENCOUNTER — Other Ambulatory Visit: Payer: Self-pay | Admitting: Family

## 2022-12-08 ENCOUNTER — Ambulatory Visit (HOSPITAL_BASED_OUTPATIENT_CLINIC_OR_DEPARTMENT_OTHER)
Admission: RE | Admit: 2022-12-08 | Discharge: 2022-12-08 | Disposition: A | Discharge status: Home / Self Care | Payer: 59 | Source: Ambulatory Visit | Attending: Family | Admitting: Family

## 2022-12-08 DIAGNOSIS — J4489 Other specified chronic obstructive pulmonary disease: Secondary | ICD-10-CM

## 2022-12-08 DIAGNOSIS — Z122 Encounter for screening for malignant neoplasm of respiratory organs: Secondary | ICD-10-CM | POA: Insufficient documentation

## 2022-12-08 DIAGNOSIS — Z87891 Personal history of nicotine dependence: Secondary | ICD-10-CM | POA: Diagnosis not present

## 2022-12-16 ENCOUNTER — Encounter: Payer: Self-pay | Admitting: Family Medicine

## 2022-12-16 ENCOUNTER — Encounter: Payer: Self-pay | Admitting: Family

## 2022-12-16 ENCOUNTER — Other Ambulatory Visit: Payer: Self-pay | Admitting: Family Medicine

## 2022-12-17 ENCOUNTER — Other Ambulatory Visit: Payer: Self-pay

## 2022-12-17 ENCOUNTER — Other Ambulatory Visit: Payer: Self-pay | Admitting: Family

## 2022-12-17 DIAGNOSIS — E559 Vitamin D deficiency, unspecified: Secondary | ICD-10-CM

## 2022-12-18 ENCOUNTER — Other Ambulatory Visit: Payer: Self-pay

## 2022-12-18 ENCOUNTER — Other Ambulatory Visit (HOSPITAL_COMMUNITY): Payer: Self-pay

## 2022-12-18 MED ORDER — VITAMIN D (ERGOCALCIFEROL) 1.25 MG (50000 UNIT) PO CAPS
50000.0000 [IU] | ORAL_CAPSULE | ORAL | 1 refills | Status: DC
  Filled 2022-12-18: qty 12, 84d supply, fill #0
  Filled 2023-03-18: qty 12, 84d supply, fill #1

## 2022-12-21 DIAGNOSIS — G4733 Obstructive sleep apnea (adult) (pediatric): Secondary | ICD-10-CM | POA: Diagnosis not present

## 2022-12-27 ENCOUNTER — Other Ambulatory Visit: Payer: Self-pay | Admitting: Family

## 2022-12-27 DIAGNOSIS — I10 Essential (primary) hypertension: Secondary | ICD-10-CM

## 2022-12-27 DIAGNOSIS — E785 Hyperlipidemia, unspecified: Secondary | ICD-10-CM

## 2022-12-27 DIAGNOSIS — E119 Type 2 diabetes mellitus without complications: Secondary | ICD-10-CM

## 2022-12-27 DIAGNOSIS — E7800 Pure hypercholesterolemia, unspecified: Secondary | ICD-10-CM

## 2022-12-29 ENCOUNTER — Other Ambulatory Visit: Payer: Self-pay

## 2022-12-29 ENCOUNTER — Other Ambulatory Visit (HOSPITAL_COMMUNITY): Payer: Self-pay

## 2022-12-29 MED ORDER — METFORMIN HCL 1000 MG PO TABS
1000.0000 mg | ORAL_TABLET | Freq: Two times a day (BID) | ORAL | 1 refills | Status: DC
  Filled 2022-12-29: qty 60, 30d supply, fill #0
  Filled 2023-01-23: qty 60, 30d supply, fill #1

## 2022-12-29 MED ORDER — ATORVASTATIN CALCIUM 20 MG PO TABS
20.0000 mg | ORAL_TABLET | Freq: Every day | ORAL | 3 refills | Status: DC
  Filled 2022-12-29: qty 30, 30d supply, fill #0
  Filled 2023-01-23: qty 30, 30d supply, fill #1
  Filled 2023-02-21: qty 30, 30d supply, fill #2
  Filled 2023-03-25: qty 30, 30d supply, fill #3

## 2022-12-29 MED ORDER — LOSARTAN POTASSIUM 50 MG PO TABS
50.0000 mg | ORAL_TABLET | Freq: Every day | ORAL | 3 refills | Status: DC
  Filled 2022-12-29: qty 30, 30d supply, fill #0
  Filled 2023-01-23: qty 30, 30d supply, fill #1
  Filled 2023-02-21: qty 30, 30d supply, fill #2
  Filled 2023-03-18: qty 30, 30d supply, fill #3

## 2023-01-20 DIAGNOSIS — G4733 Obstructive sleep apnea (adult) (pediatric): Secondary | ICD-10-CM | POA: Diagnosis not present

## 2023-01-23 ENCOUNTER — Other Ambulatory Visit: Payer: Self-pay

## 2023-02-21 ENCOUNTER — Other Ambulatory Visit (HOSPITAL_COMMUNITY): Payer: Self-pay

## 2023-02-21 ENCOUNTER — Other Ambulatory Visit: Payer: Self-pay | Admitting: Family

## 2023-02-21 DIAGNOSIS — E119 Type 2 diabetes mellitus without complications: Secondary | ICD-10-CM

## 2023-02-23 ENCOUNTER — Other Ambulatory Visit: Payer: Self-pay

## 2023-02-23 ENCOUNTER — Other Ambulatory Visit (HOSPITAL_COMMUNITY): Payer: Self-pay

## 2023-02-23 ENCOUNTER — Encounter: Payer: Self-pay | Admitting: Family

## 2023-02-23 MED ORDER — METFORMIN HCL 1000 MG PO TABS
1000.0000 mg | ORAL_TABLET | Freq: Two times a day (BID) | ORAL | 1 refills | Status: DC
Start: 2023-02-23 — End: 2023-08-12
  Filled 2023-02-23: qty 180, 90d supply, fill #0
  Filled 2023-05-23: qty 180, 90d supply, fill #1

## 2023-02-23 NOTE — Telephone Encounter (Signed)
Left message for pt to call back and schedule appt and I mailed her a letter

## 2023-02-23 NOTE — Telephone Encounter (Signed)
Prescription sent to pharmacy.   Kit Mollett, FNP  

## 2023-02-23 NOTE — Telephone Encounter (Signed)
[facility] pt NTBS 30-d given 01/23/23

## 2023-02-23 NOTE — Addendum Note (Signed)
Addended by: Jannifer Rodney A on: 02/23/2023 04:13 PM   Modules accepted: Orders

## 2023-02-23 NOTE — Telephone Encounter (Signed)
Patient made appt for 9/20 - said she only comes every May and September

## 2023-02-24 ENCOUNTER — Other Ambulatory Visit: Payer: Self-pay

## 2023-03-18 ENCOUNTER — Other Ambulatory Visit (HOSPITAL_COMMUNITY): Payer: Self-pay

## 2023-03-19 ENCOUNTER — Other Ambulatory Visit (HOSPITAL_COMMUNITY): Payer: Self-pay

## 2023-03-19 ENCOUNTER — Other Ambulatory Visit: Payer: Self-pay

## 2023-03-19 ENCOUNTER — Other Ambulatory Visit: Payer: Self-pay | Admitting: Family

## 2023-03-19 MED ORDER — MELOXICAM 15 MG PO TABS
15.0000 mg | ORAL_TABLET | Freq: Every day | ORAL | 1 refills | Status: DC
  Filled 2023-03-19: qty 90, 90d supply, fill #0
  Filled 2023-06-18: qty 90, 90d supply, fill #1

## 2023-03-19 NOTE — Telephone Encounter (Signed)
Last office visit 09/11/22 Last refill 11/21/22, #90 no refills

## 2023-03-20 ENCOUNTER — Other Ambulatory Visit: Payer: Self-pay

## 2023-03-25 ENCOUNTER — Other Ambulatory Visit (HOSPITAL_COMMUNITY): Payer: Self-pay

## 2023-04-17 ENCOUNTER — Other Ambulatory Visit (HOSPITAL_COMMUNITY): Payer: Self-pay

## 2023-04-17 ENCOUNTER — Other Ambulatory Visit: Payer: Self-pay | Admitting: Family

## 2023-04-17 ENCOUNTER — Other Ambulatory Visit: Payer: Self-pay

## 2023-04-17 DIAGNOSIS — E7800 Pure hypercholesterolemia, unspecified: Secondary | ICD-10-CM

## 2023-04-17 DIAGNOSIS — E785 Hyperlipidemia, unspecified: Secondary | ICD-10-CM

## 2023-04-17 DIAGNOSIS — I10 Essential (primary) hypertension: Secondary | ICD-10-CM

## 2023-04-17 MED ORDER — LOSARTAN POTASSIUM 50 MG PO TABS
50.0000 mg | ORAL_TABLET | Freq: Every day | ORAL | 0 refills | Status: DC
Start: 2023-04-17 — End: 2023-05-22
  Filled 2023-04-17: qty 30, 30d supply, fill #0

## 2023-04-17 MED ORDER — ATORVASTATIN CALCIUM 20 MG PO TABS
20.0000 mg | ORAL_TABLET | Freq: Every day | ORAL | 0 refills | Status: DC
Start: 2023-04-17 — End: 2023-06-10
  Filled 2023-04-17 – 2023-05-13 (×2): qty 30, 30d supply, fill #0

## 2023-04-20 ENCOUNTER — Telehealth (INDEPENDENT_AMBULATORY_CARE_PROVIDER_SITE_OTHER): Payer: 59 | Admitting: Family

## 2023-04-20 ENCOUNTER — Encounter: Payer: Self-pay | Admitting: Family

## 2023-04-20 ENCOUNTER — Other Ambulatory Visit: Payer: Self-pay

## 2023-04-20 DIAGNOSIS — N898 Other specified noninflammatory disorders of vagina: Secondary | ICD-10-CM

## 2023-04-20 DIAGNOSIS — T3695XA Adverse effect of unspecified systemic antibiotic, initial encounter: Secondary | ICD-10-CM

## 2023-04-20 DIAGNOSIS — B379 Candidiasis, unspecified: Secondary | ICD-10-CM

## 2023-04-20 DIAGNOSIS — R399 Unspecified symptoms and signs involving the genitourinary system: Secondary | ICD-10-CM | POA: Diagnosis not present

## 2023-04-20 MED ORDER — FLUCONAZOLE 150 MG PO TABS
150.0000 mg | ORAL_TABLET | ORAL | 0 refills | Status: DC | PRN
Start: 2023-04-20 — End: 2023-05-01

## 2023-04-20 MED ORDER — CEPHALEXIN 500 MG PO CAPS
500.0000 mg | ORAL_CAPSULE | Freq: Two times a day (BID) | ORAL | 0 refills | Status: DC
Start: 2023-04-20 — End: 2023-05-01

## 2023-04-20 NOTE — Patient Instructions (Signed)
Urinary Tract Infection, Adult  A urinary tract infection (UTI) is an infection of any part of the urinary tract. The urinary tract includes the kidneys, ureters, bladder, and urethra. These organs make, store, and get rid of urine in the body. An upper UTI affects the ureters and kidneys. A lower UTI affects the bladder and urethra. What are the causes? Most urinary tract infections are caused by bacteria in your genital area around your urethra, where urine leaves your body. These bacteria grow and cause inflammation of your urinary tract. What increases the risk? You are more likely to develop this condition if: You have a urinary catheter that stays in place. You are not able to control when you urinate or have a bowel movement (incontinence). You are female and you: Use a spermicide or diaphragm for birth control. Have low estrogen levels. Are pregnant. You have certain genes that increase your risk. You are sexually active. You take antibiotic medicines. You have a condition that causes your flow of urine to slow down, such as: An enlarged prostate, if you are female. Blockage in your urethra. A kidney stone. A nerve condition that affects your bladder control (neurogenic bladder). Not getting enough to drink, or not urinating often. You have certain medical conditions, such as: Diabetes. A weak disease-fighting system (immunesystem). Sickle cell disease. Gout. Spinal cord injury. What are the signs or symptoms? Symptoms of this condition include: Needing to urinate right away (urgency). Frequent urination. This may include small amounts of urine each time you urinate. Pain or burning with urination. Blood in the urine. Urine that smells bad or unusual. Trouble urinating. Cloudy urine. Vaginal discharge, if you are female. Pain in the abdomen or the lower back. You may also have: Vomiting or a decreased appetite. Confusion. Irritability or tiredness. A fever or  chills. Diarrhea. The first symptom in older adults may be confusion. In some cases, they may not have any symptoms until the infection has worsened. How is this diagnosed? This condition is diagnosed based on your medical history and a physical exam. You may also have other tests, including: Urine tests. Blood tests. Tests for STIs (sexually transmitted infections). If you have had more than one UTI, a cystoscopy or imaging studies may be done to determine the cause of the infections. How is this treated? Treatment for this condition includes: Antibiotic medicine. Over-the-counter medicines to treat discomfort. Drinking enough water to stay hydrated. If you have frequent infections or have other conditions such as a kidney stone, you may need to see a health care provider who specializes in the urinary tract (urologist). In rare cases, urinary tract infections can cause sepsis. Sepsis is a life-threatening condition that occurs when the body responds to an infection. Sepsis is treated in the hospital with IV antibiotics, fluids, and other medicines. Follow these instructions at home:  Medicines Take over-the-counter and prescription medicines only as told by your health care provider. If you were prescribed an antibiotic medicine, take it as told by your health care provider. Do not stop using the antibiotic even if you start to feel better. General instructions Make sure you: Empty your bladder often and completely. Do not hold urine for long periods of time. Empty your bladder after sex. Wipe from front to back after urinating or having a bowel movement if you are female. Use each tissue only one time when you wipe. Drink enough fluid to keep your urine pale yellow. Keep all follow-up visits. This is important. Contact a health   care provider if: Your symptoms do not get better after 1-2 days. Your symptoms go away and then return. Get help right away if: You have severe pain in  your back or your lower abdomen. You have a fever or chills. You have nausea or vomiting. Summary A urinary tract infection (UTI) is an infection of any part of the urinary tract, which includes the kidneys, ureters, bladder, and urethra. Most urinary tract infections are caused by bacteria in your genital area. Treatment for this condition often includes antibiotic medicines. If you were prescribed an antibiotic medicine, take it as told by your health care provider. Do not stop using the antibiotic even if you start to feel better. Keep all follow-up visits. This is important. This information is not intended to replace advice given to you by your health care provider. Make sure you discuss any questions you have with your health care provider. Document Revised: 03/25/2020 Document Reviewed: 03/30/2020 Elsevier Patient Education  2024 [employer] Inc[employer]  

## 2023-04-20 NOTE — Progress Notes (Signed)
Virtual Visit Consent   Brenda Meyer, you are scheduled for a virtual visit with a [facility] provider today. Just as with appointments in the office, your consent must be obtained to participate. Your consent will be active for this visit and any virtual visit you may have with one of our providers in the next 365 days. If you have a MyChart account, a copy of this consent can be sent to you electronically.  As this is a virtual visit, video technology does not allow for your provider to perform a traditional examination. This may limit your provider's ability to fully assess your condition. If your provider identifies any concerns that need to be evaluated in person or the need to arrange testing (such as labs, EKG, etc.), we will make arrangements to do so. Although advances in technology are sophisticated, we cannot ensure that it will always work on either your end or our end. If the connection with a video visit is poor, the visit may have to be switched to a telephone visit. With either a video or telephone visit, we are not always able to ensure that we have a secure connection.  By engaging in this virtual visit, you consent to the provision of healthcare and authorize for your insurance to be billed (if applicable) for the services provided during this visit. Depending on your insurance coverage, you may receive a charge related to this service.  I need to obtain your verbal consent now. Are you willing to proceed with your visit today? Brenda Meyer has provided verbal consent on 04/20/2023 for a virtual visit (video or telephone). Jannifer Rodney, FNP  Date: 04/20/2023 1:35 PM  Virtual Visit via Video Note   I, Jannifer Rodney, connected with  Brenda Meyer  ([id number], August 31, 1961) on 04/20/23 at  4:45 PM EDT by a video-enabled telemedicine application and verified that I am speaking with the correct person using two identifiers.  Location: Patient: Virtual Visit Location Patient: Other:  work Provider: Pharmacist, community: Home Office   I discussed the limitations of evaluation and management by telemedicine and the availability of in person appointments. The patient expressed understanding and agreed to proceed.    History of Present Illness: Brenda Meyer is a 62 y.o. who identifies as a female who was assigned female at birth, and is being seen today for UTI symptoms .  HPI: Dysuria  This is a new problem. The current episode started today. The problem occurs every urination. The problem has been unchanged. The quality of the pain is described as burning. The pain is at a severity of 9/10. The pain is mild. There has been no fever. Associated symptoms include chills, frequency and urgency. Pertinent negatives include no hematuria, hesitancy, nausea or vomiting. She has tried increased fluids for the symptoms. The treatment provided mild relief.    Problems:  Patient Active Problem List   Diagnosis Date Noted   Emphysema with chronic bronchitis 12/08/2022   Irritable bowel syndrome 05/21/2022   GAD (generalized anxiety disorder) 11/22/2021   Tear of meniscus of knee 04/09/2021   Female stress incontinence 04/09/2021   History of hysterectomy 04/09/2021   Constipation 11/01/2018   Hx of smoking 11/12/2016   Atherosclerosis of aorta (HCC) 11/12/2016   Depression, major, single episode, mild (HCC) 08/28/2016   Obesity (BMI 30-39.9) 07/29/2016   Essential hypertension 06/12/2015   Vitamin D deficiency 12/11/2014   Back pain 12/08/2014   Hypothyroidism 12/08/2014   Hyperlipidemia 12/08/2014  Diabetes mellitus, type 2 (HCC) 12/08/2014   OSA (obstructive sleep apnea) 01/21/2011    Allergies:  Allergies  Allergen Reactions   Ace Inhibitors Swelling and Cough   Medications:  Current Outpatient Medications:    cephALEXin (KEFLEX) 500 MG capsule, Take 1 capsule (500 mg total) by mouth 2 (two) times daily., Disp: 14 capsule, Rfl: 0   fluconazole  (DIFLUCAN) 150 MG tablet, Take 1 tablet (150 mg total) by mouth every three (3) days as needed., Disp: 3 tablet, Rfl: 0   aspirin EC 81 MG tablet, Take 1 tablet (81 mg total) by mouth daily., Disp: 90 tablet, Rfl:    atorvastatin (LIPITOR) 20 MG tablet, Take 1 tablet (20 mg total) by mouth daily. (NEEDS TO BE SEEN BEFORE NEXT REFILL), Disp: 30 tablet, Rfl: 0   Calcium Citrate-Vitamin D (CALCIUM + D PO), Take 600 mg by mouth 2 (two) times daily. , Disp: , Rfl:    cyclobenzaprine (FLEXERIL) 10 MG tablet, Take 1 tablet (10 mg total) by mouth at bedtime., Disp: 90 tablet, Rfl: 2   diphenhydrAMINE (BENADRYL) 25 MG tablet, Take 25 mg by mouth at bedtime as needed for sleep., Disp: , Rfl:    DULoxetine (CYMBALTA) 60 MG capsule, Take 1 capsule (60 mg total) by mouth daily., Disp: 90 capsule, Rfl: 1   empagliflozin (JARDIANCE) 10 MG TABS tablet, Take 1 tablet (10 mg total) by mouth daily., Disp: 90 tablet, Rfl: 1   levothyroxine (SYNTHROID) 50 MCG tablet, Take 1 tablet (50 mcg total) by mouth daily., Disp: 90 tablet, Rfl: 1   losartan (COZAAR) 50 MG tablet, Take 1 tablet (50 mg total) by mouth daily. (NEEDS TO BE SEEN BEFORE NEXT REFILL), Disp: 30 tablet, Rfl: 0   meloxicam (MOBIC) 15 MG tablet, Take 1 tablet (15 mg total) by mouth daily., Disp: 90 tablet, Rfl: 1   metFORMIN (GLUCOPHAGE) 1000 MG tablet, Take 1 tablet (1,000 mg total) by mouth 2 (two) times daily with a meal. (NEEDS TO BE SEEN BEFORE NEXT REFILL), Disp: 180 tablet, Rfl: 1   pantoprazole (PROTONIX) 20 MG tablet, Take 1 tablet (20 mg total) by mouth daily., Disp: 90 tablet, Rfl: 1   Vitamin D, Ergocalciferol, (DRISDOL) 1.25 MG (50000 UNIT) CAPS capsule, Take 1 capsule by mouth every 7 days. (NEEDS TO BE SEEN BEFORE NEXT REFILL), Disp: 12 capsule, Rfl: 1  Current Facility-Administered Medications:    Betamethasone Sod Phos & Acet 30 MG/5ML KIT 6 mg, 6 mg, Intra-articular, Once, Columbia Pandey A, FNP  Observations/Objective: Patient is  well-developed, well-nourished in no acute distress.  Resting comfortably  at home.  Head is normocephalic, atraumatic.  No labored breathing.  Speech is clear and coherent with logical content.  Patient is alert and oriented at baseline.    Assessment and Plan: 1. UTI symptoms - cephALEXin (KEFLEX) 500 MG capsule; Take 1 capsule (500 mg total) by mouth 2 (two) times daily.  Dispense: 14 capsule; Refill: 0  2. Vagina itching  3. Antibiotic-induced yeast infection - fluconazole (DIFLUCAN) 150 MG tablet; Take 1 tablet (150 mg total) by mouth every three (3) days as needed.  Dispense: 3 tablet; Refill: 0  Start Keflex BID  Diflucan as needed Keep clean and dry  Need low carb diet  Follow up as needed   Follow Up Instructions: I discussed the assessment and treatment plan with the patient. The patient was provided an opportunity to ask questions and all were answered. The patient agreed with the plan and demonstrated an understanding  of the instructions.  A copy of instructions were sent to the patient via MyChart unless otherwise noted below.    The patient was advised to call back or seek an in-person evaluation if the symptoms worsen or if the condition fails to improve as anticipated.  Time:  I spent 9 minutes with the patient via telehealth technology discussing the above problems/concerns.    Jannifer Rodney, FNP

## 2023-05-01 ENCOUNTER — Ambulatory Visit (HOSPITAL_BASED_OUTPATIENT_CLINIC_OR_DEPARTMENT_OTHER): Payer: 59 | Admitting: Pulmonary Disease

## 2023-05-01 ENCOUNTER — Encounter (HOSPITAL_BASED_OUTPATIENT_CLINIC_OR_DEPARTMENT_OTHER): Payer: Self-pay | Admitting: Pulmonary Disease

## 2023-05-01 VITALS — BP 136/68 | HR 68 | Resp 16 | Ht 68.0 in | Wt 216.8 lb

## 2023-05-01 DIAGNOSIS — J432 Centrilobular emphysema: Secondary | ICD-10-CM | POA: Diagnosis not present

## 2023-05-01 DIAGNOSIS — G4733 Obstructive sleep apnea (adult) (pediatric): Secondary | ICD-10-CM | POA: Diagnosis not present

## 2023-05-01 NOTE — Progress Notes (Signed)
[facility]  Chief Complaint  Patient presents with   Follow-up    Doing fine. OSA on CPAP.   Constitutional:  BP 136/68   Pulse 68   Resp 16   Ht 5\' 8"  (1.727 m)   Wt 216 lb 13.2 oz (98.4 kg)   SpO2 98%   BMI 32.97 kg/m   Past Medical History:  Hypothyroidism, HTN, HLD, DM  Past Surgical History:  Her  has a past surgical history that includes Shoulder surgery; Colonoscopy (11/17/2011); Anal Rectal manometry (N/A, 04/27/2019); and Total abdominal hysterectomy.  Brief Summary:  Brenda Meyer is a 62 y.o. female former smoker with obstructive sleep apnea and centrilobular emphysema.      Subjective:   She uses CPAP nightly.  No issues with mask fit.  Was using benadryl to help with allergies and sleep.  Stopped several weeks ago once she learned long term benadryl use could be associated with risk of dementia.  She had lung cancer screening CT chest.  Showed moderate emphysema.  She is not having cough, wheeze, or sputum.  Only gets winded when she does more strenuous activity.  She has been getting more frequent UTIs since starting jardiance.  Physical Exam:   Appearance - well kempt   ENMT - no sinus tenderness, no oral exudate, no LAN, Mallampati 3 airway, no stridor  Respiratory - equal breath sounds bilaterally, no wheezing or rales  CV - s1s2 regular rate and rhythm, no murmurs  Ext - no clubbing, no edema  Skin - no rashes  Psych - normal mood and affect      Chest imaging:  LDCT chest 12/08/22 >> coronary calcification, moderate emphysema, pulmonary nodules up to 3.7 cm, granulomas in spleen  Sleep Tests:  PSG 2012 >> AHI 29 Auto CPAP 01/31/23 to 04/30/23 >> used on 90 of 90 nights with average 7 hrs 44 min.  Average AHI 1.2 with median CPAP 11 and 95 th percentile CPAP 14 cm H2O  Social History:  She  reports that she quit smoking about 12 years ago. Her smoking use included cigarettes. She started  smoking about 42 years ago. She has a 30 pack-year smoking history. She has never used smokeless tobacco. She reports that she does not drink alcohol and does not use drugs.  Family History:  Her family history includes Brain cancer in her brother; Heart disease in her father and mother; Lung cancer in her sister.     Assessment/Plan:   Centrilobular emphysema. - no significant symptoms - defer inhaler therapy for now - will arrange for pulmonary function test  History of tobacco abuse with small lung nodules. - she will get follow up low dose CT chest scheduled through her PCP for April 2025  Obstructive sleep apnea. - she is compliant with CPAP and reports benefit from therapy - she uses [employer] for her DME - current CPAP ordered September 2022 - continue auto CPAP 5 to 15 cm H2O  Recurrent UTIs. - she will discuss with her PCP  Time Spent Involved in Patient Care on Day of Examination:  27 minutes  Follow up:   Patient Instructions  Will schedule pulmonary function test  Follow up in 1 year  Medication List:   Allergies as of 05/01/2023       Reactions   Ace Inhibitors Swelling, Cough        Medication List        Accurate as of May 01, 2023  8:48 AM. If you have any questions, ask your nurse or doctor.          STOP taking these medications    cephALEXin 500 MG capsule Commonly known as: KEFLEX Stopped by: Coralyn Helling   fluconazole 150 MG tablet Commonly known as: DIFLUCAN Stopped by: Coralyn Helling       TAKE these medications    aspirin EC 81 MG tablet Take 1 tablet (81 mg total) by mouth daily.   atorvastatin 20 MG tablet Commonly known as: LIPITOR Take 1 tablet (20 mg total) by mouth daily. (NEEDS TO BE SEEN BEFORE NEXT REFILL)   bisacodyl 5 MG EC tablet Commonly known as: DULCOLAX Take 10 mg by mouth daily as needed for moderate constipation.   CALCIUM + D PO Take 600 mg by mouth 2 (two) times daily.   cyclobenzaprine 10 MG  tablet Commonly known as: FLEXERIL Take 1 tablet (10 mg total) by mouth at bedtime.   diphenhydrAMINE 25 MG tablet Commonly known as: BENADRYL Take 25 mg by mouth at bedtime as needed for sleep.   DULoxetine 60 MG capsule Commonly known as: CYMBALTA Take 1 capsule (60 mg total) by mouth daily.   Jardiance 10 MG Tabs tablet Generic drug: empagliflozin Take 1 tablet (10 mg total) by mouth daily.   levothyroxine 50 MCG tablet Commonly known as: SYNTHROID Take 1 tablet (50 mcg total) by mouth daily.   losartan 50 MG tablet Commonly known as: COZAAR Take 1 tablet (50 mg total) by mouth daily. (NEEDS TO BE SEEN BEFORE NEXT REFILL)   meloxicam 15 MG tablet Commonly known as: MOBIC Take 1 tablet (15 mg total) by mouth daily.   metFORMIN 1000 MG tablet Commonly known as: GLUCOPHAGE Take 1 tablet (1,000 mg total) by mouth 2 (two) times daily with a meal. (NEEDS TO BE SEEN BEFORE NEXT REFILL)   pantoprazole 20 MG tablet Commonly known as: PROTONIX Take 1 tablet (20 mg total) by mouth daily.   Vitamin D (Ergocalciferol) 1.25 MG (50000 UNIT) Caps capsule Commonly known as: DRISDOL Take 1 capsule by mouth every 7 days. (NEEDS TO BE SEEN BEFORE NEXT REFILL)        Signature:  Coralyn Helling, MD [facility] Pager - [phone] 05/01/2023, 8:48 AM

## 2023-05-01 NOTE — Patient Instructions (Signed)
Will schedule pulmonary function test  Follow up in 1 year

## 2023-05-07 DIAGNOSIS — G4733 Obstructive sleep apnea (adult) (pediatric): Secondary | ICD-10-CM | POA: Diagnosis not present

## 2023-05-11 ENCOUNTER — Telehealth: Payer: Self-pay | Admitting: Family

## 2023-05-11 DIAGNOSIS — Z0279 Encounter for issue of other medical certificate: Secondary | ICD-10-CM

## 2023-05-11 NOTE — Telephone Encounter (Signed)
[employer] faxed FMLA forms to be completed and signed.  Form Fee Paid? (Y/N)     Y     If NO, form is placed on front office manager desk to hold until payment received. If YES, then form will be placed in the RX/HH Nurse Coordinators box for completion.  Form will not be processed until payment is received

## 2023-05-11 NOTE — Telephone Encounter (Signed)
Form completed and forwarded to PCP

## 2023-05-11 NOTE — Telephone Encounter (Signed)
PCP completed and signed FMLA forms. They have been faxed to [employer] at fax number [phone]. MyChart message sent to let pt know this has been completed, copy up front for pt to pickup.

## 2023-05-13 ENCOUNTER — Other Ambulatory Visit (HOSPITAL_COMMUNITY): Payer: Self-pay

## 2023-05-22 ENCOUNTER — Other Ambulatory Visit: Payer: Self-pay

## 2023-05-22 ENCOUNTER — Other Ambulatory Visit: Payer: Self-pay | Admitting: Family

## 2023-05-22 ENCOUNTER — Ambulatory Visit (INDEPENDENT_AMBULATORY_CARE_PROVIDER_SITE_OTHER): Payer: 59 | Admitting: Family

## 2023-05-22 ENCOUNTER — Encounter: Payer: Self-pay | Admitting: Family

## 2023-05-22 ENCOUNTER — Other Ambulatory Visit (HOSPITAL_COMMUNITY): Payer: Self-pay

## 2023-05-22 VITALS — BP 137/74 | HR 71 | Temp 97.1°F | Ht 68.0 in | Wt 212.8 lb

## 2023-05-22 DIAGNOSIS — F320 Major depressive disorder, single episode, mild: Secondary | ICD-10-CM

## 2023-05-22 DIAGNOSIS — I700 Atherosclerosis of aorta: Secondary | ICD-10-CM

## 2023-05-22 DIAGNOSIS — G8929 Other chronic pain: Secondary | ICD-10-CM

## 2023-05-22 DIAGNOSIS — M546 Pain in thoracic spine: Secondary | ICD-10-CM

## 2023-05-22 DIAGNOSIS — E669 Obesity, unspecified: Secondary | ICD-10-CM | POA: Diagnosis not present

## 2023-05-22 DIAGNOSIS — E1169 Type 2 diabetes mellitus with other specified complication: Secondary | ICD-10-CM

## 2023-05-22 DIAGNOSIS — K5904 Chronic idiopathic constipation: Secondary | ICD-10-CM

## 2023-05-22 DIAGNOSIS — I10 Essential (primary) hypertension: Secondary | ICD-10-CM

## 2023-05-22 DIAGNOSIS — G4733 Obstructive sleep apnea (adult) (pediatric): Secondary | ICD-10-CM

## 2023-05-22 DIAGNOSIS — E785 Hyperlipidemia, unspecified: Secondary | ICD-10-CM

## 2023-05-22 DIAGNOSIS — F411 Generalized anxiety disorder: Secondary | ICD-10-CM

## 2023-05-22 DIAGNOSIS — E559 Vitamin D deficiency, unspecified: Secondary | ICD-10-CM

## 2023-05-22 DIAGNOSIS — E039 Hypothyroidism, unspecified: Secondary | ICD-10-CM | POA: Diagnosis not present

## 2023-05-22 DIAGNOSIS — Z7984 Long term (current) use of oral hypoglycemic drugs: Secondary | ICD-10-CM

## 2023-05-22 LAB — BAYER DCA HB A1C WAIVED: HB A1C (BAYER DCA - WAIVED): 7.8 % — ABNORMAL HIGH (ref 4.8–5.6)

## 2023-05-22 MED ORDER — SEMAGLUTIDE(0.25 OR 0.5MG/DOS) 2 MG/3ML ~~LOC~~ SOPN
0.2500 mg | PEN_INJECTOR | SUBCUTANEOUS | 1 refills | Status: DC
Start: 2023-05-22 — End: 2023-06-17
  Filled 2023-05-22: qty 3, 28d supply, fill #0

## 2023-05-22 MED ORDER — LOSARTAN POTASSIUM 50 MG PO TABS
50.0000 mg | ORAL_TABLET | Freq: Every day | ORAL | 1 refills | Status: DC
Start: 2023-05-22 — End: 2023-09-28
  Filled 2023-05-22: qty 90, 90d supply, fill #0
  Filled 2023-08-12: qty 90, 90d supply, fill #1

## 2023-05-22 NOTE — Progress Notes (Signed)
Subjective:    Patient ID: Brenda Meyer, female    DOB: [birth year], 62 y.o.   MRN: [id number]  Chief Complaint  Patient presents with   Medical Management of Chronic Issues    Wants to come of jardiance for uti    Pt presents to the office today for CPE and  chronic follow up.  Pt is followed by Pulmonologist annually for CPAP. Followed by GYN annually for pap and mammogram.    Has atherosclerosis of aorta and takes lipitor.  Wants to change the Jardiance to another medication because of recurrent UTI's.  Hypertension This is a chronic problem. The current episode started more than 1 year ago. The problem has been waxing and waning since onset. The problem is uncontrolled. Associated symptoms include anxiety and malaise/fatigue. Pertinent negatives include no blurred vision, peripheral edema or shortness of breath. Risk factors for coronary artery disease include diabetes mellitus, dyslipidemia, sedentary lifestyle and obesity. The current treatment provides moderate improvement. Identifiable causes of hypertension include a thyroid problem.  Thyroid Problem Presents for follow-up visit. Symptoms include anxiety, constipation and fatigue. The symptoms have been stable. Her past medical history is significant for hyperlipidemia.  Diabetes She presents for her follow-up diabetic visit. She has type 2 diabetes mellitus. Hypoglycemia symptoms include nervousness/anxiousness. Associated symptoms include fatigue. Pertinent negatives for diabetes include no blurred vision and no foot paresthesias. Risk factors for coronary artery disease include dyslipidemia, diabetes mellitus, hypertension, sedentary lifestyle and post-menopausal. She is following a generally healthy diet. Her overall blood glucose range is 130-140 mg/dl. Eye exam is current.  Hyperlipidemia This is a chronic problem. The current episode started more than 1 year ago. The problem is controlled. Recent lipid tests were reviewed and  are normal. Exacerbating diseases include obesity. Pertinent negatives include no shortness of breath. Current antihyperlipidemic treatment includes statins. The current treatment provides moderate improvement of lipids. Risk factors for coronary artery disease include dyslipidemia, diabetes mellitus, hypertension, a sedentary lifestyle and post-menopausal.  Anxiety Presents for follow-up visit. Symptoms include excessive worry, nervous/anxious behavior and restlessness. Patient reports no shortness of breath. Symptoms occur occasionally. The severity of symptoms is mild.    Depression        This is a chronic problem.  The current episode started more than 1 year ago.   Associated symptoms include fatigue, helplessness, hopelessness, restlessness and sad.  Past treatments include SNRIs - Serotonin and norepinephrine reuptake inhibitors.  Past medical history includes thyroid problem and anxiety.   Constipation This is a chronic problem. The current episode started more than 1 year ago. Her stool frequency is 2 to 3 times per week. Associated symptoms include back pain. She has tried stool softeners for the symptoms. The treatment provided mild relief.  Back Pain This is a chronic problem. The current episode started more than 1 year ago. The problem occurs intermittently. The pain is present in the lumbar spine. The quality of the pain is described as aching. The pain is at a severity of 0/10. The patient is experiencing no pain.      Review of Systems  Constitutional:  Positive for fatigue and malaise/fatigue.  Eyes:  Negative for blurred vision.  Respiratory:  Negative for shortness of breath.   Gastrointestinal:  Positive for constipation.  Musculoskeletal:  Positive for back pain.  Psychiatric/Behavioral:  Positive for depression. The patient is nervous/anxious.   All other systems reviewed and are negative.      Objective:   Physical Exam Vitals reviewed.  Constitutional:       General: She is not in acute distress.    Appearance: She is well-developed. She is obese.  HENT:     Head: Normocephalic and atraumatic.     Right Ear: Tympanic membrane normal.     Left Ear: Tympanic membrane normal.  Eyes:     Pupils: Pupils are equal, round, and reactive to light.  Neck:     Thyroid: No thyromegaly.  Cardiovascular:     Rate and Rhythm: Normal rate and regular rhythm.     Heart sounds: Normal heart sounds. No murmur heard. Pulmonary:     Effort: Pulmonary effort is normal. No respiratory distress.     Breath sounds: Normal breath sounds. No wheezing.  Abdominal:     General: Bowel sounds are normal. There is no distension.     Palpations: Abdomen is soft.     Tenderness: There is no abdominal tenderness.  Musculoskeletal:        General: No tenderness. Normal range of motion.     Cervical back: Normal range of motion and neck supple.  Skin:    General: Skin is warm and dry.  Neurological:     Mental Status: She is alert and oriented to person, place, and time.     Cranial Nerves: No cranial nerve deficit.     Deep Tendon Reflexes: Reflexes are normal and symmetric.  Psychiatric:        Behavior: Behavior normal.        Thought Content: Thought content normal.        Judgment: Judgment normal.    Diabetic Foot Exam - Simple   Simple Foot Form Diabetic Foot exam was performed with the following findings: Yes 05/22/2023  9:58 AM  Visual Inspection No deformities, no ulcerations, no other skin breakdown bilaterally: Yes Sensation Testing Intact to touch and monofilament testing bilaterally: Yes Pulse Check Posterior Tibialis and Dorsalis pulse intact bilaterally: Yes Comments       BP (!) 165/76   Pulse 71   Temp (!) 97.1 F (36.2 C) (Temporal)   Ht 5\' 8"  (1.727 m)   Wt 212 lb 12.8 oz (96.5 kg)   SpO2 100%   BMI 32.36 kg/m      Assessment & Plan:  Brenda Meyer comes in today with chief complaint of Medical Management of Chronic Issues  (Wants to come of jardiance for uti )   Diagnosis and orders addressed:  1. Atherosclerosis of aorta (HCC) - CBC with Differential/Platelet - CMP14+EGFR  2. Chronic midline thoracic back pain - CBC with Differential/Platelet - CMP14+EGFR  3. Chronic idiopathic constipation - CBC with Differential/Platelet - CMP14+EGFR  4. Depression, major, single episode, mild (HCC) - CBC with Differential/Platelet - CMP14+EGFR  5. Type 2 diabetes mellitus with other specified complication, without long-term current use of insulin (HCC) Will stop Jardiance and start Ozempic 0.25 mg  Low carb diet  - Bayer DCA Hb A1c Waived - CBC with Differential/Platelet - CMP14+EGFR - Semaglutide,0.25 or 0.5MG /DOS, 2 MG/3ML SOPN; Inject 0.25 mg into the skin once a week.  Dispense: 3 mL; Refill: 1  6. Essential hypertension - CBC with Differential/Platelet - CMP14+EGFR  7. GAD (generalized anxiety disorder) - CBC with Differential/Platelet - CMP14+EGFR  8. Hyperlipidemia, unspecified hyperlipidemia type - CBC with Differential/Platelet - CMP14+EGFR  9. Hypothyroidism, unspecified type - CBC with Differential/Platelet - CMP14+EGFR  10. Obesity (BMI 30-39.9) - CBC with Differential/Platelet - CMP14+EGFR  11. OSA (obstructive sleep apnea) - CBC with Differential/Platelet -  CMP14+EGFR  12. Vitamin D deficiency - CBC with Differential/Platelet - CMP14+EGFR   Labs pending Stop Jardiance and start ozempic  She will monitor BP at home and let me know if >140/90 Health Maintenance reviewed Diet and exercise encouraged  Follow up plan: 6 months    Jannifer Rodney, FNP

## 2023-05-22 NOTE — Patient Instructions (Signed)

## 2023-05-23 ENCOUNTER — Other Ambulatory Visit (HOSPITAL_COMMUNITY): Payer: Self-pay

## 2023-05-23 LAB — CBC WITH DIFFERENTIAL/PLATELET
Basophils Absolute: 0.1 10*3/uL (ref 0.0–0.2)
Basos: 1 %
EOS (ABSOLUTE): 0.2 10*3/uL (ref 0.0–0.4)
Eos: 2 %
Hematocrit: 38.8 % (ref 34.0–46.6)
Hemoglobin: 12.4 g/dL (ref 11.1–15.9)
Immature Grans (Abs): 0 10*3/uL (ref 0.0–0.1)
Immature Granulocytes: 0 %
Lymphocytes Absolute: 2 10*3/uL (ref 0.7–3.1)
Lymphs: 23 %
MCH: 27.4 pg (ref 26.6–33.0)
MCHC: 32 g/dL (ref 31.5–35.7)
MCV: 86 fL (ref 79–97)
Monocytes Absolute: 0.5 10*3/uL (ref 0.1–0.9)
Monocytes: 5 %
Neutrophils Absolute: 5.8 10*3/uL (ref 1.4–7.0)
Neutrophils: 69 %
Platelets: 236 10*3/uL (ref 150–450)
RBC: 4.53 x10E6/uL (ref 3.77–5.28)
RDW: 15.8 % — ABNORMAL HIGH (ref 11.7–15.4)
WBC: 8.5 10*3/uL (ref 3.4–10.8)

## 2023-05-23 LAB — CMP14+EGFR
ALT: 19 IU/L (ref 0–32)
AST: 19 IU/L (ref 0–40)
Albumin: 4.5 g/dL (ref 3.9–4.9)
Alkaline Phosphatase: 120 IU/L (ref 44–121)
BUN/Creatinine Ratio: 8 — ABNORMAL LOW (ref 12–28)
BUN: 6 mg/dL — ABNORMAL LOW (ref 8–27)
Bilirubin Total: 0.4 mg/dL (ref 0.0–1.2)
CO2: 25 mmol/L (ref 20–29)
Calcium: 9.7 mg/dL (ref 8.7–10.3)
Chloride: 101 mmol/L (ref 96–106)
Creatinine, Ser: 0.72 mg/dL (ref 0.57–1.00)
Globulin, Total: 2.2 g/dL (ref 1.5–4.5)
Glucose: 111 mg/dL — ABNORMAL HIGH (ref 70–99)
Potassium: 4.1 mmol/L (ref 3.5–5.2)
Sodium: 141 mmol/L (ref 134–144)
Total Protein: 6.7 g/dL (ref 6.0–8.5)
eGFR: 94 mL/min/{1.73_m2} (ref >59)

## 2023-05-25 ENCOUNTER — Other Ambulatory Visit: Payer: Self-pay

## 2023-05-25 ENCOUNTER — Other Ambulatory Visit: Payer: Self-pay | Admitting: Family

## 2023-05-25 ENCOUNTER — Other Ambulatory Visit (HOSPITAL_COMMUNITY): Payer: Self-pay

## 2023-05-25 DIAGNOSIS — F320 Major depressive disorder, single episode, mild: Secondary | ICD-10-CM

## 2023-05-25 DIAGNOSIS — F411 Generalized anxiety disorder: Secondary | ICD-10-CM

## 2023-05-25 MED ORDER — DULOXETINE HCL 60 MG PO CPEP
60.0000 mg | ORAL_CAPSULE | Freq: Every day | ORAL | 0 refills | Status: DC
Start: 2023-05-25 — End: 2023-08-12
  Filled 2023-05-25 – 2023-06-05 (×4): qty 90, 90d supply, fill #0

## 2023-05-26 ENCOUNTER — Encounter: Payer: Self-pay | Admitting: Pharmacist

## 2023-05-26 ENCOUNTER — Other Ambulatory Visit: Payer: Self-pay

## 2023-05-26 ENCOUNTER — Other Ambulatory Visit (HOSPITAL_COMMUNITY): Payer: Self-pay

## 2023-05-28 ENCOUNTER — Other Ambulatory Visit: Payer: Self-pay

## 2023-05-30 ENCOUNTER — Other Ambulatory Visit (HOSPITAL_COMMUNITY): Payer: Self-pay

## 2023-06-02 ENCOUNTER — Other Ambulatory Visit (HOSPITAL_COMMUNITY): Payer: Self-pay

## 2023-06-05 ENCOUNTER — Other Ambulatory Visit (HOSPITAL_COMMUNITY): Payer: Self-pay

## 2023-06-05 ENCOUNTER — Other Ambulatory Visit: Payer: Self-pay

## 2023-06-06 DIAGNOSIS — G4733 Obstructive sleep apnea (adult) (pediatric): Secondary | ICD-10-CM | POA: Diagnosis not present

## 2023-06-10 ENCOUNTER — Other Ambulatory Visit (HOSPITAL_COMMUNITY): Payer: Self-pay

## 2023-06-10 ENCOUNTER — Ambulatory Visit (HOSPITAL_BASED_OUTPATIENT_CLINIC_OR_DEPARTMENT_OTHER): Payer: 59 | Admitting: Pulmonary Disease

## 2023-06-10 ENCOUNTER — Other Ambulatory Visit: Payer: Self-pay | Admitting: Family

## 2023-06-10 ENCOUNTER — Other Ambulatory Visit: Payer: Self-pay

## 2023-06-10 DIAGNOSIS — E785 Hyperlipidemia, unspecified: Secondary | ICD-10-CM

## 2023-06-10 DIAGNOSIS — E039 Hypothyroidism, unspecified: Secondary | ICD-10-CM

## 2023-06-10 DIAGNOSIS — E559 Vitamin D deficiency, unspecified: Secondary | ICD-10-CM

## 2023-06-10 DIAGNOSIS — E7800 Pure hypercholesterolemia, unspecified: Secondary | ICD-10-CM

## 2023-06-10 DIAGNOSIS — J432 Centrilobular emphysema: Secondary | ICD-10-CM

## 2023-06-10 LAB — PULMONARY FUNCTION TEST
DL/VA % pred: 95 %
DL/VA: 3.96 ml/min/mmHg/L
DLCO cor % pred: 83 %
DLCO cor: 17.63 ml/min/mmHg
DLCO unc % pred: 80 %
DLCO unc: 17.06 ml/min/mmHg
FEF 25-75 Post: 1.84 L/s
FEF 25-75 Pre: 1.24 L/s
FEF2575-%Change-Post: 48 %
FEF2575-%Pred-Post: 79 %
FEF2575-%Pred-Pre: 53 %
FEV1-%Change-Post: 10 %
FEV1-%Pred-Post: 86 %
FEV1-%Pred-Pre: 77 %
FEV1-Post: 2.28 L
FEV1-Pre: 2.05 L
FEV1FVC-%Change-Post: 7 %
FEV1FVC-%Pred-Pre: 91 %
FEV6-%Change-Post: 3 %
FEV6-%Pred-Post: 90 %
FEV6-%Pred-Pre: 87 %
FEV6-Post: 3 L
FEV6-Pre: 2.89 L
FEV6FVC-%Change-Post: 0 %
FEV6FVC-%Pred-Post: 103 %
FEV6FVC-%Pred-Pre: 103 %
FVC-%Change-Post: 2 %
FVC-%Pred-Post: 87 %
FVC-%Pred-Pre: 84 %
FVC-Post: 3 L
FVC-Pre: 2.91 L
Post FEV1/FVC ratio: 76 %
Post FEV6/FVC ratio: 100 %
Pre FEV1/FVC ratio: 71 %
Pre FEV6/FVC Ratio: 99 %
RV % pred: 119 %
RV: 2.56 L
TLC % pred: 101 %
TLC: 5.42 L

## 2023-06-10 MED ORDER — LEVOTHYROXINE SODIUM 50 MCG PO TABS
50.0000 ug | ORAL_TABLET | Freq: Every day | ORAL | 1 refills | Status: DC
  Filled 2023-06-10: qty 90, 90d supply, fill #0
  Filled 2023-08-12 – 2023-09-05 (×2): qty 90, 90d supply, fill #1

## 2023-06-10 MED ORDER — ATORVASTATIN CALCIUM 20 MG PO TABS
20.0000 mg | ORAL_TABLET | Freq: Every day | ORAL | 1 refills | Status: DC
  Filled 2023-06-10: qty 90, 90d supply, fill #0
  Filled 2023-08-12 – 2023-09-05 (×2): qty 90, 90d supply, fill #1

## 2023-06-10 NOTE — Patient Instructions (Signed)
Full PFT Performed Today  

## 2023-06-10 NOTE — Progress Notes (Signed)
Full PFT Performed Today  

## 2023-06-11 ENCOUNTER — Other Ambulatory Visit (HOSPITAL_COMMUNITY): Payer: Self-pay

## 2023-06-11 ENCOUNTER — Other Ambulatory Visit: Payer: Self-pay

## 2023-06-11 MED ORDER — VITAMIN D (ERGOCALCIFEROL) 1.25 MG (50000 UNIT) PO CAPS
50000.0000 [IU] | ORAL_CAPSULE | ORAL | 1 refills | Status: DC
  Filled 2023-06-11: qty 12, 84d supply, fill #0
  Filled 2023-08-29: qty 12, 84d supply, fill #1

## 2023-06-12 ENCOUNTER — Other Ambulatory Visit (HOSPITAL_COMMUNITY): Payer: Self-pay

## 2023-06-17 ENCOUNTER — Other Ambulatory Visit: Payer: Self-pay | Admitting: Family Medicine

## 2023-06-17 ENCOUNTER — Other Ambulatory Visit: Payer: Self-pay

## 2023-06-17 MED ORDER — SEMAGLUTIDE(0.25 OR 0.5MG/DOS) 2 MG/3ML ~~LOC~~ SOPN
0.5000 mg | PEN_INJECTOR | SUBCUTANEOUS | 2 refills | Status: DC
  Filled 2023-06-17: qty 3, 28d supply, fill #0
  Filled 2023-07-16: qty 3, 28d supply, fill #1
  Filled 2023-07-16 – 2023-08-12 (×2): qty 3, 28d supply, fill #2

## 2023-06-17 NOTE — Telephone Encounter (Signed)
I sent in the Ozempic 0.5 mg scrip. Please tel the pt.

## 2023-06-18 ENCOUNTER — Other Ambulatory Visit: Payer: Self-pay | Admitting: Family

## 2023-06-18 ENCOUNTER — Other Ambulatory Visit (HOSPITAL_COMMUNITY): Payer: Self-pay

## 2023-06-18 MED ORDER — PANTOPRAZOLE SODIUM 20 MG PO TBEC
20.0000 mg | DELAYED_RELEASE_TABLET | Freq: Every day | ORAL | 0 refills | Status: DC
  Filled 2023-06-18: qty 90, 90d supply, fill #0

## 2023-06-19 ENCOUNTER — Telehealth: Payer: Self-pay

## 2023-06-19 ENCOUNTER — Other Ambulatory Visit (HOSPITAL_COMMUNITY): Payer: Self-pay

## 2023-06-19 DIAGNOSIS — Z1231 Encounter for screening mammogram for malignant neoplasm of breast: Secondary | ICD-10-CM | POA: Diagnosis not present

## 2023-06-19 DIAGNOSIS — Z1331 Encounter for screening for depression: Secondary | ICD-10-CM | POA: Diagnosis not present

## 2023-06-19 DIAGNOSIS — Z01419 Encounter for gynecological examination (general) (routine) without abnormal findings: Secondary | ICD-10-CM | POA: Diagnosis not present

## 2023-06-19 NOTE — Telephone Encounter (Signed)
Pharmacy Patient Advocate Encounter  Received notification from [facility] that Prior Authorization for Ozempic has been APPROVED from 10.18.24 to 10.18.25. Ran test claim, Copay is $24.99. This test claim was processed through [facility]- copay amounts may vary at other pharmacies due to pharmacy/plan contracts, or as the patient moves through the different stages of their insurance plan.   PA #/Case ID/Reference #: (Key: [id number])

## 2023-06-19 NOTE — Telephone Encounter (Signed)
Deania Hale Bogus ([facility] Fail) Rx #: [id number] Ozempic (0.25 or 0.5 MG/DOSE) 2MG /3ML pen-injectors Form MedImpact ePA Form 2017 NCPDP Created 2 days ago Sent to Plan 2 minutes ago Plan Response 2 minutes ago Submit Clinical Questions less than a minute ago Determination Wait for Determination Please wait for MedImpact 2017 to return a determination.

## 2023-06-22 ENCOUNTER — Encounter: Payer: Self-pay | Admitting: Family

## 2023-06-22 ENCOUNTER — Other Ambulatory Visit (HOSPITAL_COMMUNITY): Payer: Self-pay

## 2023-06-22 NOTE — Telephone Encounter (Signed)
Patient notified

## 2023-06-23 ENCOUNTER — Encounter (HOSPITAL_COMMUNITY): Payer: Self-pay

## 2023-06-23 ENCOUNTER — Other Ambulatory Visit (HOSPITAL_COMMUNITY): Payer: Self-pay

## 2023-07-07 DIAGNOSIS — G4733 Obstructive sleep apnea (adult) (pediatric): Secondary | ICD-10-CM | POA: Diagnosis not present

## 2023-07-15 ENCOUNTER — Other Ambulatory Visit: Payer: Self-pay

## 2023-07-16 ENCOUNTER — Other Ambulatory Visit: Payer: Self-pay

## 2023-08-12 ENCOUNTER — Other Ambulatory Visit: Payer: Self-pay | Admitting: Family

## 2023-08-12 ENCOUNTER — Other Ambulatory Visit (HOSPITAL_COMMUNITY): Payer: Self-pay

## 2023-08-12 ENCOUNTER — Other Ambulatory Visit: Payer: Self-pay

## 2023-08-12 DIAGNOSIS — G8929 Other chronic pain: Secondary | ICD-10-CM

## 2023-08-12 DIAGNOSIS — F320 Major depressive disorder, single episode, mild: Secondary | ICD-10-CM

## 2023-08-12 DIAGNOSIS — F411 Generalized anxiety disorder: Secondary | ICD-10-CM

## 2023-08-12 DIAGNOSIS — E119 Type 2 diabetes mellitus without complications: Secondary | ICD-10-CM

## 2023-08-12 MED ORDER — METFORMIN HCL 1000 MG PO TABS
1000.0000 mg | ORAL_TABLET | Freq: Two times a day (BID) | ORAL | 0 refills | Status: DC
  Filled 2023-08-12: qty 180, 90d supply, fill #0

## 2023-08-12 MED ORDER — MELOXICAM 15 MG PO TABS
15.0000 mg | ORAL_TABLET | Freq: Every day | ORAL | 0 refills | Status: DC
  Filled 2023-08-12 – 2023-09-05 (×2): qty 90, 90d supply, fill #0

## 2023-08-12 MED ORDER — DULOXETINE HCL 60 MG PO CPEP
60.0000 mg | ORAL_CAPSULE | Freq: Every day | ORAL | 0 refills | Status: DC
  Filled 2023-08-12 – 2023-08-29 (×2): qty 90, 90d supply, fill #0

## 2023-08-13 ENCOUNTER — Other Ambulatory Visit (HOSPITAL_COMMUNITY): Payer: Self-pay

## 2023-08-13 MED ORDER — CYCLOBENZAPRINE HCL 10 MG PO TABS
10.0000 mg | ORAL_TABLET | Freq: Every day | ORAL | 2 refills | Status: DC
  Filled 2023-08-13 – 2023-09-05 (×2): qty 90, 90d supply, fill #0
  Filled 2023-12-08: qty 90, 90d supply, fill #1
  Filled 2024-02-13 – 2024-03-02 (×2): qty 90, 90d supply, fill #2

## 2023-08-29 ENCOUNTER — Other Ambulatory Visit (HOSPITAL_COMMUNITY): Payer: Self-pay

## 2023-08-31 ENCOUNTER — Other Ambulatory Visit: Payer: Self-pay

## 2023-09-05 ENCOUNTER — Other Ambulatory Visit (HOSPITAL_COMMUNITY): Payer: Self-pay

## 2023-09-07 ENCOUNTER — Other Ambulatory Visit (HOSPITAL_COMMUNITY): Payer: Self-pay

## 2023-09-16 ENCOUNTER — Other Ambulatory Visit: Payer: Self-pay

## 2023-09-16 ENCOUNTER — Encounter: Payer: Self-pay | Admitting: Family

## 2023-09-16 ENCOUNTER — Other Ambulatory Visit: Payer: Self-pay | Admitting: Family Medicine

## 2023-09-16 ENCOUNTER — Other Ambulatory Visit: Payer: Self-pay | Admitting: Family

## 2023-09-16 MED ORDER — OZEMPIC (0.25 OR 0.5 MG/DOSE) 2 MG/3ML ~~LOC~~ SOPN
0.5000 mg | PEN_INJECTOR | SUBCUTANEOUS | 0 refills | Status: DC
  Filled 2023-09-16: qty 3, 28d supply, fill #0

## 2023-09-16 NOTE — Telephone Encounter (Signed)
 [facility] NTBS in March for 6 mos FU RF sent to pharmacy

## 2023-09-16 NOTE — Telephone Encounter (Signed)
 I called pt & LMTCB to make an appt around November 19, 2023 w/[facility] for ckup. Also, I sent pt a letter about this!

## 2023-09-17 ENCOUNTER — Other Ambulatory Visit: Payer: Self-pay

## 2023-09-17 ENCOUNTER — Other Ambulatory Visit (HOSPITAL_COMMUNITY): Payer: Self-pay

## 2023-09-17 MED ORDER — OZEMPIC (0.25 OR 0.5 MG/DOSE) 2 MG/3ML ~~LOC~~ SOPN
0.5000 mg | PEN_INJECTOR | SUBCUTANEOUS | 0 refills | Status: DC
  Filled 2023-09-17 – 2023-10-10 (×2): qty 9, 84d supply, fill #0

## 2023-09-25 ENCOUNTER — Encounter (INDEPENDENT_AMBULATORY_CARE_PROVIDER_SITE_OTHER): Payer: Self-pay

## 2023-09-25 DIAGNOSIS — I10 Essential (primary) hypertension: Secondary | ICD-10-CM

## 2023-09-28 ENCOUNTER — Other Ambulatory Visit (HOSPITAL_COMMUNITY): Payer: Self-pay

## 2023-09-28 ENCOUNTER — Other Ambulatory Visit (HOSPITAL_BASED_OUTPATIENT_CLINIC_OR_DEPARTMENT_OTHER): Payer: Self-pay

## 2023-09-28 MED ORDER — LOSARTAN POTASSIUM 100 MG PO TABS
100.0000 mg | ORAL_TABLET | Freq: Every day | ORAL | 1 refills | Status: DC
  Filled 2023-09-28: qty 90, 90d supply, fill #0

## 2023-09-28 NOTE — Telephone Encounter (Signed)
Hello, I have sent Losartan 100 mg to your pharmacy. Keep your chronic follow up so we can recheck kidney function.   Jannifer Rodney, FNP  Approximately 5 minutes was spent documenting and reviewing patient's chart.

## 2023-09-28 NOTE — Addendum Note (Signed)
Addended by: Jannifer Rodney A on: 09/28/2023 08:51 AM   Modules accepted: Orders

## 2023-10-10 ENCOUNTER — Other Ambulatory Visit: Payer: Self-pay | Admitting: Family Medicine

## 2023-10-10 ENCOUNTER — Other Ambulatory Visit (HOSPITAL_COMMUNITY): Payer: Self-pay

## 2023-10-12 ENCOUNTER — Other Ambulatory Visit: Payer: Self-pay

## 2023-10-12 ENCOUNTER — Encounter: Payer: Self-pay | Admitting: Pharmacist

## 2023-10-12 ENCOUNTER — Other Ambulatory Visit (HOSPITAL_COMMUNITY): Payer: Self-pay

## 2023-10-12 MED ORDER — PANTOPRAZOLE SODIUM 20 MG PO TBEC
20.0000 mg | DELAYED_RELEASE_TABLET | Freq: Every day | ORAL | 0 refills | Status: DC
  Filled 2023-10-12: qty 90, 90d supply, fill #0

## 2023-11-13 ENCOUNTER — Ambulatory Visit (INDEPENDENT_AMBULATORY_CARE_PROVIDER_SITE_OTHER): Payer: 59 | Admitting: Family

## 2023-11-13 ENCOUNTER — Encounter: Payer: Self-pay | Admitting: Family

## 2023-11-13 ENCOUNTER — Other Ambulatory Visit (HOSPITAL_COMMUNITY): Payer: Self-pay

## 2023-11-13 ENCOUNTER — Other Ambulatory Visit: Payer: Self-pay

## 2023-11-13 VITALS — BP 125/73 | HR 71 | Temp 97.1°F | Wt 181.2 lb

## 2023-11-13 DIAGNOSIS — E039 Hypothyroidism, unspecified: Secondary | ICD-10-CM

## 2023-11-13 DIAGNOSIS — G4733 Obstructive sleep apnea (adult) (pediatric): Secondary | ICD-10-CM | POA: Diagnosis not present

## 2023-11-13 DIAGNOSIS — E785 Hyperlipidemia, unspecified: Secondary | ICD-10-CM | POA: Diagnosis not present

## 2023-11-13 DIAGNOSIS — M546 Pain in thoracic spine: Secondary | ICD-10-CM

## 2023-11-13 DIAGNOSIS — F411 Generalized anxiety disorder: Secondary | ICD-10-CM

## 2023-11-13 DIAGNOSIS — E559 Vitamin D deficiency, unspecified: Secondary | ICD-10-CM | POA: Diagnosis not present

## 2023-11-13 DIAGNOSIS — E119 Type 2 diabetes mellitus without complications: Secondary | ICD-10-CM | POA: Diagnosis not present

## 2023-11-13 DIAGNOSIS — Z0001 Encounter for general adult medical examination with abnormal findings: Secondary | ICD-10-CM | POA: Diagnosis not present

## 2023-11-13 DIAGNOSIS — Z0000 Encounter for general adult medical examination without abnormal findings: Secondary | ICD-10-CM | POA: Diagnosis not present

## 2023-11-13 DIAGNOSIS — I700 Atherosclerosis of aorta: Secondary | ICD-10-CM

## 2023-11-13 DIAGNOSIS — F320 Major depressive disorder, single episode, mild: Secondary | ICD-10-CM

## 2023-11-13 DIAGNOSIS — E7800 Pure hypercholesterolemia, unspecified: Secondary | ICD-10-CM | POA: Diagnosis not present

## 2023-11-13 DIAGNOSIS — I10 Essential (primary) hypertension: Secondary | ICD-10-CM

## 2023-11-13 DIAGNOSIS — G8929 Other chronic pain: Secondary | ICD-10-CM

## 2023-11-13 DIAGNOSIS — K5904 Chronic idiopathic constipation: Secondary | ICD-10-CM

## 2023-11-13 LAB — BAYER DCA HB A1C WAIVED: HB A1C (BAYER DCA - WAIVED): 5.8 % — ABNORMAL HIGH (ref 4.8–5.6)

## 2023-11-13 MED ORDER — MELOXICAM 15 MG PO TABS
15.0000 mg | ORAL_TABLET | Freq: Every day | ORAL | 0 refills | Status: AC
  Filled 2023-11-13: qty 90, 90d supply, fill #0

## 2023-11-13 MED ORDER — LEVOTHYROXINE SODIUM 50 MCG PO TABS
50.0000 ug | ORAL_TABLET | Freq: Every day | ORAL | 1 refills | Status: DC
  Filled 2023-11-13: qty 90, 90d supply, fill #0
  Filled 2024-03-02: qty 90, 90d supply, fill #1

## 2023-11-13 MED ORDER — OZEMPIC (0.25 OR 0.5 MG/DOSE) 2 MG/3ML ~~LOC~~ SOPN
0.5000 mg | PEN_INJECTOR | SUBCUTANEOUS | 0 refills | Status: DC
  Filled 2023-11-13 – 2024-01-02 (×2): qty 9, 84d supply, fill #0

## 2023-11-13 MED ORDER — ATORVASTATIN CALCIUM 20 MG PO TABS
20.0000 mg | ORAL_TABLET | Freq: Every day | ORAL | 1 refills | Status: DC
  Filled 2023-11-13: qty 90, 90d supply, fill #0
  Filled 2024-02-13: qty 90, 90d supply, fill #1

## 2023-11-13 MED ORDER — PREDNISONE 20 MG PO TABS
40.0000 mg | ORAL_TABLET | Freq: Every day | ORAL | 0 refills | Status: AC
  Filled 2023-11-13: qty 10, 5d supply, fill #0

## 2023-11-13 MED ORDER — VITAMIN D (ERGOCALCIFEROL) 1.25 MG (50000 UNIT) PO CAPS
50000.0000 [IU] | ORAL_CAPSULE | ORAL | 1 refills | Status: DC
  Filled 2023-11-13: qty 12, 84d supply, fill #0
  Filled 2024-02-13: qty 12, 84d supply, fill #1

## 2023-11-13 MED ORDER — METFORMIN HCL 1000 MG PO TABS
1000.0000 mg | ORAL_TABLET | Freq: Two times a day (BID) | ORAL | 0 refills | Status: DC
  Filled 2023-11-13: qty 180, 90d supply, fill #0

## 2023-11-13 MED ORDER — LOSARTAN POTASSIUM 100 MG PO TABS
100.0000 mg | ORAL_TABLET | Freq: Every day | ORAL | 1 refills | Status: DC
Start: 2023-11-13 — End: 2024-06-18
  Filled 2023-11-13 – 2023-12-23 (×2): qty 90, 90d supply, fill #0
  Filled 2024-03-22: qty 90, 90d supply, fill #1

## 2023-11-13 MED ORDER — PANTOPRAZOLE SODIUM 20 MG PO TBEC
20.0000 mg | DELAYED_RELEASE_TABLET | Freq: Every day | ORAL | 0 refills | Status: DC
  Filled 2023-11-13 – 2024-02-13 (×2): qty 90, 90d supply, fill #0

## 2023-11-13 MED ORDER — DULOXETINE HCL 60 MG PO CPEP
60.0000 mg | ORAL_CAPSULE | Freq: Every day | ORAL | 0 refills | Status: DC
  Filled 2023-11-13: qty 90, 90d supply, fill #0

## 2023-11-13 NOTE — Patient Instructions (Signed)
Thoracic Strain A thoracic strain is an injury to the muscles or tendons that attach to the upper part of your back behind your chest. Tendons are tissues that connect muscle to bone. This injury is sometimes called a mid-back strain. It happens when a muscle is stretched too far or overloaded. Thoracic strains can range from mild to severe. Mild strains may involve stretching a muscle or tendon without tearing it. These injuries may heal in 1-2 weeks. More severe strains involve tearing of muscle fibers or tendons. These will cause more pain and may take 6-8 weeks to heal. What are the causes? This condition may be caused by: Trauma, such as a fall or a hit to the body. Twisting or overstretching the back. This may result from doing activities that take a lot of energy, such as lifting heavy objects. In some cases, the cause may not be known. What increases the risk? This injury is more common in: Athletes. People with obesity. What are the signs or symptoms? The main symptom of this condition is pain in the middle back, especially with movement. Other symptoms include: Stiffness or limited range of motion. Sudden muscle tightening (spasms). How is this diagnosed? This condition may be diagnosed based on: Your symptoms. Your medical history. A physical exam. Imaging tests, such as X-rays, a CT scan, or an MRI. How is this treated? This condition may be treated with: Resting the injured area. Applying heat and cold to the injured area. Over-the-counter medicines for pain and inflammation, such as NSAIDs. Prescription pain medicine or muscle relaxants. These may be needed for a short time. Doing exercises to improve movement and strength in your back (physical therapy). Treatments applied to the painful area, such as: Electrical stimulation. Stimulating the muscle with small needles (dry needling). Injections of medicine (trigger point injections). Follow these instructions at  home: Managing pain, stiffness, and swelling     If told, put ice on the injured area. Put ice in a plastic bag. Place a towel between your skin and the bag. Leave the ice on for 20 minutes, 2-3 times a day. If told, apply heat to the affected area as often as told by your health care provider. Use the heat source that your provider recommends, such as a moist heat pack or a heating pad. Place a towel between your skin and the heat source. Leave the heat on for 20-30 minutes. If your skin turns bright red, remove the ice or heat right away to prevent skin damage. The risk of damage is higher if you cannot feel pain, heat, or cold. Activity Rest and return to your normal activities as told by your provider. Ask your provider what activities are safe for you. Do exercises as told by your provider. Medicines Take over-the-counter and prescription medicines only as told by your provider. Ask your provider if the medicine prescribed to you: Requires you to avoid driving or using machinery. Can cause constipation. You may need to take these actions to prevent or treat constipation: Drink enough fluid to keep your pee (urine) pale yellow. Take over-the-counter or prescription medicines. Eat foods that are high in fiber, such as beans, whole grains, and fresh fruits and vegetables. Limit foods that are high in fat and processed sugars, such as fried or sweet foods. General instructions Do not use any products that contain nicotine or tobacco. These products include cigarettes, chewing tobacco, and vaping devices, such as e-cigarettes. If you need help quitting, ask your provider. Keep all follow-up  visits. Your provider will monitor your injury and activity. How is this prevented? To prevent a future mid-back injury: Always warm up before physical activity or sports. Cool down and stretch after being active. Use correct form when playing sports and lifting heavy objects. Bend your knees  before you lift heavy objects. Use good posture when sitting and standing. Stay physically fit and maintain a healthy weight. Do at least 150 minutes of moderate-intensity exercise each week, such as brisk walking or water aerobics. Do strength exercises at least 2 times each week. Contact a health care provider if: Your pain is not helped by medicine. Your pain or stiffness is getting worse. You develop pain or stiffness in your neck or lower back. Get help right away if: You have shortness of breath. You have chest pain. You have numbness, tingling, or weakness in your legs. You are not able to control when you pee (urinary incontinence). These symptoms may be an emergency. Get help right away. Call 911. Do not wait to see if the symptoms will go away. Do not drive yourself to the hospital. This information is not intended to replace advice given to you by your health care provider. Make sure you discuss any questions you have with your health care provider. Document Revised: 12/22/2022 Document Reviewed: 04/07/2022 Elsevier Patient Education  2024 [employer].

## 2023-11-13 NOTE — Progress Notes (Signed)
 Subjective:    Patient ID: Brenda Meyer, female    DOB: 1960-11-19, 63 y.o.   MRN: [id number]  Chief Complaint  Patient presents with   Medical Management of Chronic Issues   Pt presents to the office today for CPE and  chronic follow up.   Pt is followed by Pulmonologist annually for OSA. She wears her CPAP nightly and doing well.  Followed by GYN annually for pap and mammogram.    Has atherosclerosis of aorta and takes lipitor.   Complaining of thoracic back that started 3 weeks ago. Reports she has been taking her mobic and using a heating pad with mild relief. Reports pain of 5 out 10.  Hypertension This is a chronic problem. The current episode started more than 1 year ago. The problem has been resolved since onset. The problem is controlled. Associated symptoms include anxiety and malaise/fatigue. Pertinent negatives include no blurred vision, peripheral edema or shortness of breath. Risk factors for coronary artery disease include obesity, dyslipidemia and sedentary lifestyle. The current treatment provides moderate improvement. Identifiable causes of hypertension include a thyroid problem.  Thyroid Problem Presents for follow-up visit. Symptoms include constipation and fatigue. Patient reports no anxiety, diarrhea or dry skin. The symptoms have been stable.  Diabetes She presents for her follow-up diabetic visit. She has type 2 diabetes mellitus. Pertinent negatives for hypoglycemia include no nervousness/anxiousness. Associated symptoms include fatigue. Pertinent negatives for diabetes include no blurred vision and no foot paresthesias. Symptoms are stable. Pertinent negatives for diabetic complications include no peripheral neuropathy. Risk factors for coronary artery disease include dyslipidemia, diabetes mellitus, hypertension, sedentary lifestyle and post-menopausal. She is following a generally healthy diet. Her overall blood glucose range is 90-110 mg/dl.  Hyperlipidemia This  is a chronic problem. The current episode started more than 1 year ago. The problem is controlled. She has no history of obesity. Pertinent negatives include no shortness of breath. Current antihyperlipidemic treatment includes diet change and statins. The current treatment provides moderate improvement of lipids. Risk factors for coronary artery disease include diabetes mellitus, dyslipidemia, hypertension and a sedentary lifestyle.  Anxiety Presents for follow-up visit. Symptoms include excessive worry and restlessness. Patient reports no nervous/anxious behavior, shortness of breath or suicidal ideas. Symptoms occur occasionally. The severity of symptoms is mild.    Depression        This is a chronic problem.  The current episode started more than 1 year ago.   Associated symptoms include fatigue, helplessness, hopelessness, restlessness and sad.  Associated symptoms include no suicidal ideas.  Past treatments include SNRIs - Serotonin and norepinephrine reuptake inhibitors.  Past medical history includes thyroid problem and anxiety.   Back Pain This is a chronic problem. The current episode started more than 1 year ago. The problem occurs intermittently. The problem has been waxing and waning since onset. The pain is present in the lumbar spine. The quality of the pain is described as aching. The pain is at a severity of 5/10. The pain is mild. She has tried home exercises for the symptoms. The treatment provided mild relief.      Review of Systems  Constitutional:  Positive for fatigue and malaise/fatigue.  Eyes:  Negative for blurred vision.  Respiratory:  Negative for shortness of breath.   Gastrointestinal:  Positive for constipation. Negative for diarrhea.  Musculoskeletal:  Positive for back pain.  Psychiatric/Behavioral:  Negative for suicidal ideas. The patient is not nervous/anxious.   All other systems reviewed and are negative.  Family History  Problem Relation Age of Onset    Heart disease Mother    Heart disease Father    Brain cancer Brother    Lung cancer Sister    Social History   Socioeconomic History   Marital status: Married    Spouse name: Not on file   Number of children: Not on file   Years of education: Not on file   Highest education level: Bachelor's degree (e.g., BA, AB, BS)  Occupational History   Occupation: nurse    Employer: [employer]  Tobacco Use   Smoking status: Former    Current packs/day: 0.00    Average packs/day: 1 pack/day for 30.0 years (30.0 ttl pk-yrs)    Types: Cigarettes    Start date: 10/02/1980    Quit date: 10/02/2010    Years since quitting: 13.1   Smokeless tobacco: Never  Vaping Use   Vaping status: Never Used  Substance and Sexual Activity   Alcohol use: No   Drug use: Never   Sexual activity: Not on file  Other Topics Concern   Not on file  Social History Narrative   Not on file   Social Drivers of Health   Financial Resource Strain: Low Risk  (11/12/2023)   Overall Financial Resource Strain (CARDIA)    Difficulty of Paying Living Expenses: Not hard at all  Food Insecurity: No Food Insecurity (11/12/2023)   Hunger Vital Sign    Worried About Running Out of Food in the Last Year: Never true    Ran Out of Food in the Last Year: Never true  Transportation Needs: No Transportation Needs (11/12/2023)   PRAPARE - Administrator, Civil Service (Medical): No    Lack of Transportation (Non-Medical): No  Physical Activity: Unknown (11/12/2023)   Exercise Vital Sign    Days of Exercise per Week: 0 days    Minutes of Exercise per Session: Not on file  Stress: No Stress Concern Present (11/12/2023)   [employer] of Occupational Health - Occupational Stress Questionnaire    Feeling of Stress : Only a little  Social Connections: Socially Isolated (11/12/2023)   Social Connection and Isolation Panel [NHANES]    Frequency of Communication with Friends and Family: Never    Frequency of Social  Gatherings with Friends and Family: Once a week    Attends Religious Services: Never    Database administrator or Organizations: No    Attends Engineer, structural: Not on file    Marital Status: Married        Objective:   Physical Exam Vitals reviewed.  Constitutional:      General: She is not in acute distress.    Appearance: She is well-developed.  HENT:     Head: Normocephalic and atraumatic.     Right Ear: Tympanic membrane normal.     Left Ear: Tympanic membrane normal.  Eyes:     Pupils: Pupils are equal, round, and reactive to light.  Neck:     Thyroid: No thyromegaly.  Cardiovascular:     Rate and Rhythm: Normal rate and regular rhythm.     Heart sounds: Normal heart sounds. No murmur heard. Pulmonary:     Effort: Pulmonary effort is normal. No respiratory distress.     Breath sounds: Normal breath sounds. No wheezing.  Abdominal:     General: Bowel sounds are normal. There is no distension.     Palpations: Abdomen is soft.     Tenderness: There  is no abdominal tenderness.  Musculoskeletal:        General: No tenderness. Normal range of motion.       Arms:     Cervical back: Normal range of motion and neck supple.  Skin:    General: Skin is warm and dry.  Neurological:     Mental Status: She is alert and oriented to person, place, and time.     Cranial Nerves: No cranial nerve deficit.     Deep Tendon Reflexes: Reflexes are normal and symmetric.  Psychiatric:        Behavior: Behavior normal.        Thought Content: Thought content normal.        Judgment: Judgment normal.         BP 125/73   Pulse 71   Temp (!) 97.1 F (36.2 C) (Temporal)   Wt 181 lb 3.2 oz (82.2 kg)   SpO2 98%   BMI 29.47 kg/m   Assessment & Plan:  DARSHA ZUMSTEIN comes in today with chief complaint of Medical Management of Chronic Issues   Diagnosis and orders addressed:  1. Pure hypercholesterolemia - atorvastatin (LIPITOR) 20 MG tablet; Take 1 tablet (20 mg  total) by mouth daily.  Dispense: 90 tablet; Refill: 1 - CMP14+EGFR - CBC with Differential/Platelet  2. Hyperlipidemia, unspecified hyperlipidemia type - atorvastatin (LIPITOR) 20 MG tablet; Take 1 tablet (20 mg total) by mouth daily.  Dispense: 90 tablet; Refill: 1 - CMP14+EGFR - CBC with Differential/Platelet - Lipid panel  3. Depression, major, single episode, mild (HCC) - DULoxetine (CYMBALTA) 60 MG capsule; Take 1 capsule (60 mg total) by mouth daily.  Dispense: 90 capsule; Refill: 0 - CMP14+EGFR - CBC with Differential/Platelet  4. GAD (generalized anxiety disorder) - DULoxetine (CYMBALTA) 60 MG capsule; Take 1 capsule (60 mg total) by mouth daily.  Dispense: 90 capsule; Refill: 0 - CMP14+EGFR - CBC with Differential/Platelet  5. Hypothyroidism, unspecified type - levothyroxine (SYNTHROID) 50 MCG tablet; Take 1 tablet (50 mcg total) by mouth daily.  Dispense: 90 tablet; Refill: 1 - CMP14+EGFR - CBC with Differential/Platelet - TSH  6. Type 2 diabetes mellitus without complication, without long-term current use of insulin (HCC) - metFORMIN (GLUCOPHAGE) 1000 MG tablet; Take 1 tablet (1,000 mg total) by mouth 2 (two) times daily with a meal. (NEEDS TO BE SEEN BEFORE NEXT REFILL)  Dispense: 180 tablet; Refill: 0 - Semaglutide,0.25 or 0.5MG /DOS, (OZEMPIC, 0.25 OR 0.5 MG/DOSE,) 2 MG/3ML SOPN; Inject 0.5 mg into the skin once a week.  Dispense: 9 mL; Refill: 0 - Bayer DCA Hb A1c Waived - CMP14+EGFR - CBC with Differential/Platelet - Microalbumin / creatinine urine ratio  7. Vitamin D deficiency - Vitamin D, Ergocalciferol, (DRISDOL) 1.25 MG (50000 UNIT) CAPS capsule; Take 1 capsule by mouth every 7 days. (NEEDS TO BE SEEN BEFORE NEXT REFILL)  Dispense: 12 capsule; Refill: 1 - CMP14+EGFR - CBC with Differential/Platelet  8. Annual physical exam (Primary) - CMP14+EGFR - CBC with Differential/Platelet - Microalbumin / creatinine urine ratio  9. OSA (obstructive sleep  apnea) - CMP14+EGFR - CBC with Differential/Platelet  10. Chronic midline thoracic back pain - meloxicam (MOBIC) 15 MG tablet; Take 1 tablet (15 mg total) by mouth daily.  Dispense: 90 tablet; Refill: 0 - CMP14+EGFR - CBC with Differential/Platelet - predniSONE (DELTASONE) 20 MG tablet; Take 2 tablets (40 mg total) by mouth daily with breakfast for 5 days.  Dispense: 10 tablet; Refill: 0  11. Essential hypertension - losartan (COZAAR) 100 MG  tablet; Take 1 tablet (100 mg total) by mouth daily.  Dispense: 90 tablet; Refill: 1 - CMP14+EGFR - CBC with Differential/Platelet  12. Atherosclerosis of aorta (HCC) - CMP14+EGFR - CBC with Differential/Platelet  13. Chronic idiopathic constipation - CMP14+EGFR - CBC with Differential/Platelet   Labs pending Continue current medications  Health Maintenance reviewed Diet and exercise encouraged  Follow up plan: 6 months    Jannifer Rodney, FNP

## 2023-11-14 LAB — CBC WITH DIFFERENTIAL/PLATELET
Basophils Absolute: 0.1 10*3/uL (ref 0.0–0.2)
Basos: 1 %
EOS (ABSOLUTE): 0.2 10*3/uL (ref 0.0–0.4)
Eos: 2 %
Hematocrit: 39.1 % (ref 34.0–46.6)
Hemoglobin: 12.9 g/dL (ref 11.1–15.9)
Immature Grans (Abs): 0 10*3/uL (ref 0.0–0.1)
Immature Granulocytes: 0 %
Lymphocytes Absolute: 2.3 10*3/uL (ref 0.7–3.1)
Lymphs: 23 %
MCH: 29.1 pg (ref 26.6–33.0)
MCHC: 33 g/dL (ref 31.5–35.7)
MCV: 88 fL (ref 79–97)
Monocytes Absolute: 0.6 10*3/uL (ref 0.1–0.9)
Monocytes: 6 %
Neutrophils Absolute: 7 10*3/uL (ref 1.4–7.0)
Neutrophils: 68 %
Platelets: 293 10*3/uL (ref 150–450)
RBC: 4.44 x10E6/uL (ref 3.77–5.28)
RDW: 13.9 % (ref 11.7–15.4)
WBC: 10.1 10*3/uL (ref 3.4–10.8)

## 2023-11-14 LAB — LIPID PANEL
Chol/HDL Ratio: 2.5 ratio (ref 0.0–4.4)
Cholesterol, Total: 125 mg/dL (ref 100–199)
HDL: 51 mg/dL (ref >39)
LDL Chol Calc (NIH): 53 mg/dL (ref 0–99)
Triglycerides: 116 mg/dL (ref 0–149)
VLDL Cholesterol Cal: 21 mg/dL (ref 5–40)

## 2023-11-14 LAB — CMP14+EGFR
ALT: 11 IU/L (ref 0–32)
AST: 17 IU/L (ref 0–40)
Albumin: 4.3 g/dL (ref 3.9–4.9)
Alkaline Phosphatase: 97 IU/L (ref 44–121)
BUN/Creatinine Ratio: 14 (ref 12–28)
BUN: 9 mg/dL (ref 8–27)
Bilirubin Total: 0.4 mg/dL (ref 0.0–1.2)
CO2: 22 mmol/L (ref 20–29)
Calcium: 10.2 mg/dL (ref 8.7–10.3)
Chloride: 100 mmol/L (ref 96–106)
Creatinine, Ser: 0.64 mg/dL (ref 0.57–1.00)
Globulin, Total: 2.2 g/dL (ref 1.5–4.5)
Glucose: 93 mg/dL (ref 70–99)
Potassium: 4.7 mmol/L (ref 3.5–5.2)
Sodium: 138 mmol/L (ref 134–144)
Total Protein: 6.5 g/dL (ref 6.0–8.5)
eGFR: 99 mL/min/{1.73_m2} (ref >59)

## 2023-11-14 LAB — MICROALBUMIN / CREATININE URINE RATIO
Creatinine, Urine: 36.2 mg/dL
Microalb/Creat Ratio: <8 mg/g{creat} (ref 0–29)
Microalbumin, Urine: <3 ug/mL

## 2023-11-14 LAB — TSH: TSH: 4.61 u[IU]/mL — ABNORMAL HIGH (ref 0.450–4.500)

## 2023-12-02 ENCOUNTER — Telehealth: Payer: Self-pay | Admitting: Family

## 2023-12-06 DIAGNOSIS — G4733 Obstructive sleep apnea (adult) (pediatric): Secondary | ICD-10-CM | POA: Diagnosis not present

## 2023-12-08 ENCOUNTER — Other Ambulatory Visit (HOSPITAL_COMMUNITY): Payer: Self-pay

## 2023-12-23 ENCOUNTER — Other Ambulatory Visit: Payer: Self-pay

## 2023-12-23 ENCOUNTER — Other Ambulatory Visit (HOSPITAL_COMMUNITY): Payer: Self-pay

## 2024-01-02 ENCOUNTER — Other Ambulatory Visit (HOSPITAL_COMMUNITY): Payer: Self-pay

## 2024-01-04 ENCOUNTER — Other Ambulatory Visit: Payer: Self-pay

## 2024-01-11 ENCOUNTER — Telehealth: Admitting: Physician Assistant

## 2024-01-11 ENCOUNTER — Telehealth: Admitting: Family

## 2024-01-11 DIAGNOSIS — R3989 Other symptoms and signs involving the genitourinary system: Secondary | ICD-10-CM | POA: Diagnosis not present

## 2024-01-11 MED ORDER — CEPHALEXIN 500 MG PO CAPS: 500.0000 mg | ORAL_CAPSULE | Freq: Two times a day (BID) | ORAL | 0 refills | Status: AC

## 2024-01-11 NOTE — Progress Notes (Signed)
 Virtual Visit Consent   Brenda Meyer, you are scheduled for a virtual visit with a [facility] provider today. Just as with appointments in the office, your consent must be obtained to participate. Your consent will be active for this visit and any virtual visit you may have with one of our providers in the next 365 days. If you have a MyChart account, a copy of this consent can be sent to you electronically.  As this is a virtual visit, video technology does not allow for your provider to perform a traditional examination. This may limit your provider's ability to fully assess your condition. If your provider identifies any concerns that need to be evaluated in person or the need to arrange testing (such as labs, EKG, etc.), we will make arrangements to do so. Although advances in technology are sophisticated, we cannot ensure that it will always work on either your end or our end. If the connection with a video visit is poor, the visit may have to be switched to a telephone visit. With either a video or telephone visit, we are not always able to ensure that we have a secure connection.  By engaging in this virtual visit, you consent to the provision of healthcare and authorize for your insurance to be billed (if applicable) for the services provided during this visit. Depending on your insurance coverage, you may receive a charge related to this service.  I need to obtain your verbal consent now. Are you willing to proceed with your visit today? Karna P Gossard has provided verbal consent on 01/11/2024 for a virtual visit (video or telephone). Brenda Meyer, [place]  Date: 01/11/2024 9:10 AM   Virtual Visit via Video Note   I, Brenda Meyer, connected with  SHIRAZ HIERONYMUS  ([id number], [birth year]) on 01/11/24 at  9:15 AM EDT by a video-enabled telemedicine application and verified that I am speaking with the correct person using two identifiers.  Location: Patient: Virtual Visit Location  Patient: Home Provider: Virtual Visit Location Provider: Home Office   I discussed the limitations of evaluation and management by telemedicine and the availability of in person appointments. The patient expressed understanding and agreed to proceed.    History of Present Illness: Brenda Meyer is a 63 y.o. who identifies as a female who was assigned female at birth, and is being seen today for possible UTI.  Notes history of this and current issue seems similar. Notes dysuria, urgency/frequency starting last night with low-grade fever and chills. Symptoms starting last night. Denies hematuria or flank pain. Notes this is a typical presentation for her with UTI. Last UTI about 5-6 months ago.    HPI: HPI  Problems:  Patient Active Problem List   Diagnosis Date Noted   Emphysema with chronic bronchitis (HCC) 12/08/2022   Irritable bowel syndrome 05/21/2022   GAD (generalized anxiety disorder) 11/22/2021   Tear of meniscus of knee 04/09/2021   Female stress incontinence 04/09/2021   History of hysterectomy 04/09/2021   Constipation 11/01/2018   Hx of smoking 11/12/2016   Atherosclerosis of aorta (HCC) 11/12/2016   Depression, major, single episode, mild (HCC) 08/28/2016   Overweight (BMI 25.0-29.9) 07/29/2016   Essential hypertension 06/12/2015   Vitamin D  deficiency 12/11/2014   Back pain 12/08/2014   Hypothyroidism 12/08/2014   Hyperlipidemia 12/08/2014   Diabetes mellitus, type 2 (HCC) 12/08/2014   OSA (obstructive sleep apnea) 01/21/2011    Allergies:  Allergies  Allergen Reactions   Ace Inhibitors Swelling  and Cough   Medications:  Current Outpatient Medications:    cephALEXin  (KEFLEX ) 500 MG capsule, Take 1 capsule (500 mg total) by mouth 2 (two) times daily for 7 days., Disp: 14 capsule, Rfl: 0   aspirin  EC 81 MG tablet, Take 1 tablet (81 mg total) by mouth daily., Disp: 90 tablet, Rfl:    atorvastatin  (LIPITOR) 20 MG tablet, Take 1 tablet (20 mg total) by mouth  daily., Disp: 90 tablet, Rfl: 1   bisacodyl  (DULCOLAX) 5 MG EC tablet, Take 10 mg by mouth daily as needed for moderate constipation., Disp: , Rfl:    Calcium  Citrate-Vitamin D  (CALCIUM  + D PO), Take 600 mg by mouth 2 (two) times daily. , Disp: , Rfl:    cyclobenzaprine  (FLEXERIL ) 10 MG tablet, Take 1 tablet (10 mg total) by mouth at bedtime., Disp: 90 tablet, Rfl: 2   DULoxetine  (CYMBALTA ) 60 MG capsule, Take 1 capsule (60 mg total) by mouth daily., Disp: 90 capsule, Rfl: 0   levothyroxine  (SYNTHROID ) 50 MCG tablet, Take 1 tablet (50 mcg total) by mouth daily., Disp: 90 tablet, Rfl: 1   losartan  (COZAAR ) 100 MG tablet, Take 1 tablet (100 mg total) by mouth daily., Disp: 90 tablet, Rfl: 1   meloxicam  (MOBIC ) 15 MG tablet, Take 1 tablet (15 mg total) by mouth daily., Disp: 90 tablet, Rfl: 0   metFORMIN  (GLUCOPHAGE ) 1000 MG tablet, Take 1 tablet (1,000 mg total) by mouth 2 (two) times daily with a meal. (NEEDS TO BE SEEN BEFORE NEXT REFILL), Disp: 180 tablet, Rfl: 0   pantoprazole  (PROTONIX ) 20 MG tablet, Take 1 tablet (20 mg total) by mouth daily., Disp: 90 tablet, Rfl: 0   Semaglutide ,0.25 or 0.5MG /DOS, (OZEMPIC , 0.25 OR 0.5 MG/DOSE,) 2 MG/3ML SOPN, Inject 0.5 mg into the skin once a week., Disp: 9 mL, Rfl: 0   Vitamin D , Ergocalciferol , (DRISDOL ) 1.25 MG (50000 UNIT) CAPS capsule, Take 1 capsule by mouth every 7 days. (NEEDS TO BE SEEN BEFORE NEXT REFILL), Disp: 12 capsule, Rfl: 1  Observations/Objective: Patient is well-developed, well-nourished in no acute distress.  Resting comfortably at home.  Head is normocephalic, atraumatic.  No labored breathing. Speech is clear and coherent with logical content.  Patient is alert and oriented at baseline.   Assessment and Plan: 1. Suspected UTI (Primary) - cephALEXin  (KEFLEX ) 500 MG capsule; Take 1 capsule (500 mg total) by mouth 2 (two) times daily for 7 days.  Dispense: 14 capsule; Refill: 0  Classic UTI symptoms with absence of alarm signs or  symptoms. Prior history of UTI. Will treat empirically with Keflex  for suspected cystitis. Supportive measures and OTC medications reviewed. Strict in-person evaluation precautions discussed.    Follow Up Instructions: I discussed the assessment and treatment plan with the patient. The patient was provided an opportunity to ask questions and all were answered. The patient agreed with the plan and demonstrated an understanding of the instructions.  A copy of instructions were sent to the patient via MyChart unless otherwise noted below.   The patient was advised to call back or seek an in-person evaluation if the symptoms worsen or if the condition fails to improve as anticipated.    Brenda Maillard, PA-C

## 2024-01-11 NOTE — Patient Instructions (Signed)
 Maridee Shoemaker, thank you for joining Hyla Maillard, PA-C for today's virtual visit.  While this provider is not your primary care provider (PCP), if your PCP is located in our provider database this encounter information will be shared with them immediately following your visit.   A [facility] MyChart account gives you access to today's visit and all your visits, tests, and labs performed at [facility] " click here if you don't have a [facility] MyChart account or go to [email or web address]  Consent: (Patient) Latrenda P Vanhoesen provided verbal consent for this virtual visit at the beginning of the encounter.  Current Medications:  Current Outpatient Medications:    aspirin  EC 81 MG tablet, Take 1 tablet (81 mg total) by mouth daily., Disp: 90 tablet, Rfl:    atorvastatin  (LIPITOR) 20 MG tablet, Take 1 tablet (20 mg total) by mouth daily., Disp: 90 tablet, Rfl: 1   bisacodyl  (DULCOLAX) 5 MG EC tablet, Take 10 mg by mouth daily as needed for moderate constipation., Disp: , Rfl:    Calcium  Citrate-Vitamin D  (CALCIUM  + D PO), Take 600 mg by mouth 2 (two) times daily. , Disp: , Rfl:    cyclobenzaprine  (FLEXERIL ) 10 MG tablet, Take 1 tablet (10 mg total) by mouth at bedtime., Disp: 90 tablet, Rfl: 2   DULoxetine  (CYMBALTA ) 60 MG capsule, Take 1 capsule (60 mg total) by mouth daily., Disp: 90 capsule, Rfl: 0   levothyroxine  (SYNTHROID ) 50 MCG tablet, Take 1 tablet (50 mcg total) by mouth daily., Disp: 90 tablet, Rfl: 1   losartan  (COZAAR ) 100 MG tablet, Take 1 tablet (100 mg total) by mouth daily., Disp: 90 tablet, Rfl: 1   meloxicam  (MOBIC ) 15 MG tablet, Take 1 tablet (15 mg total) by mouth daily., Disp: 90 tablet, Rfl: 0   metFORMIN  (GLUCOPHAGE ) 1000 MG tablet, Take 1 tablet (1,000 mg total) by mouth 2 (two) times daily with a meal. (NEEDS TO BE SEEN BEFORE NEXT REFILL), Disp: 180 tablet, Rfl: 0   pantoprazole  (PROTONIX ) 20 MG tablet, Take 1 tablet (20 mg total) by mouth  daily., Disp: 90 tablet, Rfl: 0   Semaglutide ,0.25 or 0.5MG /DOS, (OZEMPIC , 0.25 OR 0.5 MG/DOSE,) 2 MG/3ML SOPN, Inject 0.5 mg into the skin once a week., Disp: 9 mL, Rfl: 0   Vitamin D , Ergocalciferol , (DRISDOL ) 1.25 MG (50000 UNIT) CAPS capsule, Take 1 capsule by mouth every 7 days. (NEEDS TO BE SEEN BEFORE NEXT REFILL), Disp: 12 capsule, Rfl: 1   Medications ordered in this encounter:  No orders of the defined types were placed in this encounter.    *If you need refills on other medications prior to your next appointment, please contact your pharmacy*  Follow-Up: Call back or seek an in-person evaluation if the symptoms worsen or if the condition fails to improve as anticipated.   Virtual Care [phone]  Other Instructions Your symptoms are consistent with a bladder infection, also called acute cystitis. Please take your antibiotic (Keflex ) as directed until all pills are gone.  Stay very well hydrated.  Consider a daily probiotic (Align, Culturelle, or Activia) to help prevent stomach upset caused by the antibiotic.  Taking a probiotic daily may also help prevent recurrent UTIs.  Also consider taking AZO (Phenazopyridine) tablets to help decrease pain with urination.     Urinary Tract Infection A urinary tract infection (UTI) can occur any place along the urinary tract. The tract includes the kidneys, ureters, bladder, and urethra. A type of germ called bacteria often  causes a UTI. UTIs are often helped with antibiotic medicine.  HOME CARE  If given, take antibiotics as told by your doctor. Finish them even if you start to feel better. Drink enough fluids to keep your pee (urine) clear or pale yellow. Avoid tea, drinks with caffeine, and bubbly (carbonated) drinks. Pee often. Avoid holding your pee in for a long time. Pee before and after having sex (intercourse). Wipe from front to back after you poop (bowel movement) if you are a woman. Use each tissue only once. GET  HELP RIGHT AWAY IF:  You have back pain. You have lower belly (abdominal) pain. You have chills. You feel sick to your stomach (nauseous). You throw up (vomit). Your burning or discomfort with peeing does not go away. You have a fever. Your symptoms are not better in 3 days. MAKE SURE YOU:  Understand these instructions. Will watch your condition. Will get help right away if you are not doing well or get worse. Document Released: 02/04/2008 Document Revised: 05/12/2012 Document Reviewed: 03/18/2012 [facility] Patient Information 2015 [place], [place]. This information is not intended to replace advice given to you by your health care provider. Make sure you discuss any questions you have with your health care provider.    If you have been instructed to have an in-person evaluation today at a local Urgent Care facility, please use the link below. It will take you to a list of all of our available [facility], including address, phone number and hours of operation. Please do not delay care.  [facility]  If you or a family member do not have a primary care provider, use the link below to schedule a visit and establish care. When you choose a [facility] primary care physician or advanced practice provider, you gain a long-term partner in health. Find a Primary Care Provider  Learn more about [facility]'s in-office and virtual care options:  - Get Care Now

## 2024-02-05 ENCOUNTER — Telehealth: Payer: Self-pay | Admitting: Family Medicine

## 2024-02-05 NOTE — Telephone Encounter (Signed)
 Brenda Meyer

## 2024-02-13 ENCOUNTER — Other Ambulatory Visit: Payer: Self-pay | Admitting: Family

## 2024-02-13 ENCOUNTER — Other Ambulatory Visit (HOSPITAL_BASED_OUTPATIENT_CLINIC_OR_DEPARTMENT_OTHER): Payer: Self-pay

## 2024-02-13 ENCOUNTER — Other Ambulatory Visit (HOSPITAL_COMMUNITY): Payer: Self-pay

## 2024-02-13 DIAGNOSIS — F320 Major depressive disorder, single episode, mild: Secondary | ICD-10-CM

## 2024-02-13 DIAGNOSIS — F411 Generalized anxiety disorder: Secondary | ICD-10-CM

## 2024-02-13 DIAGNOSIS — E119 Type 2 diabetes mellitus without complications: Secondary | ICD-10-CM

## 2024-02-15 ENCOUNTER — Other Ambulatory Visit (HOSPITAL_COMMUNITY): Payer: Self-pay

## 2024-02-15 ENCOUNTER — Other Ambulatory Visit: Payer: Self-pay

## 2024-02-15 MED ORDER — DULOXETINE HCL 60 MG PO CPEP
60.0000 mg | ORAL_CAPSULE | Freq: Every day | ORAL | 0 refills | Status: DC
  Filled 2024-02-15: qty 90, 90d supply, fill #0

## 2024-02-15 MED ORDER — METFORMIN HCL 1000 MG PO TABS
1000.0000 mg | ORAL_TABLET | Freq: Two times a day (BID) | ORAL | 0 refills | Status: DC
  Filled 2024-02-15: qty 180, 90d supply, fill #0

## 2024-02-29 ENCOUNTER — Encounter: Payer: Self-pay | Admitting: Family

## 2024-02-29 ENCOUNTER — Telehealth (INDEPENDENT_AMBULATORY_CARE_PROVIDER_SITE_OTHER): Admitting: Family

## 2024-02-29 DIAGNOSIS — R399 Unspecified symptoms and signs involving the genitourinary system: Secondary | ICD-10-CM | POA: Diagnosis not present

## 2024-02-29 MED ORDER — SULFAMETHOXAZOLE-TRIMETHOPRIM 800-160 MG PO TABS
1.0000 | ORAL_TABLET | Freq: Two times a day (BID) | ORAL | 0 refills | Status: DC
Start: 2024-02-29 — End: 2024-05-20

## 2024-02-29 NOTE — Progress Notes (Signed)
 Virtual Visit Consent   Brenda Meyer, you are scheduled for a virtual visit with a [facility] provider today. Just as with appointments in the office, your consent must be obtained to participate. Your consent will be active for this visit and any virtual visit you may have with one of our providers in the next 365 days. If you have a MyChart account, a copy of this consent can be sent to you electronically.  As this is a virtual visit, video technology does not allow for your provider to perform a traditional examination. This may limit your provider's ability to fully assess your condition. If your provider identifies any concerns that need to be evaluated in person or the need to arrange testing (such as labs, EKG, etc.), we will make arrangements to do so. Although advances in technology are sophisticated, we cannot ensure that it will always work on either your end or our end. If the connection with a video visit is poor, the visit may have to be switched to a telephone visit. With either a video or telephone visit, we are not always able to ensure that we have a secure connection.  By engaging in this virtual visit, you consent to the provision of healthcare and authorize for your insurance to be billed (if applicable) for the services provided during this visit. Depending on your insurance coverage, you may receive a charge related to this service.  I need to obtain your verbal consent now. Are you willing to proceed with your visit today? Brenda Meyer has provided verbal consent on 02/29/2024 for a virtual visit (video or telephone). Bari Learn, FNP  Date: 02/29/2024 12:51 PM   Virtual Visit via Video Note   I, Bari Learn, connected with  Brenda Meyer  ([id number], 06-25-1961) on 02/29/24 at  8:40 AM EDT by a video-enabled telemedicine application and verified that I am speaking with the correct person using two identifiers.  Location: Patient: Virtual Visit Location Patient:  Other: work Provider: Pharmacist, community: Home Office   I discussed the limitations of evaluation and management by telemedicine and the availability of in person appointments. The patient expressed understanding and agreed to proceed.    History of Present Illness: Brenda Meyer is a 63 y.o. who identifies as a female who was assigned female at birth, and is being seen today for UTI symptoms that started this AM.  HPI: Dysuria  This is a new problem. The current episode started in the past 7 days. The problem occurs every urination. The problem has been gradually worsening. The quality of the pain is described as burning. The pain is at a severity of 8/10. The pain is moderate. Maximum temperature: low grade. Associated symptoms include frequency, hesitancy and urgency. Pertinent negatives include no hematuria, nausea or vomiting. She has tried increased fluids for the symptoms. The treatment provided mild relief.    Problems:  Patient Active Problem List   Diagnosis Date Noted   Emphysema with chronic bronchitis (HCC) 12/08/2022   Irritable bowel syndrome 05/21/2022   GAD (generalized anxiety disorder) 11/22/2021   Tear of meniscus of knee 04/09/2021   Female stress incontinence 04/09/2021   History of hysterectomy 04/09/2021   Constipation 11/01/2018   Hx of smoking 11/12/2016   Atherosclerosis of aorta (HCC) 11/12/2016   Depression, major, single episode, mild (HCC) 08/28/2016   Overweight (BMI 25.0-29.9) 07/29/2016   Essential hypertension 06/12/2015   Vitamin D  deficiency 12/11/2014   Back pain 12/08/2014  Hypothyroidism 12/08/2014   Hyperlipidemia 12/08/2014   Diabetes mellitus, type 2 (HCC) 12/08/2014   OSA (obstructive sleep apnea) 01/21/2011    Allergies:  Allergies  Allergen Reactions   Ace Inhibitors Swelling and Cough   Medications:  Current Outpatient Medications:    sulfamethoxazole-trimethoprim (BACTRIM DS) 800-160 MG tablet, Take 1 tablet by  mouth 2 (two) times daily., Disp: 14 tablet, Rfl: 0   aspirin  EC 81 MG tablet, Take 1 tablet (81 mg total) by mouth daily., Disp: 90 tablet, Rfl:    atorvastatin  (LIPITOR) 20 MG tablet, Take 1 tablet (20 mg total) by mouth daily., Disp: 90 tablet, Rfl: 1   bisacodyl  (DULCOLAX) 5 MG EC tablet, Take 10 mg by mouth daily as needed for moderate constipation., Disp: , Rfl:    Calcium  Citrate-Vitamin D  (CALCIUM  + D PO), Take 600 mg by mouth 2 (two) times daily. , Disp: , Rfl:    cyclobenzaprine  (FLEXERIL ) 10 MG tablet, Take 1 tablet (10 mg total) by mouth at bedtime., Disp: 90 tablet, Rfl: 2   DULoxetine  (CYMBALTA ) 60 MG capsule, Take 1 capsule (60 mg total) by mouth daily., Disp: 90 capsule, Rfl: 0   levothyroxine  (SYNTHROID ) 50 MCG tablet, Take 1 tablet (50 mcg total) by mouth daily., Disp: 90 tablet, Rfl: 1   losartan  (COZAAR ) 100 MG tablet, Take 1 tablet (100 mg total) by mouth daily., Disp: 90 tablet, Rfl: 1   meloxicam  (MOBIC ) 15 MG tablet, Take 1 tablet (15 mg total) by mouth daily., Disp: 90 tablet, Rfl: 0   metFORMIN  (GLUCOPHAGE ) 1000 MG tablet, Take 1 tablet (1,000 mg total) by mouth 2 (two) times daily with a meal. (NEEDS TO BE SEEN BEFORE NEXT REFILL), Disp: 180 tablet, Rfl: 0   pantoprazole  (PROTONIX ) 20 MG tablet, Take 1 tablet (20 mg total) by mouth daily., Disp: 90 tablet, Rfl: 0   Semaglutide ,0.25 or 0.5MG /DOS, (OZEMPIC , 0.25 OR 0.5 MG/DOSE,) 2 MG/3ML SOPN, Inject 0.5 mg into the skin once a week., Disp: 9 mL, Rfl: 0   Vitamin D , Ergocalciferol , (DRISDOL ) 1.25 MG (50000 UNIT) CAPS capsule, Take 1 capsule by mouth every 7 days. (NEEDS TO BE SEEN BEFORE NEXT REFILL), Disp: 12 capsule, Rfl: 1  Observations/Objective: Patient is well-developed, well-nourished in no acute distress.  Resting comfortably .  Head is normocephalic, atraumatic.  No labored breathing.  Speech is clear and coherent with logical content.  Patient is alert and oriented at baseline.    Assessment and Plan: 1.  UTI symptoms (Primary) - sulfamethoxazole-trimethoprim (BACTRIM DS) 800-160 MG tablet; Take 1 tablet by mouth 2 (two) times daily.  Dispense: 14 tablet; Refill: 0  Start bactrim  Force fluids AZO over the counter X2 days Follow up if symptoms worsen or do not improve   Follow Up Instructions: I discussed the assessment and treatment plan with the patient. The patient was provided an opportunity to ask questions and all were answered. The patient agreed with the plan and demonstrated an understanding of the instructions.  A copy of instructions were sent to the patient via MyChart unless otherwise noted below.     The patient was advised to call back or seek an in-person evaluation if the symptoms worsen or if the condition fails to improve as anticipated.    Bari Learn, FNP

## 2024-03-02 ENCOUNTER — Other Ambulatory Visit (HOSPITAL_COMMUNITY): Payer: Self-pay

## 2024-03-02 DIAGNOSIS — Z122 Encounter for screening for malignant neoplasm of respiratory organs: Secondary | ICD-10-CM

## 2024-03-16 ENCOUNTER — Telehealth: Payer: Self-pay | Admitting: Acute Care

## 2024-03-16 ENCOUNTER — Telehealth: Payer: Self-pay

## 2024-03-16 DIAGNOSIS — Z122 Encounter for screening for malignant neoplasm of respiratory organs: Secondary | ICD-10-CM

## 2024-03-16 DIAGNOSIS — Z87891 Personal history of nicotine dependence: Secondary | ICD-10-CM

## 2024-03-16 NOTE — Telephone Encounter (Signed)
 Called and spoke to pt. Patient scheduled for SDMV and LDCT.

## 2024-03-16 NOTE — Telephone Encounter (Signed)
 Patient called to schedule lung cancer screening forwarding to lung cancer screening girls to schedule scan

## 2024-03-16 NOTE — Telephone Encounter (Signed)
 Lung Cancer Screening Narrative/Criteria Questionnaire (Cigarette Smokers Only- No Cigars/Pipes/vapes)   Brenda Meyer   SDMV:03/23/2024 11:30 Brenda Meyer    11-25-1960   LDCT: 04/01/2024 8:00 DWB    63 y.o.   Phone: [phone]  Lung Screening Narrative (confirm age 34-77 yrs Medicare / 50-80 yrs Private pay insurance)   Insurance information:[employer] employee [employer] - focus plan   Referring Provider:Christy Meyer PCP   This screening involves an initial phone call with a team member from our program. It is called a shared decision making visit. The initial meeting is required by  insurance and Medicare to make sure you understand the program. This appointment takes about 15-20 minutes to complete. You will complete the screening scan at your scheduled date/time.  This scan takes about 5-10 minutes to complete. You can eat and drink normally before and after the scan.  Criteria questions for Lung Cancer Screening:   Are you a current or former smoker? Former Age began smoking: 63yo   If you are a former smoker, what year did you quit smoking? 2011(within 15 yrs)   To calculate your smoking history, I need an accurate estimate of how many packs of cigarettes you smoked per day and for how many years. (Not just the number of PPD you are now smoking)   Years smoking 34 x Packs per day 1 = Pack years 34   (at least 20 pack yrs)   (Make sure they understand that we need to know how much they have smoked in the past, not just the number of PPD they are smoking now)  Do you have a personal history of cancer?  No    Do you have a family history of cancer? Yes  (cancer type and and relative) sister - lung  Are you coughing up blood?  No  Have you had unexplained weight loss of 15 lbs or more in the last 6 months? No  It looks like you meet all criteria.  When would be a good time for us  to schedule you for this screening?   Additional information: N/A

## 2024-03-21 ENCOUNTER — Encounter (INDEPENDENT_AMBULATORY_CARE_PROVIDER_SITE_OTHER): Payer: Self-pay

## 2024-03-21 DIAGNOSIS — K120 Recurrent oral aphthae: Secondary | ICD-10-CM | POA: Diagnosis not present

## 2024-03-22 ENCOUNTER — Other Ambulatory Visit: Payer: Self-pay | Admitting: Family

## 2024-03-22 ENCOUNTER — Other Ambulatory Visit (HOSPITAL_COMMUNITY): Payer: Self-pay

## 2024-03-22 ENCOUNTER — Other Ambulatory Visit: Payer: Self-pay

## 2024-03-22 DIAGNOSIS — E119 Type 2 diabetes mellitus without complications: Secondary | ICD-10-CM

## 2024-03-22 MED ORDER — OZEMPIC (0.25 OR 0.5 MG/DOSE) 2 MG/3ML ~~LOC~~ SOPN
0.5000 mg | PEN_INJECTOR | SUBCUTANEOUS | 0 refills | Status: DC
  Filled 2024-03-22 – 2024-03-24 (×2): qty 9, 84d supply, fill #0

## 2024-03-22 MED ORDER — TRIAMCINOLONE ACETONIDE 0.1 % MT PSTE
1.0000 | PASTE | Freq: Two times a day (BID) | OROMUCOSAL | 12 refills | Status: DC
  Filled 2024-03-22: qty 5, 3d supply, fill #0

## 2024-03-22 NOTE — Addendum Note (Signed)
 Addended by: LAVELL LYE A on: 03/22/2024 01:13 PM   Modules accepted: Orders

## 2024-03-22 NOTE — Telephone Encounter (Signed)
 Hello, I sounds like a canker sore. I have sent you a prescription steroid cream you can put on the ulcer. Let me know if you do not feel better!  Bari Learn, FNP  Approximately 5 minutes was spent documenting and reviewing patient's chart.

## 2024-03-23 ENCOUNTER — Ambulatory Visit: Admitting: Adult Health

## 2024-03-23 ENCOUNTER — Ambulatory Visit

## 2024-03-23 ENCOUNTER — Encounter: Payer: Self-pay | Admitting: Adult Health

## 2024-03-23 DIAGNOSIS — Z87891 Personal history of nicotine dependence: Secondary | ICD-10-CM | POA: Diagnosis not present

## 2024-03-23 NOTE — Progress Notes (Signed)
  Virtual Visit via Telephone Note  I connected with Brenda Meyer , 03/23/24 11:57 AM by a telemedicine application and verified that I am speaking with the correct person using two identifiers.  Location: Patient: home Provider: home   I discussed the limitations of evaluation and management by telemedicine and the availability of in person appointments. The patient expressed understanding and agreed to proceed.   Shared Decision Making Visit Lung Cancer Screening Program [phone])   Eligibility: 63 y.o. Pack Years Smoking History Calculation =34 pack years  (# packs/per year x # years smoked) Recent History of coughing up blood  no Unexplained weight loss? no ( >Than 15 pounds within the last 6 months ) Prior History Lung / other cancer no (Diagnosis within the last 5 years already requiring surveillance chest CT Scans). Smoking Status Former Smoker Former Smokers: Years since quit: 14 years  Quit Date: 2011  Visit Components: Discussion included one or more decision making aids. YES Discussion included risk/benefits of screening. YES Discussion included potential follow up diagnostic testing for abnormal scans. YES Discussion included meaning and risk of over diagnosis. YES Discussion included meaning and risk of False Positives. YES Discussion included meaning of total radiation exposure. YES  Counseling Included: Importance of adherence to annual lung cancer LDCT screening. YES Impact of comorbidities on ability to participate in the program. YES Ability and willingness to under diagnostic treatment. YES  Smoking Cessation Counseling: Former Smokers:  Discussed the importance of maintaining cigarette abstinence. yes Diagnosis Code: Personal History of Nicotine Dependence. S12.108 Information about tobacco cessation classes and interventions provided to patient. Yes Patient provided with ticket for LDCT Scan. yes Written Order for Lung Cancer Screening with LDCT  placed in Epic. Yes (CT Chest Lung Cancer Screening Low Dose W/O CM) [id number]  Z12.2-Screening of respiratory organs Z87.891-Personal history of nicotine dependence   [name] 03/23/24

## 2024-03-23 NOTE — Patient Instructions (Signed)

## 2024-03-24 ENCOUNTER — Encounter: Payer: Self-pay | Admitting: Family Medicine

## 2024-03-24 ENCOUNTER — Other Ambulatory Visit: Payer: Self-pay

## 2024-03-24 ENCOUNTER — Telehealth: Admitting: Family Medicine

## 2024-03-24 DIAGNOSIS — Z87891 Personal history of nicotine dependence: Secondary | ICD-10-CM

## 2024-03-24 DIAGNOSIS — K1370 Unspecified lesions of oral mucosa: Secondary | ICD-10-CM

## 2024-03-24 DIAGNOSIS — K1379 Other lesions of oral mucosa: Secondary | ICD-10-CM

## 2024-03-24 NOTE — Progress Notes (Signed)
 Virtual Visit via Video   I connected with patient on 03/24/24 at 1355 by a video enabled telemedicine application and verified that I am speaking with the correct person using two identifiers.  Location patient: Home Location provider: [facility] Persons participating in the virtual visit: Patient and Provider  I discussed the limitations of evaluation and management by telemedicine and the availability of in person appointments. The patient expressed understanding and agreed to proceed.  Subjective:   HPI:  Pt presents today for  Chief Complaint  Patient presents with   Mouth Lesions   Discussed the use of AI scribe software for clinical note transcription with the patient, who gave verbal consent to proceed.  Brenda Meyer is a 63 year old female who presents with a painful white patch on her gum.  She has had a gum-sized white patch on her lower gum for about three weeks. The patch is about the size of a pencil and is located near the frenulum. It is extremely painful, and she states, 'I can hardly stand it.'  She has been using viscous xylocaine , nystatin , and Kenalog  as prescribed earlier this week, but these treatments have not alleviated the pain or changed the appearance of the patch. She also tried an over-the-counter antiviral without improvement.  She was on Bactrim  in early July for a urinary tract infection. No open center or bump on the white patch, describing it as a solid white patch.       ROS per HPI  Patient Active Problem List   Diagnosis Date Noted   Emphysema with chronic bronchitis (HCC) 12/08/2022   Irritable bowel syndrome 05/21/2022   GAD (generalized anxiety disorder) 11/22/2021   Tear of meniscus of knee 04/09/2021   Female stress incontinence 04/09/2021   History of hysterectomy 04/09/2021   Constipation 11/01/2018   Hx of smoking 11/12/2016   Atherosclerosis of aorta (HCC) 11/12/2016   Depression, major,  single episode, mild (HCC) 08/28/2016   Overweight (BMI 25.0-29.9) 07/29/2016   Essential hypertension 06/12/2015   Vitamin D  deficiency 12/11/2014   Back pain 12/08/2014   Hypothyroidism 12/08/2014   Hyperlipidemia 12/08/2014   Diabetes mellitus, type 2 (HCC) 12/08/2014   OSA (obstructive sleep apnea) 01/21/2011    Social History   Tobacco Use   Smoking status: Former    Current packs/day: 0.00    Average packs/day: 1 pack/day for 30.0 years (30.0 ttl pk-yrs)    Types: Cigarettes    Start date: 10/02/1980    Quit date: 10/02/2010    Years since quitting: 13.4   Smokeless tobacco: Never  Substance Use Topics   Alcohol use: No    Current Outpatient Medications:    aspirin  EC 81 MG tablet, Take 1 tablet (81 mg total) by mouth daily., Disp: 90 tablet, Rfl:    atorvastatin  (LIPITOR) 20 MG tablet, Take 1 tablet (20 mg total) by mouth daily., Disp: 90 tablet, Rfl: 1   bisacodyl  (DULCOLAX) 5 MG EC tablet, Take 10 mg by mouth daily as needed for moderate constipation., Disp: , Rfl:    Calcium  Citrate-Vitamin D  (CALCIUM  + D PO), Take 600 mg by mouth 2 (two) times daily. , Disp: , Rfl:    cyclobenzaprine  (FLEXERIL ) 10 MG tablet, Take 1 tablet (10 mg total) by mouth at bedtime., Disp: 90 tablet, Rfl: 2   DULoxetine  (CYMBALTA ) 60 MG capsule, Take 1 capsule (60 mg total) by mouth daily., Disp: 90 capsule, Rfl: 0   levothyroxine  (SYNTHROID ) 50 MCG  tablet, Take 1 tablet (50 mcg total) by mouth daily., Disp: 90 tablet, Rfl: 1   losartan  (COZAAR ) 100 MG tablet, Take 1 tablet (100 mg total) by mouth daily., Disp: 90 tablet, Rfl: 1   meloxicam  (MOBIC ) 15 MG tablet, Take 1 tablet (15 mg total) by mouth daily., Disp: 90 tablet, Rfl: 0   metFORMIN  (GLUCOPHAGE ) 1000 MG tablet, Take 1 tablet (1,000 mg total) by mouth 2 (two) times daily with a meal. (NEEDS TO BE SEEN BEFORE NEXT REFILL), Disp: 180 tablet, Rfl: 0   pantoprazole  (PROTONIX ) 20 MG tablet, Take 1 tablet (20 mg total) by mouth daily., Disp: 90  tablet, Rfl: 0   Semaglutide ,0.25 or 0.5MG /DOS, (OZEMPIC , 0.25 OR 0.5 MG/DOSE,) 2 MG/3ML SOPN, Inject 0.5 mg into the skin once a week., Disp: 9 mL, Rfl: 0   sulfamethoxazole -trimethoprim  (BACTRIM  DS) 800-160 MG tablet, Take 1 tablet by mouth 2 (two) times daily., Disp: 14 tablet, Rfl: 0   triamcinolone  (KENALOG ) 0.1 % paste, Use as directed 1 Application in the mouth or throat 2 (two) times daily., Disp: 5 g, Rfl: 12   Vitamin D , Ergocalciferol , (DRISDOL ) 1.25 MG (50000 UNIT) CAPS capsule, Take 1 capsule by mouth every 7 days. (NEEDS TO BE SEEN BEFORE NEXT REFILL), Disp: 12 capsule, Rfl: 1  Allergies  Allergen Reactions   Ace Inhibitors Swelling and Cough    Objective:   There were no vitals taken for this visit.  Patient is well-developed, well-nourished in no acute distress.  Resting comfortably at home.  Head is normocephalic, atraumatic.  No labored breathing.  Speech is clear and coherent with logical content.  Patient is alert and oriented at baseline.  Unable to visualize lesion on video.   Assessment and Plan:   Brenda Meyer was seen today for mouth lesions.  Diagnoses and all orders for this visit:  Oral lesion -     Ambulatory referral to ENT -     Ambulatory referral to Oral Maxillofacial Surgery  Oral pain -     Ambulatory referral to ENT -     Ambulatory referral to Oral Maxillofacial Surgery     Oral white patch Persistent painful white patch on the gum near the frenulum for three weeks, unresponsive to viscous xylocaine , nystatin , Kenalog , or over-the-counter antiviral treatment. The lesion's persistence and lack of response to treatment necessitate further evaluation. - Recommend Canker Ease to form a protective barrier and alleviate pain. - Refer to ENT or oral surgeon for further evaluation, including possible biopsy or scraping.    Return if symptoms worsen or fail to improve.  Brenda Bruns, FNP-C [facility] [address] [place], [place] [address] [phone]  03/24/2024  Time spent with the patient: 12 minutes, of which >50% was spent in obtaining information about symptoms, reviewing previous labs, evaluations, and treatments, counseling about condition (please see the discussed topics above), and developing a plan to further investigate it; had a number of questions which I addressed.

## 2024-04-01 ENCOUNTER — Ambulatory Visit (HOSPITAL_BASED_OUTPATIENT_CLINIC_OR_DEPARTMENT_OTHER)
Admission: RE | Admit: 2024-04-01 | Discharge: 2024-04-01 | Disposition: A | Discharge status: Home / Self Care | Source: Ambulatory Visit | Attending: Acute Care | Admitting: Acute Care

## 2024-04-01 DIAGNOSIS — Z87891 Personal history of nicotine dependence: Secondary | ICD-10-CM | POA: Diagnosis not present

## 2024-04-01 DIAGNOSIS — Z122 Encounter for screening for malignant neoplasm of respiratory organs: Secondary | ICD-10-CM | POA: Insufficient documentation

## 2024-04-14 ENCOUNTER — Other Ambulatory Visit: Payer: Self-pay | Admitting: Acute Care

## 2024-04-14 DIAGNOSIS — Z122 Encounter for screening for malignant neoplasm of respiratory organs: Secondary | ICD-10-CM

## 2024-04-14 DIAGNOSIS — Z87891 Personal history of nicotine dependence: Secondary | ICD-10-CM

## 2024-05-04 DIAGNOSIS — Z0279 Encounter for issue of other medical certificate: Secondary | ICD-10-CM

## 2024-05-04 NOTE — Telephone Encounter (Signed)
 [employer] faxed FMLA forms to be completed  Form Fee Paid? (Y/N)       yes     If NO, form is placed on front office manager desk to hold until payment received. If YES, then form will be placed in the RX/HH Nurse Coordinators box for completion.  Form will not be processed until payment is received

## 2024-05-06 DIAGNOSIS — J4489 Other specified chronic obstructive pulmonary disease: Secondary | ICD-10-CM

## 2024-05-06 DIAGNOSIS — I10 Essential (primary) hypertension: Secondary | ICD-10-CM

## 2024-05-06 MED ORDER — COVID-19 MRNA VAC-TRIS(PFIZER) 30 MCG/0.3ML IM SUSY: 0.3000 mL | PREFILLED_SYRINGE | Freq: Once | INTRAMUSCULAR | 0 refills | Status: AC

## 2024-05-10 ENCOUNTER — Encounter (INDEPENDENT_AMBULATORY_CARE_PROVIDER_SITE_OTHER): Payer: Self-pay

## 2024-05-19 ENCOUNTER — Other Ambulatory Visit: Payer: Self-pay | Admitting: Family

## 2024-05-19 ENCOUNTER — Other Ambulatory Visit (HOSPITAL_COMMUNITY): Payer: Self-pay

## 2024-05-19 ENCOUNTER — Other Ambulatory Visit: Payer: Self-pay

## 2024-05-19 DIAGNOSIS — E559 Vitamin D deficiency, unspecified: Secondary | ICD-10-CM

## 2024-05-19 DIAGNOSIS — E119 Type 2 diabetes mellitus without complications: Secondary | ICD-10-CM

## 2024-05-19 MED ORDER — VITAMIN D (ERGOCALCIFEROL) 1.25 MG (50000 UNIT) PO CAPS
50000.0000 [IU] | ORAL_CAPSULE | ORAL | 2 refills | Status: AC
  Filled 2024-05-19: qty 12, 84d supply, fill #0
  Filled 2024-08-07: qty 12, 84d supply, fill #1

## 2024-05-19 MED ORDER — PANTOPRAZOLE SODIUM 20 MG PO TBEC
20.0000 mg | DELAYED_RELEASE_TABLET | Freq: Every day | ORAL | 0 refills | Status: DC
  Filled 2024-05-19: qty 90, 90d supply, fill #0

## 2024-05-19 MED ORDER — METFORMIN HCL 1000 MG PO TABS
1000.0000 mg | ORAL_TABLET | Freq: Two times a day (BID) | ORAL | 0 refills | Status: DC
  Filled 2024-05-19: qty 180, 90d supply, fill #0

## 2024-05-20 ENCOUNTER — Other Ambulatory Visit: Payer: Self-pay

## 2024-05-20 ENCOUNTER — Encounter: Payer: Self-pay | Admitting: Family

## 2024-05-20 ENCOUNTER — Ambulatory Visit: Admitting: Family

## 2024-05-20 ENCOUNTER — Other Ambulatory Visit (HOSPITAL_COMMUNITY): Payer: Self-pay

## 2024-05-20 VITALS — BP 137/75 | HR 71 | Temp 97.8°F | Ht 65.0 in | Wt 172.4 lb

## 2024-05-20 DIAGNOSIS — E039 Hypothyroidism, unspecified: Secondary | ICD-10-CM | POA: Diagnosis not present

## 2024-05-20 DIAGNOSIS — E559 Vitamin D deficiency, unspecified: Secondary | ICD-10-CM | POA: Diagnosis not present

## 2024-05-20 DIAGNOSIS — G4733 Obstructive sleep apnea (adult) (pediatric): Secondary | ICD-10-CM | POA: Diagnosis not present

## 2024-05-20 DIAGNOSIS — E663 Overweight: Secondary | ICD-10-CM | POA: Diagnosis not present

## 2024-05-20 DIAGNOSIS — M546 Pain in thoracic spine: Secondary | ICD-10-CM | POA: Diagnosis not present

## 2024-05-20 DIAGNOSIS — J4489 Other specified chronic obstructive pulmonary disease: Secondary | ICD-10-CM

## 2024-05-20 DIAGNOSIS — G8929 Other chronic pain: Secondary | ICD-10-CM

## 2024-05-20 DIAGNOSIS — K5904 Chronic idiopathic constipation: Secondary | ICD-10-CM

## 2024-05-20 DIAGNOSIS — E1169 Type 2 diabetes mellitus with other specified complication: Secondary | ICD-10-CM | POA: Diagnosis not present

## 2024-05-20 DIAGNOSIS — E785 Hyperlipidemia, unspecified: Secondary | ICD-10-CM | POA: Diagnosis not present

## 2024-05-20 DIAGNOSIS — F320 Major depressive disorder, single episode, mild: Secondary | ICD-10-CM | POA: Diagnosis not present

## 2024-05-20 DIAGNOSIS — F411 Generalized anxiety disorder: Secondary | ICD-10-CM

## 2024-05-20 DIAGNOSIS — I10 Essential (primary) hypertension: Secondary | ICD-10-CM | POA: Diagnosis not present

## 2024-05-20 DIAGNOSIS — I700 Atherosclerosis of aorta: Secondary | ICD-10-CM | POA: Diagnosis not present

## 2024-05-20 LAB — BAYER DCA HB A1C WAIVED: HB A1C (BAYER DCA - WAIVED): 5.5 % (ref 4.8–5.6)

## 2024-05-20 MED ORDER — METFORMIN HCL ER 500 MG PO TB24
500.0000 mg | ORAL_TABLET | Freq: Every day | ORAL | 1 refills | Status: AC
  Filled 2024-05-20: qty 90, 90d supply, fill #0
  Filled 2024-07-20 – 2024-07-21 (×2): qty 90, 90d supply, fill #1

## 2024-05-20 MED ORDER — OZEMPIC (0.25 OR 0.5 MG/DOSE) 2 MG/3ML ~~LOC~~ SOPN
0.5000 mg | PEN_INJECTOR | SUBCUTANEOUS | 2 refills | Status: DC
  Filled 2024-05-20: qty 9, 84d supply, fill #0

## 2024-05-20 NOTE — Progress Notes (Signed)
 Subjective:    Patient ID: Brenda Meyer, female    DOB: [birth year], 63 y.o.   MRN: [id number]  Chief Complaint  Patient presents with   6 MONTH FOLLOW UP   Pt presents to the office today for  chronic follow up.   Pt is followed by Pulmonologist annually for OSA. She wears her CPAP nightly and doing well.    Followed by GYN annually for pap and mammogram.    Has atherosclerosis of aorta and takes lipitor.   She is currently taking Ozempic  0.5 mg weekly. She has lost  51 lbs. Her starting weight was 223 lbs.      05/20/2024    9:33 AM 11/13/2023    9:04 AM 06/10/2023    9:19 AM  Last 3 Weights  Weight (lbs) 172 lb 6.4 oz 181 lb 3.2 oz 209 lb 12.8 oz  Weight (kg) 78.2 kg 82.192 kg 95.165 kg     Complaining of thoracic back that started 3 weeks ago. Reports she has been taking her mobic  and using a heating pad with mild relief. Reports pain of 5 out 10.  Hypertension This is a chronic problem. The current episode started more than 1 year ago. The problem has been waxing and waning since onset. The problem is uncontrolled. Associated symptoms include anxiety and malaise/fatigue. Pertinent negatives include no blurred vision, peripheral edema or shortness of breath. Risk factors for coronary artery disease include obesity, dyslipidemia and sedentary lifestyle. The current treatment provides moderate improvement. Identifiable causes of hypertension include a thyroid  problem.  Thyroid  Problem Presents for follow-up visit. Symptoms include constipation and fatigue. Patient reports no anxiety, diarrhea or dry skin. The symptoms have been stable.  Diabetes She presents for her follow-up diabetic visit. She has type 2 diabetes mellitus. Pertinent negatives for hypoglycemia include no nervousness/anxiousness. Associated symptoms include fatigue. Pertinent negatives for diabetes include no blurred vision and no foot paresthesias. Symptoms are stable. Pertinent negatives for diabetic  complications include no peripheral neuropathy. Risk factors for coronary artery disease include dyslipidemia, diabetes mellitus, hypertension, sedentary lifestyle and post-menopausal. She is following a generally healthy diet. Her overall blood glucose range is 90-110 mg/dl.  Hyperlipidemia This is a chronic problem. The current episode started more than 1 year ago. The problem is controlled. Recent lipid tests were reviewed and are normal. She has no history of obesity. Pertinent negatives include no shortness of breath. Current antihyperlipidemic treatment includes diet change and statins. The current treatment provides moderate improvement of lipids. Risk factors for coronary artery disease include diabetes mellitus, dyslipidemia, hypertension and a sedentary lifestyle.  Anxiety Presents for follow-up visit. Symptoms include excessive worry, irritability and restlessness. Patient reports no nervous/anxious behavior, shortness of breath or suicidal ideas. Symptoms occur occasionally. The severity of symptoms is mild.    Depression        This is a chronic problem.  The current episode started more than 1 year ago.   Associated symptoms include fatigue, restlessness and sad.  Associated symptoms include no helplessness, no hopelessness and no suicidal ideas.( Irritable )  Past treatments include SNRIs - Serotonin and norepinephrine reuptake inhibitors.  Past medical history includes thyroid  problem and anxiety.   Back Pain This is a chronic problem. The current episode started more than 1 year ago. The problem occurs intermittently. The problem has been waxing and waning since onset. The pain is present in the lumbar spine. The quality of the pain is described as aching. The pain is at a  severity of 3/10. The pain is mild. She has tried home exercises for the symptoms. The treatment provided mild relief.      Review of Systems  Constitutional:  Positive for fatigue, irritability and  malaise/fatigue.  Eyes:  Negative for blurred vision.  Respiratory:  Negative for shortness of breath.   Gastrointestinal:  Positive for constipation. Negative for diarrhea.  Musculoskeletal:  Positive for back pain.  Psychiatric/Behavioral:  Negative for suicidal ideas. The patient is not nervous/anxious.   All other systems reviewed and are negative.  Family History  Problem Relation Age of Onset   Heart disease Mother    Heart disease Father    Brain cancer Brother    Lung cancer Sister    Social History   Socioeconomic History   Marital status: Married    Spouse name: Not on file   Number of children: Not on file   Years of education: Not on file   Highest education level: Bachelor's degree (e.g., BA, AB, BS)  Occupational History   Occupation: nurse    Employer: [employer]  Tobacco Use   Smoking status: Former    Current packs/day: 0.00    Average packs/day: 1 pack/day for 30.0 years (30.0 ttl pk-yrs)    Types: Cigarettes    Start date: 10/02/1980    Quit date: 10/02/2010    Years since quitting: 13.6   Smokeless tobacco: Never  Vaping Use   Vaping status: Never Used  Substance and Sexual Activity   Alcohol use: No   Drug use: Never   Sexual activity: Not on file  Other Topics Concern   Not on file  Social History Narrative   Not on file   Social Drivers of Health   Financial Resource Strain: Low Risk  (05/19/2024)   Overall Financial Resource Strain (CARDIA)    Difficulty of Paying Living Expenses: Not hard at all  Food Insecurity: No Food Insecurity (05/19/2024)   Hunger Vital Sign    Worried About Running Out of Food in the Last Year: Never true    Ran Out of Food in the Last Year: Never true  Transportation Needs: No Transportation Needs (05/19/2024)   PRAPARE - Administrator, Civil Service (Medical): No    Lack of Transportation (Non-Medical): No  Physical Activity: Inactive (05/19/2024)   Exercise Vital Sign    Days of Exercise per Week: 3  days    Minutes of Exercise per Session: 0 min  Stress: Stress Concern Present (05/19/2024)   [employer] of Occupational Health - Occupational Stress Questionnaire    Feeling of Stress: To some extent  Social Connections: Moderately Isolated (05/19/2024)   Social Connection and Isolation Panel    Frequency of Communication with Friends and Family: More than three times a week    Frequency of Social Gatherings with Friends and Family: Once a week    Attends Religious Services: Never    Database administrator or Organizations: No    Attends Engineer, structural: Not on file    Marital Status: Married        Objective:   Physical Exam Vitals reviewed.  Constitutional:      General: She is not in acute distress.    Appearance: She is well-developed.  HENT:     Head: Normocephalic and atraumatic.     Right Ear: Tympanic membrane normal.     Left Ear: Tympanic membrane normal.  Eyes:     Pupils: Pupils are equal, round,  and reactive to light.  Neck:     Thyroid : No thyromegaly.  Cardiovascular:     Rate and Rhythm: Normal rate and regular rhythm.     Heart sounds: Normal heart sounds. No murmur heard. Pulmonary:     Effort: Pulmonary effort is normal. No respiratory distress.     Breath sounds: Normal breath sounds. No wheezing.  Abdominal:     General: Bowel sounds are normal. There is no distension.     Palpations: Abdomen is soft.     Tenderness: There is no abdominal tenderness.  Musculoskeletal:        General: No tenderness. Normal range of motion.     Cervical back: Normal range of motion and neck supple.  Skin:    General: Skin is warm and dry.  Neurological:     Mental Status: She is alert and oriented to person, place, and time.     Cranial Nerves: No cranial nerve deficit.     Deep Tendon Reflexes: Reflexes are normal and symmetric.  Psychiatric:        Behavior: Behavior normal.        Thought Content: Thought content normal.        Judgment:  Judgment normal.         BP (!) 150/84   Pulse 71   Temp 97.8 F (36.6 C)   Ht 5' 5 (1.651 m)   Wt 172 lb 6.4 oz (78.2 kg)   SpO2 98%   BMI 28.69 kg/m   Assessment & Plan:  HERMINE FERIA comes in today with chief complaint of 6 MONTH FOLLOW UP   Diagnosis and orders addressed:  1. OSA (obstructive sleep apnea) - CMP14+EGFR  2. Chronic midline thoracic back pain - CMP14+EGFR  3. Hypothyroidism, unspecified type - CMP14+EGFR - TSH  4. Hyperlipidemia, unspecified hyperlipidemia type - CMP14+EGFR  5. Type 2 diabetes mellitus with other specified complication, without long-term current use of insulin  (HCC) (Primary) - CMP14+EGFR - Bayer DCA Hb A1c Waived - metFORMIN  (GLUCOPHAGE -XR) 500 MG 24 hr tablet; Take 1 tablet (500 mg total) by mouth daily with breakfast.  Dispense: 90 tablet; Refill: 1 - Semaglutide ,0.25 or 0.5MG /DOS, (OZEMPIC , 0.25 OR 0.5 MG/DOSE,) 2 MG/3ML SOPN; Inject 0.5 mg into the skin once a week.  Dispense: 9 mL; Refill: 2  6. Vitamin D  deficiency - CMP14+EGFR  7. Essential hypertension - CMP14+EGFR  8. Overweight (BMI 25.0-29.9) - CMP14+EGFR  9. Depression, major, single episode, mild (HCC) - CMP14+EGFR  10. Atherosclerosis of aorta (HCC) - CMP14+EGFR  11. Chronic idiopathic constipation - CMP14+EGFR  12. GAD (generalized anxiety disorder) - CMP14+EGFR  13. Emphysema with chronic bronchitis (HCC) - CMP14+EGFR    Labs pending Will decrease metformin  to 500 xr from 1000 mg BID  Continue Ozempic  0.5 mg, keep up the great work!!! Continue current medications  Health Maintenance reviewed Diet and exercise encouraged  Follow up plan: 6 months    Bari Learn, FNP

## 2024-05-20 NOTE — Patient Instructions (Signed)

## 2024-05-21 ENCOUNTER — Other Ambulatory Visit: Payer: Self-pay | Admitting: Family

## 2024-05-21 DIAGNOSIS — F320 Major depressive disorder, single episode, mild: Secondary | ICD-10-CM

## 2024-05-21 DIAGNOSIS — F411 Generalized anxiety disorder: Secondary | ICD-10-CM

## 2024-05-21 LAB — CMP14+EGFR
ALT: 12 IU/L (ref 0–32)
AST: 15 IU/L (ref 0–40)
Albumin: 4.3 g/dL (ref 3.9–4.9)
Alkaline Phosphatase: 100 IU/L (ref 49–135)
BUN/Creatinine Ratio: 14 (ref 12–28)
BUN: 8 mg/dL (ref 8–27)
Bilirubin Total: 0.4 mg/dL (ref 0.0–1.2)
CO2: 24 mmol/L (ref 20–29)
Calcium: 10.4 mg/dL — ABNORMAL HIGH (ref 8.7–10.3)
Chloride: 100 mmol/L (ref 96–106)
Creatinine, Ser: 0.57 mg/dL (ref 0.57–1.00)
Globulin, Total: 2.6 g/dL (ref 1.5–4.5)
Glucose: 89 mg/dL (ref 70–99)
Potassium: 4.7 mmol/L (ref 3.5–5.2)
Sodium: 140 mmol/L (ref 134–144)
Total Protein: 6.9 g/dL (ref 6.0–8.5)
eGFR: 101 mL/min/1.73 (ref >59)

## 2024-05-21 LAB — TSH: TSH: 2.53 u[IU]/mL (ref 0.450–4.500)

## 2024-05-23 ENCOUNTER — Other Ambulatory Visit (HOSPITAL_COMMUNITY): Payer: Self-pay

## 2024-05-23 ENCOUNTER — Ambulatory Visit: Payer: Self-pay | Admitting: Family

## 2024-05-23 ENCOUNTER — Other Ambulatory Visit: Payer: Self-pay

## 2024-05-23 MED ORDER — DULOXETINE HCL 60 MG PO CPEP
60.0000 mg | ORAL_CAPSULE | Freq: Every day | ORAL | 1 refills | Status: AC
  Filled 2024-05-23: qty 90, 90d supply, fill #0
  Filled 2024-08-17: qty 90, 90d supply, fill #1

## 2024-05-27 ENCOUNTER — Other Ambulatory Visit (HOSPITAL_COMMUNITY): Payer: Self-pay

## 2024-05-27 ENCOUNTER — Other Ambulatory Visit: Payer: Self-pay

## 2024-05-27 ENCOUNTER — Other Ambulatory Visit: Payer: Self-pay | Admitting: Family

## 2024-05-27 DIAGNOSIS — E7800 Pure hypercholesterolemia, unspecified: Secondary | ICD-10-CM

## 2024-05-27 DIAGNOSIS — E785 Hyperlipidemia, unspecified: Secondary | ICD-10-CM

## 2024-05-27 DIAGNOSIS — E039 Hypothyroidism, unspecified: Secondary | ICD-10-CM

## 2024-05-27 MED ORDER — ATORVASTATIN CALCIUM 20 MG PO TABS
20.0000 mg | ORAL_TABLET | Freq: Every day | ORAL | 1 refills | Status: AC
  Filled 2024-05-27: qty 90, 90d supply, fill #0
  Filled 2024-08-23: qty 90, 90d supply, fill #1

## 2024-05-27 MED ORDER — LEVOTHYROXINE SODIUM 50 MCG PO TABS
50.0000 ug | ORAL_TABLET | Freq: Every day | ORAL | 3 refills | Status: AC
  Filled 2024-05-27: qty 90, 90d supply, fill #0
  Filled 2024-08-24: qty 90, 90d supply, fill #1

## 2024-06-02 ENCOUNTER — Other Ambulatory Visit (HOSPITAL_COMMUNITY): Payer: Self-pay

## 2024-06-02 MED ORDER — WEGOVY 1 MG/0.5ML ~~LOC~~ SOAJ
1.0000 mg | SUBCUTANEOUS | 2 refills | Status: DC
  Filled 2024-06-02: qty 2, 28d supply, fill #0

## 2024-06-07 ENCOUNTER — Other Ambulatory Visit (HOSPITAL_COMMUNITY): Payer: Self-pay

## 2024-06-07 ENCOUNTER — Other Ambulatory Visit: Payer: Self-pay

## 2024-06-07 DIAGNOSIS — E1169 Type 2 diabetes mellitus with other specified complication: Secondary | ICD-10-CM

## 2024-06-07 MED ORDER — SEMAGLUTIDE (1 MG/DOSE) 4 MG/3ML ~~LOC~~ SOPN
1.0000 mg | PEN_INJECTOR | SUBCUTANEOUS | 2 refills | Status: DC
  Filled 2024-06-07: qty 3, 28d supply, fill #0
  Filled 2024-07-01: qty 3, 28d supply, fill #1
  Filled 2024-07-29: qty 3, 28d supply, fill #2

## 2024-06-08 ENCOUNTER — Other Ambulatory Visit: Payer: Self-pay | Admitting: Family

## 2024-06-08 ENCOUNTER — Telehealth: Payer: Self-pay | Admitting: Pharmacy Technician

## 2024-06-08 DIAGNOSIS — G8929 Other chronic pain: Secondary | ICD-10-CM

## 2024-06-08 NOTE — Telephone Encounter (Signed)
 Pharmacy Patient Advocate Encounter   Received notification from CoverMyMeds that prior authorization for Ozempic  (0.25 or 0.5 MG/DOSE) 2MG /3ML pen-injectors is due for renewal.   Insurance verification completed.   The patient is insured through [facility].  Action: Medication has been discontinued. Archived Key: [id number]

## 2024-06-09 ENCOUNTER — Other Ambulatory Visit (HOSPITAL_COMMUNITY): Payer: Self-pay

## 2024-06-09 ENCOUNTER — Other Ambulatory Visit: Payer: Self-pay

## 2024-06-09 MED ORDER — CYCLOBENZAPRINE HCL 10 MG PO TABS
10.0000 mg | ORAL_TABLET | Freq: Every day | ORAL | 2 refills | Status: AC
  Filled 2024-06-09: qty 90, 90d supply, fill #0
  Filled 2024-09-06: qty 90, 90d supply, fill #1

## 2024-06-18 ENCOUNTER — Other Ambulatory Visit: Payer: Self-pay | Admitting: Family

## 2024-06-18 DIAGNOSIS — I10 Essential (primary) hypertension: Secondary | ICD-10-CM

## 2024-06-20 ENCOUNTER — Other Ambulatory Visit: Payer: Self-pay

## 2024-06-20 ENCOUNTER — Other Ambulatory Visit (HOSPITAL_COMMUNITY): Payer: Self-pay

## 2024-06-20 MED ORDER — LOSARTAN POTASSIUM 100 MG PO TABS
100.0000 mg | ORAL_TABLET | Freq: Every day | ORAL | 1 refills | Status: AC
  Filled 2024-06-20: qty 90, 90d supply, fill #0
  Filled 2024-09-14: qty 90, 90d supply, fill #1

## 2024-07-01 ENCOUNTER — Other Ambulatory Visit (HOSPITAL_COMMUNITY): Payer: Self-pay

## 2024-07-15 ENCOUNTER — Ambulatory Visit (HOSPITAL_BASED_OUTPATIENT_CLINIC_OR_DEPARTMENT_OTHER): Admitting: Pulmonary Disease

## 2024-07-15 ENCOUNTER — Encounter (HOSPITAL_BASED_OUTPATIENT_CLINIC_OR_DEPARTMENT_OTHER): Payer: Self-pay | Admitting: Pulmonary Disease

## 2024-07-15 VITALS — BP 134/74 | HR 76 | Ht 65.0 in | Wt 171.7 lb

## 2024-07-15 DIAGNOSIS — J4489 Other specified chronic obstructive pulmonary disease: Secondary | ICD-10-CM | POA: Diagnosis not present

## 2024-07-15 DIAGNOSIS — G4733 Obstructive sleep apnea (adult) (pediatric): Secondary | ICD-10-CM | POA: Diagnosis not present

## 2024-07-15 DIAGNOSIS — J439 Emphysema, unspecified: Secondary | ICD-10-CM

## 2024-07-15 NOTE — Patient Instructions (Signed)
  VISIT SUMMARY: Today, we reviewed your pulmonary nodules and sleep apnea. Your pulmonary nodules remain stable and benign, and your sleep apnea is well-managed with your CPAP therapy. We also discussed your mild emphysema, which is stable and does not require medication at this time.  YOUR PLAN: -OBSTRUCTIVE SLEEP APNEA: Obstructive sleep apnea is a condition where your breathing stops and starts repeatedly during sleep. Your sleep apnea is well-managed with your CPAP therapy, and you are using it nightly with no issues. Your CPAP settings are working effectively, and you have no residual events. You have also lost 58 pounds, which may have positively impacted your condition. Continue using your CPAP therapy with the current settings. If you wish to reassess your sleep apnea status, a home sleep study is available.  -BENIGN PULMONARY NODULES: Pulmonary nodules are small growths in the lungs. Your nodules are stable and benign, with no significant changes in size over the past two years. Given your history of smoking and family history of lung cancer, we will continue annual surveillance with CT scans to monitor the nodules.  -MILD EMPHYSEMA: Emphysema is a lung condition that causes shortness of breath. Your emphysema is mild and likely related to your past smoking history. Your lung function tests show minimal impairment, and the condition is not severe enough to require medication at this time.  INSTRUCTIONS: Continue using your CPAP therapy with the current settings. If you wish to reassess your sleep apnea status, a home sleep study is available. Continue annual surveillance with CT scans to monitor your pulmonary nodules.                      Contains text generated by [employer]bridge.                                 Contains text generated by Abridge.

## 2024-07-15 NOTE — Progress Notes (Signed)
 Subjective:    Patient ID: Brenda Meyer, female    DOB: 08/07/1961, 63 y.o.   MRN: [id number]   63 y.o. former smoker with obstructive sleep apnea and centrilobular emphysema.   PMH : Hypothyroidism, HTN, HLD, DM  30 pack-year smoking , quit 2012   Discussed the use of AI scribe software for clinical note transcription with the patient, who gave verbal consent to proceed.  History of Present Illness Brenda Meyer is a 63 year old female who presents for follow-up of pulmonary nodules and sleep apnea.  She has a history of smoking, having quit in 2011, and is monitored for pulmonary nodules due to this history. Recent CT scans in August 2025 show stable nodules, all less than four millimeters, consistent over the past two years. There is a family history of lung cancer, with her sister and uncle having died from the disease.  She has emphysema, with a breathing test from the previous year showing lung function at 77% of expected, improving to 86% with albuterol . She has lost 58 pounds, which may have positively impacted her lung function.  She was diagnosed with sleep apnea in 2012, with a history of stopping breathing 29 times per hour during sleep. She uses a CPAP machine nightly, with excellent compliance and no residual events. Her CPAP settings are auto-adjusted between 5 to 15 cm H2O, with an average pressure of 11 cm H2O. No issues with the mask and maintains a good seal.       Significant tests/ events reviewed  LDCT chest 04/2024 >> stable LDCT chest 12/08/22 >> coronary calcification, moderate emphysema, pulmonary nodules up to 3.7 cm, granulomas in spleen   PSG 2012 >> AHI 29   PFTs 06/2023 >> FEV1 77%, improves to 86% with BD, nml TLC & DLCO  Review of Systems  neg for any significant sore throat, dysphagia, itching, sneezing, nasal congestion or excess/ purulent secretions, fever, chills, sweats, unintended wt loss, pleuritic or exertional cp, hempoptysis,  orthopnea pnd or change in chronic leg swelling. Also denies presyncope, palpitations, heartburn, abdominal pain, nausea, vomiting, diarrhea or change in bowel or urinary habits, dysuria,hematuria, rash, arthralgias, visual complaints, headache, numbness weakness or ataxia.      Objective:   Physical Exam  Gen. Pleasant, well-nourished, in no distress ENT - no thrush, no pallor/icterus,no post nasal drip Neck: No JVD, no thyromegaly, no carotid bruits Lungs: no use of accessory muscles, no dullness to percussion, clear without rales or rhonchi  Cardiovascular: Rhythm regular, heart sounds  normal, no murmurs or gallops, no peripheral edema Musculoskeletal: No deformities, no cyanosis or clubbing        Assessment & Plan:   Assessment and Plan Assessment & Plan Obstructive sleep apnea, stable on CPAP therapy Obstructive sleep apnea is well-managed with CPAP therapy. She reports excellent compliance with CPAP, using it nightly with no issues. The CPAP report shows auto settings with an average pressure of 11 cm H2O and no residual events, indicating effective control. She has lost 58 pounds since her last sleep study in 2012, which may have impacted her sleep apnea severity. She is accustomed to using the CPAP and does not wish to discontinue it at this time. - Continue CPAP therapy with current settings. - Offered home sleep study if she wishes to reassess sleep apnea status.  Benign pulmonary nodules, stable on annual surveillance Pulmonary nodules are stable and benign, with a maximum volume of 3.7 mm. She has a history of smoking, which  has been discontinued since 2011. Family history of lung cancer is noted, but current imaging shows no significant changes in nodule size over the past two years. The nodules are considered benign and not concerning for malignancy. - Continue annual surveillance with CT scans to monitor pulmonary nodules.  Mild emphysema Likely related to past smoking  history. Pulmonary function tests show minimal impairment, with a FEV1 at 77% and improvement to 86% with albuterol . The condition is not severe enough to warrant medication intervention. - No medication required for emphysema at this time.

## 2024-07-20 ENCOUNTER — Other Ambulatory Visit (HOSPITAL_COMMUNITY): Payer: Self-pay

## 2024-07-20 DIAGNOSIS — Z1231 Encounter for screening mammogram for malignant neoplasm of breast: Secondary | ICD-10-CM | POA: Diagnosis not present

## 2024-07-20 DIAGNOSIS — Z01419 Encounter for gynecological examination (general) (routine) without abnormal findings: Secondary | ICD-10-CM | POA: Diagnosis not present

## 2024-07-20 DIAGNOSIS — Z1331 Encounter for screening for depression: Secondary | ICD-10-CM | POA: Diagnosis not present

## 2024-07-20 LAB — HM MAMMOGRAPHY

## 2024-07-21 ENCOUNTER — Other Ambulatory Visit (HOSPITAL_COMMUNITY): Payer: Self-pay

## 2024-07-29 ENCOUNTER — Other Ambulatory Visit (HOSPITAL_COMMUNITY): Payer: Self-pay

## 2024-08-07 ENCOUNTER — Other Ambulatory Visit (HOSPITAL_COMMUNITY): Payer: Self-pay

## 2024-08-12 ENCOUNTER — Other Ambulatory Visit: Payer: Self-pay | Admitting: Family

## 2024-08-12 ENCOUNTER — Other Ambulatory Visit: Payer: Self-pay

## 2024-08-12 ENCOUNTER — Encounter: Payer: Self-pay | Admitting: Family

## 2024-08-12 MED ORDER — PANTOPRAZOLE SODIUM 20 MG PO TBEC
20.0000 mg | DELAYED_RELEASE_TABLET | Freq: Every day | ORAL | 0 refills | Status: AC
  Filled 2024-08-12: qty 90, 90d supply, fill #0

## 2024-08-12 NOTE — Telephone Encounter (Signed)
 Christy NTBS in March for 6 mos FU RF sent to pharmacy

## 2024-08-12 NOTE — Telephone Encounter (Signed)
 LMTCB to make 6 mth appt w/[facility] around November 17 2024 for med refill. Also, I sent pt a letter about this!

## 2024-08-17 ENCOUNTER — Other Ambulatory Visit (HOSPITAL_COMMUNITY): Payer: Self-pay

## 2024-08-23 ENCOUNTER — Other Ambulatory Visit: Payer: Self-pay

## 2024-08-23 ENCOUNTER — Other Ambulatory Visit (HOSPITAL_COMMUNITY): Payer: Self-pay

## 2024-08-23 DIAGNOSIS — E1169 Type 2 diabetes mellitus with other specified complication: Secondary | ICD-10-CM

## 2024-08-23 MED ORDER — OZEMPIC (1 MG/DOSE) 4 MG/3ML ~~LOC~~ SOPN
1.0000 mg | PEN_INJECTOR | SUBCUTANEOUS | 0 refills | Status: AC
  Filled 2024-08-23: qty 3, 28d supply, fill #0
  Filled 2024-09-21: qty 3, 28d supply, fill #1

## 2024-08-24 ENCOUNTER — Other Ambulatory Visit: Payer: Self-pay

## 2024-08-24 ENCOUNTER — Other Ambulatory Visit (HOSPITAL_COMMUNITY): Payer: Self-pay

## 2024-08-24 ENCOUNTER — Encounter (HOSPITAL_COMMUNITY): Payer: Self-pay

## 2024-08-26 ENCOUNTER — Other Ambulatory Visit (HOSPITAL_COMMUNITY): Payer: Self-pay

## 2024-08-29 ENCOUNTER — Other Ambulatory Visit (HOSPITAL_COMMUNITY): Payer: Self-pay

## 2024-08-29 NOTE — Telephone Encounter (Unsigned)
 Copied from CRM #[id number]. Topic: Clinical - Prescription Issue >> Aug 29, 2024 10:39 AM Diannia H wrote: Reason for CRM: Heather from [facility] called for the message below: Please assist?   Please call to approve the copay of $375.00 for 3 month supply of Ozempic . And we will need to contact your provider to change the manufacturer for the levothyroxine . [facility] [phone]

## 2024-09-05 ENCOUNTER — Other Ambulatory Visit: Payer: Self-pay

## 2024-09-06 ENCOUNTER — Other Ambulatory Visit (HOSPITAL_COMMUNITY): Payer: Self-pay

## 2024-09-06 ENCOUNTER — Other Ambulatory Visit: Payer: Self-pay

## 2024-09-14 ENCOUNTER — Other Ambulatory Visit: Payer: Self-pay

## 2024-09-21 ENCOUNTER — Other Ambulatory Visit (HOSPITAL_COMMUNITY): Payer: Self-pay

## 2024-09-21 ENCOUNTER — Other Ambulatory Visit: Payer: Self-pay

## 2024-09-21 ENCOUNTER — Encounter: Payer: Self-pay | Admitting: Pharmacist

## 2024-09-22 ENCOUNTER — Other Ambulatory Visit: Payer: Self-pay

## 2024-10-07 ENCOUNTER — Other Ambulatory Visit (HOSPITAL_COMMUNITY): Payer: Self-pay

## 2024-11-01 ENCOUNTER — Ambulatory Visit: Admitting: Family
